# Patient Record
Sex: Female | Born: 1980 | Race: White | Hispanic: No | Marital: Single | Smoking: Never smoker
Health system: Southern US, Community
[De-identification: ages and names within clinical notes are randomized; demographics above are authoritative.]

## PROBLEM LIST (undated history)

## (undated) ENCOUNTER — Emergency Department (HOSPITAL_COMMUNITY): Admission: EM | Payer: 59 | Source: Home / Self Care

## (undated) DIAGNOSIS — N390 Urinary tract infection, site not specified: Secondary | ICD-10-CM

## (undated) DIAGNOSIS — F4310 Post-traumatic stress disorder, unspecified: Secondary | ICD-10-CM

## (undated) DIAGNOSIS — E559 Vitamin D deficiency, unspecified: Secondary | ICD-10-CM

## (undated) DIAGNOSIS — K589 Irritable bowel syndrome without diarrhea: Secondary | ICD-10-CM

## (undated) DIAGNOSIS — E119 Type 2 diabetes mellitus without complications: Secondary | ICD-10-CM

## (undated) DIAGNOSIS — G8929 Other chronic pain: Secondary | ICD-10-CM

## (undated) DIAGNOSIS — R0600 Dyspnea, unspecified: Secondary | ICD-10-CM

## (undated) DIAGNOSIS — K829 Disease of gallbladder, unspecified: Secondary | ICD-10-CM

## (undated) DIAGNOSIS — F329 Major depressive disorder, single episode, unspecified: Secondary | ICD-10-CM

## (undated) DIAGNOSIS — R600 Localized edema: Secondary | ICD-10-CM

## (undated) DIAGNOSIS — K859 Acute pancreatitis without necrosis or infection, unspecified: Secondary | ICD-10-CM

## (undated) DIAGNOSIS — N289 Disorder of kidney and ureter, unspecified: Secondary | ICD-10-CM

## (undated) DIAGNOSIS — N200 Calculus of kidney: Secondary | ICD-10-CM

## (undated) DIAGNOSIS — T7422XA Child sexual abuse, confirmed, initial encounter: Secondary | ICD-10-CM

## (undated) DIAGNOSIS — R12 Heartburn: Secondary | ICD-10-CM

## (undated) DIAGNOSIS — E282 Polycystic ovarian syndrome: Secondary | ICD-10-CM

## (undated) DIAGNOSIS — F419 Anxiety disorder, unspecified: Secondary | ICD-10-CM

## (undated) DIAGNOSIS — F319 Bipolar disorder, unspecified: Secondary | ICD-10-CM

## (undated) DIAGNOSIS — Z7289 Other problems related to lifestyle: Secondary | ICD-10-CM

## (undated) DIAGNOSIS — M539 Dorsopathy, unspecified: Secondary | ICD-10-CM

## (undated) DIAGNOSIS — M549 Dorsalgia, unspecified: Secondary | ICD-10-CM

## (undated) DIAGNOSIS — F32A Depression, unspecified: Secondary | ICD-10-CM

## (undated) DIAGNOSIS — G43909 Migraine, unspecified, not intractable, without status migrainosus: Secondary | ICD-10-CM

## (undated) DIAGNOSIS — K5900 Constipation, unspecified: Secondary | ICD-10-CM

## (undated) DIAGNOSIS — M2550 Pain in unspecified joint: Secondary | ICD-10-CM

## (undated) DIAGNOSIS — E669 Obesity, unspecified: Secondary | ICD-10-CM

## (undated) DIAGNOSIS — M797 Fibromyalgia: Secondary | ICD-10-CM

## (undated) DIAGNOSIS — T7840XA Allergy, unspecified, initial encounter: Secondary | ICD-10-CM

## (undated) HISTORY — DX: Irritable bowel syndrome, unspecified: K58.9

## (undated) HISTORY — DX: Disease of gallbladder, unspecified: K82.9

## (undated) HISTORY — DX: Other problems related to lifestyle: Z72.89

## (undated) HISTORY — DX: Major depressive disorder, single episode, unspecified: F32.9

## (undated) HISTORY — DX: Child sexual abuse, confirmed, initial encounter: T74.22XA

## (undated) HISTORY — PX: WISDOM TOOTH EXTRACTION: SHX21

## (undated) HISTORY — PX: OTHER SURGICAL HISTORY: SHX169

## (undated) HISTORY — PX: ANTERIOR FUSION CERVICAL SPINE: SUR626

## (undated) HISTORY — DX: Allergy, unspecified, initial encounter: T78.40XA

## (undated) HISTORY — DX: Fibromyalgia: M79.7

## (undated) HISTORY — DX: Vitamin D deficiency, unspecified: E55.9

## (undated) HISTORY — DX: Bipolar disorder, unspecified: F31.9

## (undated) HISTORY — DX: Pain in unspecified joint: M25.50

## (undated) HISTORY — PX: CHOLECYSTECTOMY: SHX55

## (undated) HISTORY — DX: Post-traumatic stress disorder, unspecified: F43.10

## (undated) HISTORY — DX: Depression, unspecified: F32.A

## (undated) HISTORY — DX: Localized edema: R60.0

## (undated) HISTORY — DX: Calculus of kidney: N20.0

## (undated) HISTORY — DX: Type 2 diabetes mellitus without complications: E11.9

## (undated) HISTORY — DX: Acute pancreatitis without necrosis or infection, unspecified: K85.90

## (undated) HISTORY — PX: LUMBAR MICRODISCECTOMY: SHX99

## (undated) HISTORY — PX: LAPAROSCOPIC SIGMOID COLECTOMY: SHX5928

## (undated) HISTORY — DX: Anxiety disorder, unspecified: F41.9

## (undated) HISTORY — DX: Constipation, unspecified: K59.00

## (undated) HISTORY — DX: Other chronic pain: G89.29

## (undated) HISTORY — DX: Dorsopathy, unspecified: M53.9

## (undated) HISTORY — DX: Heartburn: R12

## (undated) HISTORY — DX: Dyspnea, unspecified: R06.00

## (undated) HISTORY — DX: Migraine, unspecified, not intractable, without status migrainosus: G43.909

## (undated) HISTORY — DX: Dorsalgia, unspecified: M54.9

---
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
## 2008-05-28 ENCOUNTER — Other Ambulatory Visit: Admission: RE | Admit: 2008-05-28 | Discharge: 2008-05-28 | Payer: Self-pay | Admitting: Obstetrics and Gynecology

## 2010-04-12 HISTORY — PX: OTHER SURGICAL HISTORY: SHX169

## 2010-05-03 ENCOUNTER — Encounter: Payer: Self-pay | Admitting: Obstetrics and Gynecology

## 2010-08-05 ENCOUNTER — Emergency Department (HOSPITAL_COMMUNITY): Payer: Managed Care, Other (non HMO)

## 2010-08-05 ENCOUNTER — Inpatient Hospital Stay (HOSPITAL_COMMUNITY)
Admission: EM | Admit: 2010-08-05 | Discharge: 2010-08-12 | DRG: 392 | Disposition: A | Discharge status: Home / Self Care | Payer: Managed Care, Other (non HMO) | Attending: Surgery | Admitting: Surgery

## 2010-08-05 DIAGNOSIS — K5732 Diverticulitis of large intestine without perforation or abscess without bleeding: Principal | ICD-10-CM | POA: Diagnosis present

## 2010-08-05 DIAGNOSIS — R112 Nausea with vomiting, unspecified: Secondary | ICD-10-CM | POA: Diagnosis present

## 2010-08-05 DIAGNOSIS — E119 Type 2 diabetes mellitus without complications: Secondary | ICD-10-CM | POA: Diagnosis present

## 2010-08-05 DIAGNOSIS — K589 Irritable bowel syndrome without diarrhea: Secondary | ICD-10-CM | POA: Diagnosis present

## 2010-08-05 DIAGNOSIS — E282 Polycystic ovarian syndrome: Secondary | ICD-10-CM | POA: Diagnosis present

## 2010-08-05 LAB — DIFFERENTIAL
Basophils Absolute: 0 10*3/uL (ref 0.0–0.1)
Basophils Relative: 0 % (ref 0–1)
Eosinophils Absolute: 0.1 10*3/uL (ref 0.0–0.7)
Eosinophils Relative: 0 % (ref 0–5)
Lymphocytes Relative: 8 % — ABNORMAL LOW (ref 12–46)
Lymphs Abs: 1.4 10*3/uL (ref 0.7–4.0)
Monocytes Absolute: 0.9 10*3/uL (ref 0.1–1.0)
Monocytes Relative: 5 % (ref 3–12)
Neutro Abs: 14.4 10*3/uL — ABNORMAL HIGH (ref 1.7–7.7)
Neutrophils Relative %: 86 % — ABNORMAL HIGH (ref 43–77)

## 2010-08-05 LAB — COMPREHENSIVE METABOLIC PANEL
Alkaline Phosphatase: 48 U/L (ref 39–117)
BUN: 10 mg/dL (ref 6–23)
Calcium: 8.2 mg/dL — ABNORMAL LOW (ref 8.4–10.5)
GFR calc non Af Amer: >60 mL/min (ref >60)
Glucose, Bld: 124 mg/dL — ABNORMAL HIGH (ref 70–99)
Potassium: 3.6 mEq/L (ref 3.5–5.1)
Total Protein: 7 g/dL (ref 6.0–8.3)

## 2010-08-05 LAB — CBC
HCT: 37.9 % (ref 36.0–46.0)
Hemoglobin: 12.7 g/dL (ref 12.0–15.0)
MCH: 28.5 pg (ref 26.0–34.0)
MCHC: 33.5 g/dL (ref 30.0–36.0)
MCV: 85.2 fL (ref 78.0–100.0)
Platelets: 319 10*3/uL (ref 150–400)
RBC: 4.45 MIL/uL (ref 3.87–5.11)
RDW: 14 % (ref 11.5–15.5)
WBC: 16.8 10*3/uL — ABNORMAL HIGH (ref 4.0–10.5)

## 2010-08-05 LAB — LIPASE, BLOOD: Lipase: 24 U/L (ref 11–59)

## 2010-08-05 MED ORDER — IOHEXOL 300 MG/ML  SOLN
100.0000 mL | Freq: Once | INTRAMUSCULAR | Status: AC | PRN
  Administered 2010-08-05: 100 mL via INTRAVENOUS

## 2010-08-06 LAB — CBC
HCT: 35.7 % — ABNORMAL LOW (ref 36.0–46.0)
Hemoglobin: 11.9 g/dL — ABNORMAL LOW (ref 12.0–15.0)
MCH: 28.5 pg (ref 26.0–34.0)
MCV: 85.4 fL (ref 78.0–100.0)
RBC: 4.18 MIL/uL (ref 3.87–5.11)

## 2010-08-06 LAB — URINALYSIS, ROUTINE W REFLEX MICROSCOPIC
Bilirubin Urine: NEGATIVE
Nitrite: NEGATIVE
Protein, ur: NEGATIVE mg/dL
Specific Gravity, Urine: >1.046 — ABNORMAL HIGH (ref 1.005–1.030)
Urobilinogen, UA: 0.2 mg/dL (ref 0.0–1.0)

## 2010-08-06 LAB — URINE MICROSCOPIC-ADD ON

## 2010-08-06 LAB — POCT PREGNANCY, URINE: Preg Test, Ur: NEGATIVE

## 2010-08-06 LAB — GLUCOSE, CAPILLARY: Glucose-Capillary: 112 mg/dL — ABNORMAL HIGH (ref 70–99)

## 2010-08-06 LAB — HEMOGLOBIN A1C
Hgb A1c MFr Bld: 6.1 % — ABNORMAL HIGH (ref <5.7)
Mean Plasma Glucose: 128 mg/dL — ABNORMAL HIGH (ref <117)

## 2010-08-07 LAB — GLUCOSE, CAPILLARY
Glucose-Capillary: 95 mg/dL (ref 70–99)
Glucose-Capillary: 105 mg/dL — ABNORMAL HIGH (ref 70–99)
Glucose-Capillary: 95 mg/dL (ref 70–99)

## 2010-08-07 LAB — BASIC METABOLIC PANEL
CO2: 21 mEq/L (ref 19–32)
Calcium: 8.4 mg/dL (ref 8.4–10.5)
Chloride: 107 mEq/L (ref 96–112)
Creatinine, Ser: 0.69 mg/dL (ref 0.4–1.2)
Glucose, Bld: 80 mg/dL (ref 70–99)

## 2010-08-07 LAB — CBC
HCT: 35.9 % — ABNORMAL LOW (ref 36.0–46.0)
Hemoglobin: 11.7 g/dL — ABNORMAL LOW (ref 12.0–15.0)
MCH: 28.1 pg (ref 26.0–34.0)
MCHC: 32.6 g/dL (ref 30.0–36.0)
RBC: 4.17 MIL/uL (ref 3.87–5.11)

## 2010-08-08 LAB — GLUCOSE, CAPILLARY: Glucose-Capillary: 98 mg/dL (ref 70–99)

## 2010-08-08 LAB — CBC
Hemoglobin: 11.7 g/dL — ABNORMAL LOW (ref 12.0–15.0)
MCH: 27.7 pg (ref 26.0–34.0)
MCHC: 32.3 g/dL (ref 30.0–36.0)
Platelets: 292 10*3/uL (ref 150–400)
RBC: 4.22 MIL/uL (ref 3.87–5.11)

## 2010-08-09 LAB — GLUCOSE, CAPILLARY
Glucose-Capillary: 98 mg/dL (ref 70–99)
Glucose-Capillary: 96 mg/dL (ref 70–99)

## 2010-08-10 LAB — GLUCOSE, CAPILLARY
Glucose-Capillary: 96 mg/dL (ref 70–99)
Glucose-Capillary: 127 mg/dL — ABNORMAL HIGH (ref 70–99)

## 2010-08-10 LAB — CBC
HCT: 36.9 % (ref 36.0–46.0)
Hemoglobin: 12.3 g/dL (ref 12.0–15.0)
MCHC: 33.3 g/dL (ref 30.0–36.0)
MCV: 85 fL (ref 78.0–100.0)
WBC: 6.3 10*3/uL (ref 4.0–10.5)

## 2010-08-11 LAB — GLUCOSE, CAPILLARY
Glucose-Capillary: 106 mg/dL — ABNORMAL HIGH (ref 70–99)
Glucose-Capillary: 98 mg/dL (ref 70–99)

## 2010-08-12 LAB — GLUCOSE, CAPILLARY
Glucose-Capillary: 119 mg/dL — ABNORMAL HIGH (ref 70–99)
Glucose-Capillary: 109 mg/dL — ABNORMAL HIGH (ref 70–99)

## 2010-08-14 NOTE — Discharge Summary (Signed)
  NAME:  Kerri Ford, Kerri Ford             ACCOUNT NO.:  [id number]  MEDICAL RECORD NO.:  [id number]           PATIENT TYPE:  I  LOCATION:  5155                         FACILITY:  [facility]  PHYSICIAN:  Wilmon Arms. Corliss Skains, M.D. DATE OF BIRTH:  1980/11/24  DATE OF ADMISSION:  08/05/2010 DATE OF DISCHARGE:  08/12/2010                              DISCHARGE SUMMARY   HISTORY OF PRESENT ILLNESS:  Kerri Ford is a pleasant 30 year old female who has had prior episodes of diverticulitis, treated with oral antibiotics, however, has never been admitted.  Due to the intensity of her recurrent pain, she came the emergency department where CT scan showed continued evidence of diverticulitis and of questionable small contained perforation.  She had an elevated white count of greater than 16,000.  She does have a history of irritable bowel disease and polycystic ovarian syndrome, but this had no previous admissions.  After being evaluated by Dr. Bertram Savin, decision was made to admit the patient for diverticulitis.  SUMMARY OF HOSPITAL COURSE:  The patient was admitted on August 06, 2010, by Dr. Bertram Savin.  She was placed on IV Cipro and Flagyl and placed at bowel rest.  Spent several days like this before feeling significantly better symptomatically.  She was still having some mild abdominal discomfort even 48 hours after being admitted.  Finally, she started to subside.  She was started on clear liquid diet and gradually advanced from clear liquids to full liquids to regular diet.  She was consulted on by the nutritionist for teaching a low residue diet which she will need to maintain after discharge.  She was switched from IV to p.o. antibiotics consisting of Augmentin and Flagyl.  She had one additional episode of nausea and vomiting with her breakfast when morning that prompted an additional 24 hours of observation, but the patient did well those following 24 hours, and therefore is appropriate for  discharge as of today, Aug 12, 2010.  DISCHARGE DIAGNOSES: 1. Recurrent sigmoid diverticulitis. 2. History of irritable bowel syndrome. 3. Diabetes mellitus. 4. Polycystic ovarian syndrome. 5. Morbid obesity.  DISCHARGE MEDICATIONS:  The patient will resume her home medications including: 1. Loratadine 10 mg daily. 2. Metformin 500 mg twice daily. 3. Trazodone 50 mg daily. 4. Benadryl 25 mg q.6 h p.r.n. 5. Percocet 5/325 one-two tablets q.4 h p.r.n. 6. She is given prescriptions for Augmentin 875 mg 1 tablet twice     daily and Flagyl 500 mg 1 tablet 3 times daily. 7. She is also given prescription Diflucan 150 mg 1 tablet q. 4-5 days     p.r.n. yeast infection.  She is given instructions regarding activity and dietary restrictions and is asked to follow up in our office with either Dr. Donell Beers or Dr. Abbey Chatters in approximately 10-14 days.     Brayton El, PA-C   ______________________________ Wilmon Arms. Corliss Skains, M.D.    Corky Downs  D:  08/12/2010  T:  08/12/2010  Job:  [id number]  Electronically Signed by Brayton El  on 08/13/2010 09:17:03 AM Electronically Signed by Manus Rudd M.D. on 08/14/2010 02:15:05 PM

## 2010-09-17 ENCOUNTER — Encounter (HOSPITAL_COMMUNITY)
Admission: RE | Admit: 2010-09-17 | Discharge: 2010-09-17 | Disposition: A | Discharge status: Home / Self Care | Payer: Managed Care, Other (non HMO) | Source: Ambulatory Visit | Attending: General Surgery | Admitting: General Surgery

## 2010-09-17 LAB — URINALYSIS, ROUTINE W REFLEX MICROSCOPIC
Ketones, ur: NEGATIVE mg/dL
Nitrite: NEGATIVE
Protein, ur: NEGATIVE mg/dL
Urobilinogen, UA: 0.2 mg/dL (ref 0.0–1.0)

## 2010-09-17 LAB — CBC
MCV: 85.8 fL (ref 78.0–100.0)
Platelets: 330 10*3/uL (ref 150–400)
RDW: 14.1 % (ref 11.5–15.5)
WBC: 9.7 10*3/uL (ref 4.0–10.5)

## 2010-09-17 LAB — COMPREHENSIVE METABOLIC PANEL
Albumin: 3.9 g/dL (ref 3.5–5.2)
Alkaline Phosphatase: 59 U/L (ref 39–117)
BUN: 12 mg/dL (ref 6–23)
Chloride: 103 mEq/L (ref 96–112)
Potassium: 4.3 mEq/L (ref 3.5–5.1)
Total Bilirubin: 0.2 mg/dL — ABNORMAL LOW (ref 0.3–1.2)

## 2010-09-17 LAB — DIFFERENTIAL
Basophils Absolute: 0 10*3/uL (ref 0.0–0.1)
Basophils Relative: 0 % (ref 0–1)
Eosinophils Absolute: 0.2 10*3/uL (ref 0.0–0.7)
Eosinophils Relative: 2 % (ref 0–5)

## 2010-09-17 LAB — SURGICAL PCR SCREEN: Staphylococcus aureus: NEGATIVE

## 2010-09-17 LAB — HCG, SERUM, QUALITATIVE: Preg, Serum: NEGATIVE

## 2010-09-17 LAB — PROTIME-INR
INR: 0.97 (ref 0.00–1.49)
Prothrombin Time: 13.1 seconds (ref 11.6–15.2)

## 2010-09-17 LAB — TYPE AND SCREEN

## 2010-09-23 ENCOUNTER — Other Ambulatory Visit (INDEPENDENT_AMBULATORY_CARE_PROVIDER_SITE_OTHER): Payer: Self-pay | Admitting: General Surgery

## 2010-09-23 ENCOUNTER — Inpatient Hospital Stay (HOSPITAL_COMMUNITY)
Admission: RE | Admit: 2010-09-23 | Discharge: 2010-09-27 | DRG: 331 | Disposition: A | Discharge status: Home / Self Care | Payer: Managed Care, Other (non HMO) | Source: Ambulatory Visit | Attending: General Surgery | Admitting: General Surgery

## 2010-09-23 DIAGNOSIS — Z01818 Encounter for other preprocedural examination: Secondary | ICD-10-CM

## 2010-09-23 DIAGNOSIS — K5732 Diverticulitis of large intestine without perforation or abscess without bleeding: Principal | ICD-10-CM | POA: Diagnosis present

## 2010-09-23 DIAGNOSIS — Z01812 Encounter for preprocedural laboratory examination: Secondary | ICD-10-CM

## 2010-09-24 ENCOUNTER — Inpatient Hospital Stay (HOSPITAL_COMMUNITY): Payer: Managed Care, Other (non HMO)

## 2010-09-24 LAB — BASIC METABOLIC PANEL
CO2: 25 mEq/L (ref 19–32)
Chloride: 103 mEq/L (ref 96–112)
GFR calc Af Amer: >60 mL/min (ref >60)
Potassium: 4.2 mEq/L (ref 3.5–5.1)
Sodium: 136 mEq/L (ref 135–145)

## 2010-09-24 LAB — URINALYSIS, ROUTINE W REFLEX MICROSCOPIC
Nitrite: NEGATIVE
Protein, ur: 100 mg/dL — AB
Specific Gravity, Urine: 1.046 — ABNORMAL HIGH (ref 1.005–1.030)
Urobilinogen, UA: 0.2 mg/dL (ref 0.0–1.0)

## 2010-09-24 LAB — URINE MICROSCOPIC-ADD ON

## 2010-09-24 LAB — CBC
HCT: 35.2 % — ABNORMAL LOW (ref 36.0–46.0)
Hemoglobin: 11.8 g/dL — ABNORMAL LOW (ref 12.0–15.0)
MCH: 28.9 pg (ref 26.0–34.0)
MCHC: 33.5 g/dL (ref 30.0–36.0)

## 2010-09-24 LAB — PHOSPHORUS: Phosphorus: 3.1 mg/dL (ref 2.3–4.6)

## 2010-09-24 LAB — MAGNESIUM: Magnesium: 2.3 mg/dL (ref 1.5–2.5)

## 2010-09-25 LAB — BASIC METABOLIC PANEL
BUN: 9 mg/dL (ref 6–23)
CO2: 29 mEq/L (ref 19–32)
GFR calc non Af Amer: >60 mL/min (ref >60)
Glucose, Bld: 101 mg/dL — ABNORMAL HIGH (ref 70–99)
Potassium: 4.2 mEq/L (ref 3.5–5.1)
Sodium: 137 mEq/L (ref 135–145)

## 2010-09-25 LAB — URINE CULTURE
Colony Count: NO GROWTH
Culture  Setup Time: 201206141435
Culture: NO GROWTH

## 2010-09-25 LAB — CBC
HCT: 32.9 % — ABNORMAL LOW (ref 36.0–46.0)
Hemoglobin: 10.6 g/dL — ABNORMAL LOW (ref 12.0–15.0)
MCHC: 32.2 g/dL (ref 30.0–36.0)
RBC: 3.75 MIL/uL — ABNORMAL LOW (ref 3.87–5.11)

## 2010-09-25 LAB — PHOSPHORUS: Phosphorus: 2.6 mg/dL (ref 2.3–4.6)

## 2010-09-27 ENCOUNTER — Emergency Department (HOSPITAL_COMMUNITY): Payer: Managed Care, Other (non HMO)

## 2010-09-27 ENCOUNTER — Inpatient Hospital Stay (HOSPITAL_COMMUNITY)
Admission: EM | Admit: 2010-09-27 | Discharge: 2010-09-30 | DRG: 948 | Disposition: A | Discharge status: Home / Self Care | Payer: Managed Care, Other (non HMO) | Attending: Surgery | Admitting: Surgery

## 2010-09-27 DIAGNOSIS — R109 Unspecified abdominal pain: Secondary | ICD-10-CM | POA: Diagnosis present

## 2010-09-27 DIAGNOSIS — E282 Polycystic ovarian syndrome: Secondary | ICD-10-CM | POA: Diagnosis present

## 2010-09-27 DIAGNOSIS — F341 Dysthymic disorder: Secondary | ICD-10-CM | POA: Diagnosis present

## 2010-09-27 DIAGNOSIS — K589 Irritable bowel syndrome without diarrhea: Secondary | ICD-10-CM | POA: Diagnosis present

## 2010-09-27 DIAGNOSIS — E119 Type 2 diabetes mellitus without complications: Secondary | ICD-10-CM | POA: Diagnosis present

## 2010-09-27 DIAGNOSIS — Y836 Removal of other organ (partial) (total) as the cause of abnormal reaction of the patient, or of later complication, without mention of misadventure at the time of the procedure: Secondary | ICD-10-CM | POA: Diagnosis present

## 2010-09-27 DIAGNOSIS — G8918 Other acute postprocedural pain: Principal | ICD-10-CM | POA: Diagnosis present

## 2010-09-27 DIAGNOSIS — Z91040 Latex allergy status: Secondary | ICD-10-CM

## 2010-09-27 DIAGNOSIS — K5730 Diverticulosis of large intestine without perforation or abscess without bleeding: Secondary | ICD-10-CM | POA: Diagnosis present

## 2010-09-27 DIAGNOSIS — Y92009 Unspecified place in unspecified non-institutional (private) residence as the place of occurrence of the external cause: Secondary | ICD-10-CM

## 2010-09-28 LAB — POCT I-STAT, CHEM 8
Calcium, Ion: 1.17 mmol/L (ref 1.12–1.32)
Creatinine, Ser: 0.8 mg/dL (ref 0.50–1.10)
Glucose, Bld: 113 mg/dL — ABNORMAL HIGH (ref 70–99)
HCT: 38 % (ref 36.0–46.0)
Hemoglobin: 12.9 g/dL (ref 12.0–15.0)
Potassium: 4.4 mEq/L (ref 3.5–5.1)
TCO2: 28 mmol/L (ref 0–100)

## 2010-09-28 LAB — URINALYSIS, ROUTINE W REFLEX MICROSCOPIC
Glucose, UA: NEGATIVE mg/dL
Ketones, ur: NEGATIVE mg/dL
Leukocytes, UA: NEGATIVE
Protein, ur: NEGATIVE mg/dL
Urobilinogen, UA: 0.2 mg/dL (ref 0.0–1.0)

## 2010-09-28 LAB — CBC
HCT: 34.7 % — ABNORMAL LOW (ref 36.0–46.0)
MCHC: 34.3 g/dL (ref 30.0–36.0)
Platelets: 288 10*3/uL (ref 150–400)
RDW: 14 % (ref 11.5–15.5)
WBC: 11.5 10*3/uL — ABNORMAL HIGH (ref 4.0–10.5)

## 2010-09-28 LAB — GLUCOSE, CAPILLARY: Glucose-Capillary: 113 mg/dL — ABNORMAL HIGH (ref 70–99)

## 2010-09-28 LAB — DIFFERENTIAL
Basophils Absolute: 0 10*3/uL (ref 0.0–0.1)
Basophils Relative: 0 % (ref 0–1)
Eosinophils Absolute: 0.1 10*3/uL (ref 0.0–0.7)
Eosinophils Relative: 1 % (ref 0–5)
Lymphocytes Relative: 15 % (ref 12–46)
Monocytes Absolute: 0.6 10*3/uL (ref 0.1–1.0)

## 2010-09-28 NOTE — H&P (Signed)
  NAME:  Kerri Ford, NUSSBAUMER             ACCOUNT NO.:  [id number]  MEDICAL RECORD NO.:  [id number]           PATIENT TYPE:  LOCATION:                                 FACILITY:  PHYSICIAN:  Lennie Muckle, MD      DATE OF BIRTH:  08-18-1980  DATE OF ADMISSION:  08/05/2010 DATE OF DISCHARGE:                             HISTORY & PHYSICAL   DIAGNOSIS:  Diverticulitis.  The patient is a 30 year old female who apparently had onset of left lower abdominal pain on Wednesday morning.  The pain was located in the left lower quadrant.  The pain was severe.  She had associated nausea, but no emesis.  No diarrhea.  She had some chills.  She had had a previous history of diverticulitis, treated with oral antibiotics in the past.  Due to the intensity of pain, she came to the emergency department.  A CT scan was obtained, which revealed a diverticulitis, inflammatory changes, and small perforation was contained.  She did have an elevated white count of 16.8.  She states she has had five episodes within the past 5 years.  She does have irritable bowel syndrome, but has not had any previous admissions.  PAST MEDICAL HISTORY:  Morbid obesity, polycystic ovarian syndrome, questionable diabetes.  She states she is prediabetic.  She does have irritable bowel syndrome.  SURGICAL HISTORY:  Cholecystectomy, right ankle surgery.  MEDICATIONS:  Metformin.  ALLERGIES:  LATEX causes rash.  REVIEW OF SYSTEMS:  Negative.  PHYSICAL EXAMINATION:  GENERAL:  She is a morbidly obese female, lying in bed, in no acute distress. VITAL SIGNS:  Temperature is 99.1, pulse 99, blood pressure 139/82. HEENT:  Normocephalic.  Extraocular muscles are intact.  Pupils are equal and round.  Sclerae and conjunctivae are clear.  Nares are clear without drainage.  Oral mucosa is pink and moist. NECK:  No tenderness.  Normal range of motion. CHEST:  Clear to auscultation bilaterally.  Normal expansion  and excursion. CARDIOVASCULAR:  Regular rate and rhythm.  No murmurs, gallops, or rubs. Pulses are palpated in the upper extremities. ABDOMEN:  Obese.  Examination is severely limited due to body habitus, but she has some tenderness in the left lower quadrant.  No rebound tenderness.  No peritoneal signs. MUSCULOSKELETAL:  No pain with palpation of the joints.  Normal range of motion. NEUROLOGIC:  Grossly normal. PSYCHOLOGIC:  Normal.  ASSESSMENT AND PLAN:  Diverticulitis with perforation.  We will admit. IV antibiotics.  Continue serial examinations for change in examination. Currently, there is no acute need for surgical intervention.  I have discussed the possibility of surgery with __________ if examination does change.  She is somewhat limited due to her body habitus.  We will continue most of her home medications and place her on Cipro and Flagyl. Maintain n.p.o. status.     Lennie Muckle, MD     ALA/MEDQ  D:  08/06/2010  T:  08/06/2010  Job:  [id number]  cc:   Pearline Cables, MD  Electronically Signed by Bertram Savin MD on 09/28/2010 12:02:30 PM

## 2010-09-29 ENCOUNTER — Inpatient Hospital Stay (HOSPITAL_COMMUNITY): Payer: Managed Care, Other (non HMO)

## 2010-09-29 LAB — GLUCOSE, CAPILLARY
Glucose-Capillary: 84 mg/dL (ref 70–99)
Glucose-Capillary: 100 mg/dL — ABNORMAL HIGH (ref 70–99)
Glucose-Capillary: 105 mg/dL — ABNORMAL HIGH (ref 70–99)

## 2010-09-29 LAB — CBC
MCHC: 33.5 g/dL (ref 30.0–36.0)
RDW: 13.9 % (ref 11.5–15.5)
WBC: 12.5 10*3/uL — ABNORMAL HIGH (ref 4.0–10.5)

## 2010-09-29 MED ORDER — IOHEXOL 300 MG/ML  SOLN
80.0000 mL | Freq: Once | INTRAMUSCULAR | Status: AC | PRN
  Administered 2010-09-29: 80 mL via INTRAVENOUS

## 2010-09-30 LAB — GLUCOSE, CAPILLARY
Glucose-Capillary: 97 mg/dL (ref 70–99)
Glucose-Capillary: 108 mg/dL — ABNORMAL HIGH (ref 70–99)

## 2010-09-30 LAB — CBC
MCHC: 34.2 g/dL (ref 30.0–36.0)
RDW: 14 % (ref 11.5–15.5)

## 2010-10-07 ENCOUNTER — Telehealth (INDEPENDENT_AMBULATORY_CARE_PROVIDER_SITE_OTHER): Payer: Self-pay | Admitting: General Surgery

## 2010-10-07 NOTE — Telephone Encounter (Signed)
Pt called - stated has appt Friday / just concerned that staples might need come out sooner/ stated a friend (doctor ) look at and said was not infected . Pt has no fever . She had been using peroxide  So i instructed pt to discontinue peroxide and just keep clean and dry until Friday and will call if symptoms  Get worse/ [phone]--- 06/27/12e3h

## 2010-10-09 ENCOUNTER — Encounter (INDEPENDENT_AMBULATORY_CARE_PROVIDER_SITE_OTHER): Payer: Self-pay | Admitting: General Surgery

## 2010-10-09 ENCOUNTER — Ambulatory Visit (INDEPENDENT_AMBULATORY_CARE_PROVIDER_SITE_OTHER): Payer: Managed Care, Other (non HMO) | Admitting: General Surgery

## 2010-10-09 VITALS — HR 72 | Temp 98.0°F | Wt 259.6 lb

## 2010-10-09 DIAGNOSIS — K5732 Diverticulitis of large intestine without perforation or abscess without bleeding: Secondary | ICD-10-CM

## 2010-10-09 NOTE — Assessment & Plan Note (Signed)
Staples removed. Approved for driving. No lifting over 20 pounds for 6 weeks after surgery. Follow up PRN.

## 2010-10-09 NOTE — Progress Notes (Signed)
Subjective:     Patient ID: Kerri Ford, female   DOB: [birth year], 30 y.o.   MRN: [id number]    Pulse 72  Temp 98 F (36.7 C)  Wt 259 lb 9.6 oz (117.754 kg)  LMP 09/24/2010    HPI  The patient is doing very well after her laparoscopic sigmoid colectomy. She had to be admitted once while I was on vacation for ileus and constipation. She has continued to wean her narcotics and is only taking around 1-2 per day. She has not taken any today. She is walking multiple times per day. Her appetite is slowly improving. Her energy level continues to improve. She denies any fevers, chills, nausea, vomiting. She has had some irritation from the staple sites. Review of Systems     Objective:   Physical Exam In general she is oriented x3 in no acute distress Abdomen soft nontender nondistended. Staple sites are inflamed and the staples are removed and replaced with Steri-Strips.    Assessment:       Plan:

## 2010-10-11 NOTE — Op Note (Signed)
NAME:  Kerri Ford, Kerri Ford             ACCOUNT NO.:  [id number]  MEDICAL RECORD NO.:  [id number]  LOCATION:  5127                         FACILITY:  [facility]  PHYSICIAN:  Almond Lint, MD       DATE OF BIRTH:  [birth year]  DATE OF PROCEDURE:  09/23/2010 DATE OF DISCHARGE:                              OPERATIVE REPORT   PREOPERATIVE DIAGNOSIS:  Recurrent diverticulitis.  POSTOPERATIVE DIAGNOSIS:  Recurrent diverticulitis.  PROCEDURE:  Laparoscopic sigmoid colectomy with splenic flexure takedown.  SURGEON:  Almond Lint, MD  ASSISTANT:  Derrell Lolling.  ANESTHESIA:  General and local.  FINDINGS:  Sigmoid diverticulitis.  SPECIMENS:  Sigmoid to Pathology.  ESTIMATED BLOOD LOSS:  Minimal.  COMPLICATIONS:  None known.  ESTIMATED BLOOD LOSS:  100 mL.  COMPLICATIONS:  None known.  PROCEDURE IN DETAILS:  Kerri Ford was identified in the holding area and taken to the operating room where she was placed supine on the operating room table.  General anesthesia was induced.  She was placed into the low lithotomy position and a Foley catheter was placed.  Her abdomen and perineum were prepped and draped in sterile fashion.  Time- out was performed according to the surgical safety check list.  When all was correct we continued.  The infraumbilical skin was anesthetized with local anesthetic and a vertical incision was made with #11 blade.  The Kerri Ford was used to spread the subcutaneous tissues and the midline fascia was elevated with 2 Kocher clamps.  The fascia was incised in the midline with #11 blade and a 0 Vicryl pursestring suture was placed around the incision.  The Hasson trocar was advanced into the abdomen and pneumoperitoneum was achieved to a pressure of 15 mmHg.  The patient was placed in Trendelenburg position and rotated to the left.  Three 5-mm trocars were placed under direct visualization after administration of local, one in the left lower quadrant, one in the right lower  quadrant, and one in the suprapubic region.  The sigmoid was identified and the attachments to the abdominal wall were originally taken down with scissors.  There was chronic inflammation in this area. The white line of Toldt was taken down with the scissors as well.  Once we got closer down to the line of Toldt at the sigmoid, the Harmonic was used.  Blunt dissection was used as well.  The ureter was identified, visualized and held down.  The tissue above that was quite inflamed was taken out to the lateral abdominal wall.  The sigmoid inferiorly was quite mobile, so attention was directed superiorly.  The splenic flexure was taken down by going all the way up to the white line of Toldt then taking down the splenic colic ligament.  The omentum was taken off of the splenic flexure as well and the splenic flexure was reflected medially.  The posterior attachments were taken down with a combination of blunt dissection with LigaSure.  Once adequate length had been obtained to pull this down, the sigmoid was held with a locking grasper and a midline incision was made for an elective wound retractor.  This was around 10-12 cm in length.  The skin was incised with a #10 blade and  the cautery was used to divide the subcutaneous tissues.  Fascia was opened with the cautery and taken down to the length of skin incision. The peritoneum was opened to the full length of the incision as well. The sigmoid was pulled out into the incision and the appropriate sites for division were identified.  Proximal to the inflammation of the diverticuli, area was selected in the distal descending colon.  The mesentery was divided bluntly and then this was stapled with the GIA 75. A Tanja Port was used to hold the tinea epiploica so that the colon did not retract through the abdomen.  Distally, the proximal rectum was identified where the tinea had started to fan out and there were no additional diverticuli present.   This was divided as well with a GIA 75. The mesocolon was divided with the LigaSure.  The two ends of the colon were pulled together with stay sutures and the stapled off ends were opened.  A single layer interrupted 3-0 Vicryl anastomosis was created in an end-to-end fashion.  The rectum was insufflated with a spring clamp proximal to the anastomosis.  This was done under a layer of irrigation so that we could test for bubbles.  There was no evidence of leak present.  The air was allowed to evacuate from the rectum.  The abdomen was irrigated and gloves were changed of the surgeon's and assistant's.  Majority of the instruments were taken away.  A Seprafilm was placed into the abdomen and the On-Q pain pump catheters were placed in the preperitoneal space.  The peritoneum was then closed with a 0 running Vicryl.  The fascia was then closed with #1 loop PDS sutures. The skin was irrigated copiously and closed with staples.  Prior to complete closure of the midline incision, the Hasson site was closed and there was no evidence of fascial defect.  The abdomen was reinsufflated and there was no evidence of bleeding in any of the 4 quadrants.  There was no evidence of pulling blood.  There was no other gross pathology noted.  The pneumoperitoneum was allowed to evacuate and the remaining trocars were pulled under direct visualization without bleeding from the abdominal wall.  Remainder of the incisions were closed with staples and then cleaned, dried and dressed with gauze and Tegaderm.  The patient was awakened from anesthesia and taken to PACU in stable condition. Needle, sponge and instrument counts were correct.     Almond Lint, MD     FB/MEDQ  D:  09/23/2010  T:  09/24/2010  Job:  [id number]  Electronically Signed by Almond Lint MD on 10/11/2010 12:45:10 PM

## 2010-10-20 NOTE — H&P (Signed)
NAME:  Kerri Ford, Kerri Ford NO.:  [id number]  MEDICAL RECORD NO.:  [id number]  LOCATION:  [facility]                         FACILITY:  [facility]  PHYSICIAN:  Velora Heckler, MD      DATE OF BIRTH:  Feb 05, 1981  DATE OF ADMISSION:  09/27/2010 DATE OF DISCHARGE:                             HISTORY & PHYSICAL   REASON FOR EVALUATION:  Abdominal pain out of control.  BRIEF HISTORY:  The patient is a 29 year old female who underwent sigmoid colectomy September 23, 2010 by Dr. Almond Lint.  She was discharged home yesterday.  Since discharge, she reports the pain was severe.  She could not take it at home.  She felt dizzy when she got up to walk, felt anxious  and  was worried if she had ever done something to hurt her surgical site.  She came back to the ER and was treated with p.o. and IV Dilaudid and Ambien and she has had a good night, but continues to feel her pain is out of control.  She also noted pain while trying to void and have a bowel movement.  PAST MEDICAL HISTORY: 1. Recurrent diverticulitis. 2. Irritable bowel syndrome. 3. Adult-onset diabetes mellitus, non-insulin dependent. 4. History of polycystic ovarian syndrome. 5. Morbid obesity.  PAST SURGICAL HISTORY: 1. Cholecystectomy in 2005. 2. History of a right ankle surgery.  FAMILY HISTORY:  Noncontributory.  SOCIAL HISTORY:  Tobacco none.  Alcohol rare.  Drugs none.  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  REVIEW OF SYSTEMS:  Ten-point review was negative except for some anxiety and depression, dizziness, and presyncope when she was up walking.  CURRENT MEDICATIONS:  Her discharge instruction sheet lists: 1. Percocet 1-2 q.4 h. P.r.n. 2. Vitamin B complex with C 1 daily. 3. Tylenol 500 mg 1-2 q.4 p.r.n. 4. Trazodone 50 mg at bedtime. 5. Potassium citrate 1 daily. 6. Metformin 500 mg b.i.d. 7. Magnesium oxide 1 p.o. daily. 8. Benadryl 25 mg 2 tablets q.6 p.r.n. 9. Azo Yeast OTC 1-3  tablets daily as needed.  ALLERGIES: 1. LATEX causes redness, itching and a cough. 2. ARTIFICIAL SWEETENERS "tears up her stomach."  PHYSICAL EXAMINATION:  GENERAL:  This is an obese white female, in no acute distress. VITAL SIGNS:  Temperature on admission was 99.4, heart rate 109, blood pressure 132/82, respiratory rate is 20, sats of 99% on room air. HEENT:  Normocephalic.  Ears, nose, throat and mouth within normal limits. NECK:  Trachea is in the midline.  Thyroid is nonpalpable. CHEST:  Clear to auscultation and percussion.  Nontender. CARDIAC:  Normal S1 and S2.  No murmur or rub. ABDOMEN:  Bowel sounds are somewhat hypoactive.  She is not distended. She is slightly tender.  Incision staples are okay with some slight erythema but nothing abnormal. GU/RECTAL:  Deferred. LYMPHADENOPATHY:  None palpated. MUSCULOSKELETAL:  Normal. SKIN:  Normal. NEUROLOGIC:  Cranial nerves are intact.  No focal deficits. PSYCH:  Normal.  She is slightly anxious.  LABORATORY DATA:  White count is 11.5, hemoglobin is 11.9, hematocrit is 34.7, platelets are 288,000.  Sodium is 137, potassium is 4.4, chloride is 99, BUN is 8, creatinine is 0.8, glucose  is 113.  IMPRESSION: 1. Status post sigmoid colectomy September 23, 2010.  Discharged yesterday,     September 27, 2010 with poor pain control, now readmitted. 2. Adult-onset diabetes mellitus. 3. Anxiety/depression. 4. Morbid obesity.  Height 63 inches, weight 266 pounds.  PLAN:  We will admit her, work on her pain control, and mobilize. Further treatment as indicated.     Eber Hong, P.A.   ______________________________ Velora Heckler, MD    WDJ/MEDQ  D:  09/28/2010  T:  09/28/2010  Job:  [id number]  cc:   Georgian Co, PA  Electronically Signed by Sherrie George P.A. on 10/05/2010 04:54:09 PM Electronically Signed by Darnell Level MD on 10/20/2010 10:57:57 AM

## 2010-12-03 NOTE — Discharge Summary (Signed)
  NAME:  Kerri Ford, Kerri Ford             ACCOUNT NO.:  [id number]  MEDICAL RECORD NO.:  [id number]  LOCATION:                                 FACILITY:  PHYSICIAN:  Almond Lint, MD       DATE OF BIRTH:  1980-04-26  DATE OF ADMISSION:  09/23/2010 DATE OF DISCHARGE:  09/23/2010                              DISCHARGE SUMMARY   PRINCIPAL DIAGNOSIS:  Recurrent diverticulitis.  SECONDARY DIAGNOSES:  Polycystic ovarian syndrome, obesity.  PRINCIPAL LABS:  Discharge hematocrit 39, blood sugars l30.  DISCHARGE MEDICATIONS: 1. Metformin 500 mg twice a day. 2. Potassium citrate 1 daily. 3. Magnesium oxide 1 p.o. daily. 4. Vitamin B complex with vitamin C once a day. 5. Trazodone 50 mg at bedtime. 6. Benadryl as needed. 7. Azo Yeast as needed. 8. Percocet 5/325, 1-2 tabs p.o. q.4 h. as needed for pain.  HOSPITAL COURSE:  [name] is admitted to the hospital following her laparoscopic sigmoid colectomy.  She did a good job ambulating.  She was on PCA and had On-Q for pain control.  Her Foley catheter was removed.  She was able to void spontaneously.  She had good bowel function and was able to tolerate a regular diet.  She was placed on Percocet and her pain was tolerable.  She was discharged to home in improved condition.  She is instructed not to do any heavy lifting for 6 weeks.  She can shower but not bathe.  Regular diet.  Follow up with me in 2-3 weeks.     Almond Lint, MD     FB/MEDQ  D:  11/26/2010  T:  11/26/2010  Job:  [id number]  Electronically Signed by Almond Lint MD on 12/03/2010 02:37:32 PM

## 2011-06-02 ENCOUNTER — Other Ambulatory Visit: Payer: Self-pay | Admitting: Physician Assistant

## 2011-06-02 MED ORDER — TRAZODONE HCL 50 MG PO TABS: ORAL_TABLET | ORAL | Status: DC

## 2011-06-02 MED ORDER — BUPROPION HCL ER (SR) 150 MG PO TB12: ORAL_TABLET | ORAL | Status: DC

## 2011-06-02 MED ORDER — METFORMIN HCL 500 MG PO TABS: 500.0000 mg | ORAL_TABLET | Freq: Two times a day (BID) | ORAL | Status: DC

## 2011-08-10 ENCOUNTER — Ambulatory Visit: Payer: Self-pay | Admitting: Physician Assistant

## 2011-08-10 VITALS — BP 112/75 | HR 103 | Temp 98.2°F | Resp 18 | Ht 64.0 in | Wt 280.0 lb

## 2011-08-10 DIAGNOSIS — R110 Nausea: Secondary | ICD-10-CM

## 2011-08-10 DIAGNOSIS — E282 Polycystic ovarian syndrome: Secondary | ICD-10-CM

## 2011-08-10 DIAGNOSIS — R112 Nausea with vomiting, unspecified: Secondary | ICD-10-CM

## 2011-08-10 DIAGNOSIS — F419 Anxiety disorder, unspecified: Secondary | ICD-10-CM

## 2011-08-10 DIAGNOSIS — R197 Diarrhea, unspecified: Secondary | ICD-10-CM

## 2011-08-10 MED ORDER — BUPROPION HCL ER (SR) 150 MG PO TB12: ORAL_TABLET | ORAL | Status: DC

## 2011-08-10 MED ORDER — CLONAZEPAM 0.5 MG PO TABS: ORAL_TABLET | ORAL | Status: DC

## 2011-08-11 DIAGNOSIS — E282 Polycystic ovarian syndrome: Secondary | ICD-10-CM | POA: Insufficient documentation

## 2011-08-11 NOTE — Progress Notes (Signed)
  Subjective:    Patient ID: Kerri Ford, female    DOB: October 06, 1980, 31 y.o.   MRN: [id number]  HPI 31 yr old CF presents with N/V/D all day today.  No f/c.  Abs sore from heaving. No dysuria or frequency.  Also updates me on other things in her life.  She has continued counseling since seeing me last summer and is having major breakthroughs.  She has great support system inplace through her church.  She has had a few panic attacks related to some painful memories resurfacing and would like to have something on hand if she experiences these.   She admits to poor diet and plans to get back on track with that  She denies SI/HI.    She is working for  A financial group entering data.  Doesn't have health insurance currently.  Review of Systems  All other systems reviewed and are negative.       Objective:   Physical Exam  Nursing note and vitals reviewed. Constitutional: She appears well-developed and well-nourished.       Obese, lying on table, appears ill, but not toxic.  HENT:  Head: Normocephalic and atraumatic.  Mouth/Throat: Oropharynx is clear and moist. No oropharyngeal exudate.  Neck: Normal range of motion. Neck supple.  Cardiovascular: Normal rate, regular rhythm and normal heart sounds.   Pulmonary/Chest: Effort normal and breath sounds normal.  Abdominal: Soft. Bowel sounds are normal. She exhibits no distension and no mass. There is no tenderness. There is no rebound and no guarding.  Skin: Skin is warm and dry.  Psychiatric: She has a normal mood and affect.   Gave 2 p.o. Zofran 4mg  at beginning of visit       Assessment & Plan:  Kerri Ford has resolved upon end of visit, but I did give her #6 phenergan from our stock bottle to have on hand 1 tab every 6 hrs as needed for nausea.  She can try immodium if needed. Discussed hydration at length. Rest X24 hrs. Anxiety/panic attacks-Increase wellbutrin to 1 1/2 tabs qd and clonazepam was given to have  on hand.

## 2011-08-13 ENCOUNTER — Telehealth: Payer: Self-pay

## 2011-08-13 NOTE — Telephone Encounter (Signed)
PATIENT STATES SHE WAS SEEN TUES. BY ANGELA MCCLUNG FOR A STOMACH VIRUS. TODAY HER STOMACH IS STILL GASSY AND SHE HAS THIS TERRIBLE DIARRHEA. SHE ALSO DOES NOT FEEL LIKE EATING ANYTHING. WHAT SHOULD SHE DO NEXT? BEST PHONE [phone] (CELL)    PHARMACY CHOICE IS [facility] ([address])       [facility]

## 2011-08-15 NOTE — Telephone Encounter (Signed)
Spoke to patient. She is improving. I did give her the recommendations from [facility].

## 2011-08-15 NOTE — Telephone Encounter (Signed)
It's a good sign that the nausea has resolved, typically the diarrhea is the last to resolve.  Try OTC simethicone (the product in Mylanta [employer]) to reduce the gas and discomfort from that.  As long as she stays well hydrated, she doesn't need to eat, but her stools won't normalize until she can resume a more normal (for her) diet. It is ok to take a dose of Immodium if she's having frequent diarrhea, such that she's having trouble making it to the bathroom.

## 2011-12-29 ENCOUNTER — Ambulatory Visit (INDEPENDENT_AMBULATORY_CARE_PROVIDER_SITE_OTHER): Payer: 59 | Admitting: Physician Assistant

## 2011-12-29 ENCOUNTER — Encounter: Payer: Self-pay | Admitting: Physician Assistant

## 2011-12-29 VITALS — BP 128/78 | HR 111 | Temp 98.7°F | Resp 16 | Ht 64.0 in | Wt 284.0 lb

## 2011-12-29 DIAGNOSIS — E282 Polycystic ovarian syndrome: Secondary | ICD-10-CM

## 2011-12-29 DIAGNOSIS — F419 Anxiety disorder, unspecified: Secondary | ICD-10-CM

## 2011-12-29 DIAGNOSIS — L293 Anogenital pruritus, unspecified: Secondary | ICD-10-CM

## 2011-12-29 DIAGNOSIS — Z01419 Encounter for gynecological examination (general) (routine) without abnormal findings: Secondary | ICD-10-CM

## 2011-12-29 DIAGNOSIS — Z0000 Encounter for general adult medical examination without abnormal findings: Secondary | ICD-10-CM

## 2011-12-29 DIAGNOSIS — F411 Generalized anxiety disorder: Secondary | ICD-10-CM

## 2011-12-29 DIAGNOSIS — E669 Obesity, unspecified: Secondary | ICD-10-CM

## 2011-12-29 DIAGNOSIS — N898 Other specified noninflammatory disorders of vagina: Secondary | ICD-10-CM

## 2011-12-29 LAB — LIPID PANEL
Cholesterol: 183 mg/dL (ref 0–200)
HDL: 42 mg/dL (ref >39)
LDL Cholesterol: 104 mg/dL — ABNORMAL HIGH (ref 0–99)
Triglycerides: 187 mg/dL — ABNORMAL HIGH (ref <150)

## 2011-12-29 LAB — POCT URINALYSIS DIPSTICK
Blood, UA: NEGATIVE
Nitrite, UA: NEGATIVE
Protein, UA: NEGATIVE
Spec Grav, UA: >=1.03
Urobilinogen, UA: 0.2
pH, UA: 5

## 2011-12-29 LAB — COMPREHENSIVE METABOLIC PANEL
AST: 20 U/L (ref 0–37)
BUN: 10 mg/dL (ref 6–23)
Calcium: 9.4 mg/dL (ref 8.4–10.5)
Chloride: 105 mEq/L (ref 96–112)
Creat: 0.68 mg/dL (ref 0.50–1.10)
Glucose, Bld: 96 mg/dL (ref 70–99)

## 2011-12-29 LAB — POCT WET PREP WITH KOH
KOH Prep POC: NEGATIVE
RBC Wet Prep HPF POC: NEGATIVE
WBC Wet Prep HPF POC: NEGATIVE
Yeast Wet Prep HPF POC: NEGATIVE

## 2011-12-29 LAB — CBC WITH DIFFERENTIAL/PLATELET
Basophils Absolute: 0 10*3/uL (ref 0.0–0.1)
Eosinophils Relative: 3 % (ref 0–5)
HCT: 42 % (ref 36.0–46.0)
Lymphocytes Relative: 22 % (ref 12–46)
MCHC: 34.3 g/dL (ref 30.0–36.0)
MCV: 85.2 fL (ref 78.0–100.0)
Monocytes Absolute: 0.4 10*3/uL (ref 0.1–1.0)
Monocytes Relative: 5 % (ref 3–12)
RDW: 13.7 % (ref 11.5–15.5)

## 2011-12-29 MED ORDER — TRAZODONE HCL 50 MG PO TABS: ORAL_TABLET | ORAL | Status: DC

## 2011-12-29 MED ORDER — CLONAZEPAM 0.5 MG PO TABS: ORAL_TABLET | ORAL | Status: DC

## 2011-12-29 MED ORDER — BUPROPION HCL ER (SR) 150 MG PO TB12: ORAL_TABLET | ORAL | Status: DC

## 2011-12-29 MED ORDER — METFORMIN HCL 500 MG PO TABS: 500.0000 mg | ORAL_TABLET | Freq: Two times a day (BID) | ORAL | Status: DC

## 2011-12-29 MED ORDER — KETOCONAZOLE 2 % EX CREA: TOPICAL_CREAM | Freq: Every day | CUTANEOUS | Status: DC

## 2011-12-30 ENCOUNTER — Encounter: Payer: Self-pay | Admitting: Physician Assistant

## 2011-12-30 LAB — PAP IG W/ RFLX HPV ASCU

## 2011-12-30 LAB — TSH: TSH: 2.142 u[IU]/mL (ref 0.350–4.500)

## 2011-12-30 NOTE — Progress Notes (Signed)
  Subjective:    Patient ID: Kerri Ford, female    DOB: 07/04/1980, 31 y.o.   MRN: [id number]  HPI 31 yr old CF presents for CPE and f/up on meds. She is currently taking 1 wellbutrin daily.  She has taken <12 clonazepam since I last saw her(april, 2013). She is doing well overall.  Her counselor had a baby and wont be going back into practice for now so she has a new counselor    Review of Systems  All other systems reviewed and are negative.       Objective:   Physical Exam  Nursing note and vitals reviewed. Constitutional: She is oriented to person, place, and time. She appears well-developed and well-nourished.       obese  HENT:  Head: Normocephalic and atraumatic.  Left Ear: External ear normal.  Mouth/Throat: Oropharynx is clear and moist. No oropharyngeal exudate.  Eyes: Conjunctivae normal and EOM are normal. Pupils are equal, round, and reactive to light.  Neck: Normal range of motion. Neck supple. No thyromegaly present.  Cardiovascular: Normal rate, regular rhythm and intact distal pulses.  Exam reveals no gallop and no friction rub.   No murmur heard. Pulmonary/Chest: Effort normal and breath sounds normal.  Abdominal: Soft. Bowel sounds are normal.       Exam limited by obesity  Genitourinary: Vagina normal and uterus normal. No vaginal discharge found.       Only able to visualized superior portion of the cervix.  Blind swab into os. Bimanual exam unremarkable.  Musculoskeletal: Normal range of motion.  Lymphadenopathy:    She has no cervical adenopathy.  Neurological: She is alert and oriented to person, place, and time.  Skin: Skin is warm and dry.  Psychiatric: She has a normal mood and affect. Her behavior is normal. Thought content normal.       Assessment & Plan:  CPE and gyn exam PCOS-checking labs, and needs to work on weight loss, exercise, decrease carbs. Anxiety-she can have prescriptions of clonazepam #30 every 3 months. Continue current  meds.

## 2011-12-31 DIAGNOSIS — F418 Other specified anxiety disorders: Secondary | ICD-10-CM | POA: Insufficient documentation

## 2012-03-09 ENCOUNTER — Ambulatory Visit (INDEPENDENT_AMBULATORY_CARE_PROVIDER_SITE_OTHER): Payer: 59 | Admitting: Physician Assistant

## 2012-03-09 VITALS — BP 116/76 | HR 100 | Temp 98.2°F | Resp 18 | Ht 64.0 in | Wt 279.4 lb

## 2012-03-09 DIAGNOSIS — J329 Chronic sinusitis, unspecified: Secondary | ICD-10-CM

## 2012-03-09 DIAGNOSIS — E8881 Metabolic syndrome: Secondary | ICD-10-CM

## 2012-03-09 DIAGNOSIS — R52 Pain, unspecified: Secondary | ICD-10-CM

## 2012-03-09 LAB — POCT CBC
Hemoglobin: 14 g/dL (ref 12.2–16.2)
MCH, POC: 28.7 pg (ref 27–31.2)
MID (cbc): 0.5 (ref 0–0.9)
MPV: 8.7 fL (ref 0–99.8)
POC Granulocyte: 5.5 (ref 2–6.9)
POC MID %: 5.5 %M (ref 0–12)
Platelet Count, POC: 353 10*3/uL (ref 142–424)
RBC: 4.88 M/uL (ref 4.04–5.48)

## 2012-03-09 MED ORDER — FLUTICASONE PROPIONATE 50 MCG/ACT NA SUSP: 2.0000 | Freq: Every day | NASAL | Status: DC

## 2012-03-09 MED ORDER — AMOXICILLIN 500 MG PO CAPS: ORAL_CAPSULE | ORAL | Status: DC

## 2012-03-09 NOTE — Progress Notes (Signed)
  Subjective:    Patient ID: Kerri Ford, female    DOB: August 02, 1980, 31 y.o.   MRN: [id number]  HPI Kerri Ford presents today not feeling well for about 5 days. She has sinus congestion and swelling, ear congestion, achey all over.  No fever. No cough.   She's having a lot of feelings come up with it being the holidays.  She is coping well and continuing with her newer counselor and has decided she thinks her new counselor is going to work out!!   Not doing well with the amount of sugar she has been eating this week.  She has read the schwarzbein principle.    Review of Systems  All other systems reviewed and are negative.       Objective:   Physical Exam  Nursing note and vitals reviewed. Constitutional: She is oriented to person, place, and time. She appears well-developed and well-nourished.  HENT:  Head: Normocephalic and atraumatic.  Right Ear: External ear normal.  Left Ear: External ear normal.  Mouth/Throat: Oropharynx is clear and moist. No oropharyngeal exudate.       Nose with turbinate swelling and yellowish rhinorrhea.  Neck: Normal range of motion. Neck supple. No thyromegaly present.  Cardiovascular: Normal rate, regular rhythm and normal heart sounds.   Pulmonary/Chest: Effort normal and breath sounds normal.       Recheck pulse ox 99%  Lymphadenopathy:    She has no cervical adenopathy.  Neurological: She is alert and oriented to person, place, and time.  Skin: Skin is warm and dry.  Psychiatric: She has a normal mood and affect. Her behavior is normal. Judgment and thought content normal.     Results for orders placed in visit on 03/09/12  POCT CBC      Component Value Range   WBC 8.2  4.6 - 10.2 K/uL   Lymph, poc 2.2  0.6 - 3.4   POC LYMPH PERCENT 27.3  10 - 50 %L   MID (cbc) 0.5  0 - 0.9   POC MID % 5.5  0 - 12 %M   POC Granulocyte 5.5  2 - 6.9   Granulocyte percent 67.2  37 - 80 %G   RBC 4.88  4.04 - 5.48 M/uL   Hemoglobin 14.0  12.2 - 16.2 g/dL   HCT, POC 30.8  65.7 - 47.9 %   MCV 91.1  80 - 97 fL   MCH, POC 28.7  27 - 31.2 pg   MCHC 31.5 (*) 31.8 - 35.4 g/dL   RDW, POC 84.6     Platelet Count, POC 353  142 - 424 K/uL   MPV 8.7  0 - 99.8 fL        Assessment & Plan:  Sinusitis-see Rxs.  Add mucinex D. Fluids, rest. Spent >45 mins face to face counseling. Insulin resistance.  I printed out her labs and we reviewed the importance of healthy diet and exercise.

## 2012-03-29 ENCOUNTER — Ambulatory Visit (INDEPENDENT_AMBULATORY_CARE_PROVIDER_SITE_OTHER): Payer: 59 | Admitting: Physician Assistant

## 2012-03-29 ENCOUNTER — Encounter: Payer: Self-pay | Admitting: Physician Assistant

## 2012-03-29 VITALS — BP 120/84 | HR 80 | Temp 99.2°F | Resp 16 | Ht 64.5 in | Wt 277.6 lb

## 2012-03-29 DIAGNOSIS — F329 Major depressive disorder, single episode, unspecified: Secondary | ICD-10-CM

## 2012-03-29 DIAGNOSIS — E8881 Metabolic syndrome: Secondary | ICD-10-CM

## 2012-04-01 NOTE — Progress Notes (Signed)
  Subjective:    Patient ID: Kerri Ford, female    DOB: May 23, 1980, 31 y.o.   MRN: [id number]  HPI 31 yr old CF presents for a check up for insulin resistance.  She is finally compliant with her  Metformin bid.  She has gone to an OA meeting this past weekend.  She doesn't feel like she has certain "trigger" foods, but she does admit to "medicating with food."  She is having a difficult time with it being the holidays.  She is still actively seeing her counselor. She denies SI/HI.  Review of Systems  All other systems reviewed and are negative.       Objective:   Physical Exam  Nursing note and vitals reviewed. Constitutional: She is oriented to person, place, and time. She appears well-developed and well-nourished.  HENT:  Head: Normocephalic and atraumatic.  Neck: Normal range of motion.  Cardiovascular: Normal rate, regular rhythm and normal heart sounds.   Pulmonary/Chest: Effort normal and breath sounds normal.  Neurological: She is alert and oriented to person, place, and time.  Skin: Skin is warm and dry.  Psychiatric: She has a normal mood and affect. Her behavior is normal.      Assessment & Plan:  Insulin resistance-I see no need to perform labs today bc they they will be "normal" due to her being on medication. I think it will be more beneficial to do labs after she has made some lifestyle changes and has some weight loss.   Anxiety/depression/food addiction/obesity-She will continue doing OA at least 1X per week for now.  She will continue counseling.  She is to write 5 affirmations daily, research benefits of lemon water and work rigorously on dietary changes and exercise. I spent 50 mins face to face with patient.

## 2012-05-03 ENCOUNTER — Telehealth: Payer: Self-pay | Admitting: *Deleted

## 2012-05-03 ENCOUNTER — Other Ambulatory Visit: Payer: Self-pay | Admitting: *Deleted

## 2012-05-03 MED ORDER — BUPROPION HCL ER (SR) 150 MG PO TB12: ORAL_TABLET | ORAL | Status: DC

## 2012-05-03 MED ORDER — METFORMIN HCL 500 MG PO TABS: 500.0000 mg | ORAL_TABLET | Freq: Two times a day (BID) | ORAL | Status: DC

## 2012-05-03 NOTE — Telephone Encounter (Signed)
[facility]

## 2012-05-03 NOTE — Telephone Encounter (Signed)
[facility] a refill on bupropion sr 150mg  90 day supply

## 2012-05-20 ENCOUNTER — Ambulatory Visit (INDEPENDENT_AMBULATORY_CARE_PROVIDER_SITE_OTHER): Payer: 59 | Admitting: Family Medicine

## 2012-05-20 VITALS — BP 135/81 | HR 94 | Temp 98.0°F | Resp 18 | Ht 64.25 in | Wt 273.2 lb

## 2012-05-20 DIAGNOSIS — E282 Polycystic ovarian syndrome: Secondary | ICD-10-CM

## 2012-05-20 DIAGNOSIS — J329 Chronic sinusitis, unspecified: Secondary | ICD-10-CM

## 2012-05-20 DIAGNOSIS — Z79899 Other long term (current) drug therapy: Secondary | ICD-10-CM

## 2012-05-20 LAB — POCT GLYCOSYLATED HEMOGLOBIN (HGB A1C): Hemoglobin A1C: 5.7

## 2012-05-20 MED ORDER — AMOXICILLIN 500 MG PO CAPS: 1000.0000 mg | ORAL_CAPSULE | Freq: Three times a day (TID) | ORAL | Status: DC

## 2012-05-20 MED ORDER — FLUCONAZOLE 150 MG PO TABS: 150.0000 mg | ORAL_TABLET | Freq: Once | ORAL | Status: DC

## 2012-05-20 NOTE — Progress Notes (Signed)
Subjective:    Patient ID: Kerri Ford, female    DOB: 1980/10/17, 32 y.o.   MRN: [id number]   Chief Complaint  Patient presents with  . Sore Throat  . Chills  . Ear Fullness    HPI Has not been able to get rid of sinus infection in past 3 mos. Pharyngitis with crying hard, better w/ hot drinks, lots of blood in nasal congestion.  Did not take the amox she was rx'ed before for sinusitis as she was hoping to avoid the antiiotic due to severe yeast infections that always accompany abx use.  Had to miss work this past wk for 2d  No longer using flonase as she always gets a headache 30 minutes after using it. Doesn't like nasal sprays - bothers her when things go up her nose  Has had 5 rounds of diverticulitis (and hospitalized prev after divertic rupture) and has had 2 rounds of stomach virus - diarrhea after severe lower abdominal pain for 24 hrs several wks ago, then again on 2days prev w/ epigastric pain and ended up with emesis and diarrhea and resolving but still not feeling great and bowels irreg.   Having a lot of mood swings - depression worse.  She has started with overeaters' anonymous and is working closely with her sponsor but it is very difficult - she is being told things she already knows but it makes her really angry when people point out her flaws and the changes she has to make. However, she does feel like she is doing less emotional eating or eating less huge amounts of junk when upset.  Past Medical History  Diagnosis Date  . Anxiety   . Depression   . Allergy    Current Outpatient Prescriptions on File Prior to Visit  Medication Sig Dispense Refill  . Brewers Yeast (YEAST EXTRACT PO) Take by mouth.      Marland Kitchen buPROPion (WELLBUTRIN SR) 150 MG 12 hr tablet 2 daily  180 tablet  1  . cetirizine (ZYRTEC) 10 MG tablet Take 10 mg by mouth daily.      . clonazePAM (KLONOPIN) 0.5 MG tablet 1/2-1 tablet twice daily prn anxiety  30 tablet  0  . diphenhydrAMINE (BENADRYL) 25 MG  tablet Take 25 mg by mouth every 6 (six) hours as needed.      Marland Kitchen ketoconazole (NIZORAL) 2 % cream Apply topically daily.  15 g  0  . metFORMIN (GLUCOPHAGE) 500 MG tablet Take 1 tablet (500 mg total) by mouth 2 (two) times daily with a meal.  180 tablet  0  . Multiple Vitamins-Minerals (WOMENS MULTI VITAMIN & MINERAL PO) Take by mouth.      . Pyridoxine HCl (VITAMIN B-6) 250 MG tablet Take 250 mg by mouth daily.      . traZODone (DESYREL) 50 MG tablet 1 to 3 tabs hs prn sleep  90 tablet  1   No current facility-administered medications on file prior to visit.   Allergies  Allergen Reactions  . Latex Itching and Cough     Review of Systems  Constitutional: Positive for chills and fatigue. Negative for fever, diaphoresis, activity change, appetite change and unexpected weight change.  HENT: Positive for ear pain, nosebleeds, congestion, sore throat, rhinorrhea, sneezing, postnasal drip and sinus pressure. Negative for trouble swallowing, neck pain, neck stiffness, voice change and ear discharge.   Eyes: Negative for discharge and itching.  Respiratory: Positive for cough. Negative for shortness of breath.   Cardiovascular: Negative for chest  pain.  Gastrointestinal: Positive for nausea, vomiting, abdominal pain and diarrhea. Negative for abdominal distention.  Skin: Negative for rash.  Neurological: Positive for headaches. Negative for dizziness and syncope.  Hematological: Positive for adenopathy.  Psychiatric/Behavioral: Positive for behavioral problems, sleep disturbance and dysphoric mood. Negative for hallucinations and confusion.      BP 135/81  Pulse 94  Temp(Src) 98 F (36.7 C) (Oral)  Resp 18  Ht 5' 4.25" (1.632 m)  Wt 273 lb 3.2 oz (123.923 kg)  BMI 46.53 kg/m2  SpO2 97% Objective:   Physical Exam  Constitutional: She is oriented to person, place, and time. She appears well-developed and well-nourished. She appears ill. No distress.  HENT:  Head: Normocephalic and  atraumatic.  Right Ear: External ear and ear canal normal. Tympanic membrane is retracted. A middle ear effusion is present.  Left Ear: External ear and ear canal normal. Tympanic membrane is retracted. A middle ear effusion is present.  Nose: Mucosal edema and rhinorrhea present. Right sinus exhibits maxillary sinus tenderness. Left sinus exhibits maxillary sinus tenderness.  Mouth/Throat: Uvula is midline and mucous membranes are normal. Posterior oropharyngeal erythema present. No oropharyngeal exudate, posterior oropharyngeal edema or tonsillar abscesses.  Eyes: Conjunctivae are normal. Right eye exhibits no discharge. Left eye exhibits no discharge. No scleral icterus.  Neck: Normal range of motion. Neck supple.  Cardiovascular: Normal rate, regular rhythm, normal heart sounds and intact distal pulses.   Pulmonary/Chest: Effort normal and breath sounds normal.  Lymphadenopathy:       Head (right side): Submandibular adenopathy present. No preauricular and no posterior auricular adenopathy present.       Head (left side): Submandibular adenopathy present. No preauricular and no posterior auricular adenopathy present.    She has no cervical adenopathy.       Right: No supraclavicular adenopathy present.       Left: No supraclavicular adenopathy present.  Neurological: She is alert and oriented to person, place, and time.  Skin: Skin is warm and dry. She is not diaphoretic. No erythema.  Psychiatric: She has a normal mood and affect. Her behavior is normal.       Results for orders placed in visit on 05/20/12  POCT GLYCOSYLATED HEMOGLOBIN (HGB A1C)      Result Value Range   Hemoglobin A1C 5.7      Assessment & Plan:   1. PCOS (polycystic ovarian syndrome)    2. Morbid obesity with BMI of 45.0-49.9, adult  POCT glycosylated hemoglobin (Hb A1C)    Pt was delighted to hear that her hgba1c was NORMAL as developing DM is one of her worst fears - marks true failure for her. I checked it to  see if there was any room to increase her metformin dose or even consider byetta/victoza to aide w/ weightloss if pt had a pre-DM but she does not.  Pt is going to continue in [facility]vereat[employer]'s [employer] for now but may need to consider surgical intervention in the future as long as she was involved in therapy. Currently she is very proud of herself for her current weightloss over the past 2 mos and plans to continue. POCT glycosylated hemoglobin (Hb A1C)  3. Encounter for long-term (current) use of other medications    4. Sinusitis - Cont steam, humidifier, unfortunately, she can't tolerate nasal sprays or netti pot/sinus rinse. Try to stay away from sudafed type products due to BP. amoxicillin (AMOXIL) 500 MG capsule   amoxicillin (AMOXIL) 500 MG capsule   fluconazole (DIFLUCAN) 150  MG tablet   DISCONTINUED: amoxicillin (AMOXIL) 500 MG capsule   DISCONTINUED: fluconazole (DIFLUCAN) 150 MG tablet  Pt is going to f/u w/ her PCP Marylene Land in another month and hopes to have lost additional weight by then which she fully expects to be able to do - esp with adding in some exercise. Planning to do additional labs at that appt.  Always gets severe vag candidiasis w/ abx so ok to rx fluconazole. Meds ordered this encounter  Medications  . amoxicillin (AMOXIL) 500 MG capsule    Sig: Take 2 capsules (1,000 mg total) by mouth 3 (three) times daily.    Dispense:  60 capsule    Refill:  0    Order Specific Question:      Answer:    . fluconazole (DIFLUCAN) 150 MG tablet    Sig: Take 1 tablet (150 mg total) by mouth once. Repeat every 3d while on antibiotic needed    Dispense:  4 tablet    Refill:  0

## 2012-06-02 ENCOUNTER — Ambulatory Visit (INDEPENDENT_AMBULATORY_CARE_PROVIDER_SITE_OTHER): Payer: 59 | Admitting: Physician Assistant

## 2012-06-02 VITALS — BP 116/84 | HR 91 | Temp 98.2°F | Resp 18 | Ht 64.0 in | Wt 274.2 lb

## 2012-06-02 DIAGNOSIS — B356 Tinea cruris: Secondary | ICD-10-CM

## 2012-06-02 DIAGNOSIS — J019 Acute sinusitis, unspecified: Secondary | ICD-10-CM

## 2012-06-02 DIAGNOSIS — F411 Generalized anxiety disorder: Secondary | ICD-10-CM

## 2012-06-02 MED ORDER — MOMETASONE FUROATE 50 MCG/ACT NA SUSP: 2.0000 | Freq: Every day | NASAL | Status: DC

## 2012-06-02 MED ORDER — KETOCONAZOLE 2 % EX CREA: TOPICAL_CREAM | Freq: Every day | CUTANEOUS | Status: DC

## 2012-06-02 NOTE — Progress Notes (Signed)
  Subjective:    Patient ID: Kerri Ford, female    DOB: [birth year], 32 y.o.   MRN: [id number]  HPI See last note.  Patient was here 2 weeks ago and treated for sinusitis.  She continues to have some sinus drainage and congestion with occasional cough.  She sleeps with a heater blowing on her at night.  She isn't using a nasal spray right now. She says that flonase gives her a headache. She uses a netti pot when she remembers.  We had initiated treatment of tinea cruris when I saw her in December, but she didn't use it for 6 weeks as directed. So the area on her inner thigh and labia majora.  She is continuing to go to OA and she has lost 20 pounds since she started going. She now has an OA sponsor and she is working the steps.    Review of Systems  All other systems reviewed and are negative.       Objective:   Physical Exam  Nursing note and vitals reviewed. Constitutional: She is oriented to person, place, and time. She appears well-developed and well-nourished.  HENT:  Head: Normocephalic and atraumatic.  Right Ear: External ear normal.  Left Ear: External ear normal.  B TM full, no infection. Turbinates pale and boggy.  Neck: Normal range of motion. Neck supple.  Cardiovascular: Normal rate, regular rhythm and normal heart sounds.   Pulmonary/Chest: Effort normal and breath sounds normal.  Neurological: She is alert and oriented to person, place, and time.  Skin: Skin is warm and dry.  Psychiatric: She has a normal mood and affect. Her behavior is normal.       Assessment & Plan:  Sinus inflammation-I do not think she has any infection at this point but rather her sinuses are inflamed from the heater blowing on her at night and seasonal changes.  Increase water intake. Cold air humidifier at night would be helpful.  Try nasonex.  Tinea cruris-nizoral Continue OA.  See me next month as planned.

## 2012-06-08 ENCOUNTER — Ambulatory Visit (INDEPENDENT_AMBULATORY_CARE_PROVIDER_SITE_OTHER): Payer: 59 | Admitting: Family Medicine

## 2012-06-08 VITALS — BP 128/83 | HR 91 | Temp 99.0°F | Resp 20 | Ht 64.0 in | Wt 275.0 lb

## 2012-06-08 DIAGNOSIS — R05 Cough: Secondary | ICD-10-CM

## 2012-06-08 DIAGNOSIS — R6883 Chills (without fever): Secondary | ICD-10-CM

## 2012-06-08 DIAGNOSIS — J069 Acute upper respiratory infection, unspecified: Secondary | ICD-10-CM

## 2012-06-08 LAB — POCT CBC
HCT, POC: 42.2 % (ref 37.7–47.9)
Hemoglobin: 13.1 g/dL (ref 12.2–16.2)
Lymph, poc: 2.4 (ref 0.6–3.4)
MCH, POC: 28.1 pg (ref 27–31.2)
MCHC: 31 g/dL — AB (ref 31.8–35.4)
MPV: 9 fL (ref 0–99.8)
POC MID %: 4.6 %M (ref 0–12)
RBC: 4.66 M/uL (ref 4.04–5.48)
WBC: 10.4 10*3/uL — AB (ref 4.6–10.2)

## 2012-06-08 LAB — POCT INFLUENZA A/B: Influenza A, POC: NEGATIVE

## 2012-06-08 LAB — GLUCOSE, POCT (MANUAL RESULT ENTRY): POC Glucose: 94 mg/dl (ref 70–99)

## 2012-06-08 MED ORDER — HYDROCOD POLST-CHLORPHEN POLST 10-8 MG/5ML PO LQCR: 5.0000 mL | Freq: Two times a day (BID) | ORAL | Status: DC | PRN

## 2012-06-08 NOTE — Progress Notes (Signed)
[address]   [place], [place]  [address]   [phone]  Subjective:    Patient ID: Kerri Ford, female    DOB: 1980-12-12, 32 y.o.   MRN: [id number]  HPI This 32 y.o. female presents for evaluation of flu like symptoms.  Onset today.  +chills/sweats.  +feverish.  +HA intermittent.  +diarrhea.  No ear pain; no sore throat.  +rhinorrhea; +nasal congestion.  +coughing.  +sneezing.  No SOB.  +diarrhea x 2 today.  S/p ten day regimen of Amoxicillin.  +body aches new today.  Took Nyquil last night; Sudafed 120mg  earlier today.  Works as Automotive engineer entry.  No tobacco.  No asthma.   No flu shot this season.   Review of Systems  Constitutional: Positive for fever, chills and fatigue. Negative for diaphoresis.  HENT: Positive for congestion, rhinorrhea, sneezing, postnasal drip and sinus pressure. Negative for ear pain, sore throat, mouth sores, trouble swallowing and voice change.   Respiratory: Positive for cough. Negative for shortness of breath, wheezing and stridor.   Gastrointestinal: Positive for diarrhea. Negative for nausea, vomiting and abdominal pain.  Skin: Negative for rash.  Neurological: Positive for headaches. Negative for dizziness and light-headedness.        Past Medical History  Diagnosis Date  . Anxiety   . Depression   . Allergy     Past Surgical History  Procedure Laterality Date  . Cholecystectomy    . Sigmoid colectomy  2012  . Dislocated ankle      Prior to Admission medications   Medication Sig Start Date End Date Taking? Authorizing Provider  buPROPion [facility] SR) 150 MG 12 hr tablet 2 daily 05/03/12  Yes Eleanore E Egan, PA-C  cetirizine (ZYRTEC) 10 MG tablet Take 10 mg by mouth daily.   Yes Historical Provider, MD  clonazePAM (KLONOPIN) 0.5 MG tablet 1/2-1 tablet twice daily prn anxiety 12/29/11  Yes Anders Simmonds, PA-C  diphenhydrAMINE (BENADRYL) 25 MG tablet Take 25 mg by mouth every 6 (six) hours as needed.   Yes Historical Provider, MD    ketoconazole (NIZORAL) 2 % cream Apply topically daily. 06/02/12  Yes Anders Simmonds, PA-C  metFORMIN (GLUCOPHAGE) 500 MG tablet Take 1 tablet (500 mg total) by mouth 2 (two) times daily with a meal. 05/03/12  Yes Eleanore Delia Chimes, PA-C  Multiple Vitamins-Minerals (WOMENS MULTI VITAMIN & MINERAL PO) Take by mouth.   Yes Historical Provider, MD  pseudoephedrine (SUDAFED) 120 MG 12 hr tablet Take 120 mg by mouth every 12 (twelve) hours.   Yes Historical Provider, MD  Pyridoxine HCl (VITAMIN B-6) 250 MG tablet Take 250 mg by mouth daily.   Yes Historical Provider, MD  traZODone (DESYREL) 50 MG tablet 1 to 3 tabs hs prn sleep 12/29/11  Yes Marzella Schlein McClung, PA-C  mometasone (NASONEX) 50 MCG/ACT nasal spray Place 2 sprays into the nose daily. 06/02/12   Anders Simmonds, PA-C    Allergies  Allergen Reactions  . Fluticasone Propionate     Headache with nasal spray  . Latex Itching and Cough    History   Social History  . Marital Status: Single    Spouse Name: N/A    Number of Children: N/A  . Years of Education: N/A   Occupational History  . Not on file.   Social History Main Topics  . Smoking status: Never Smoker   . Smokeless tobacco: Not on file  . Alcohol Use: Yes     Comment: rare  .  Drug Use: No  . Sexually Active: No   Other Topics Concern  . Not on file   Social History Narrative  . No narrative on file    Family History  Problem Relation Age of Onset  . Diabetes Father     Objective:   Physical Exam  Nursing note and vitals reviewed. Constitutional: She is oriented to person, place, and time. She appears well-developed and well-nourished. No distress.  HENT:  Head: Normocephalic and atraumatic.  Right Ear: External ear normal.  Left Ear: External ear normal.  Nose: Mucosal edema and rhinorrhea present. Right sinus exhibits maxillary sinus tenderness. Right sinus exhibits no frontal sinus tenderness. Left sinus exhibits maxillary sinus tenderness. Left sinus  exhibits no frontal sinus tenderness.  Mouth/Throat: Oropharynx is clear and moist.  Eyes: Conjunctivae are normal. Pupils are equal, round, and reactive to light.  Neck: Normal range of motion. Neck supple.  Cardiovascular: Normal rate, regular rhythm and normal heart sounds.   No murmur heard. Pulmonary/Chest: Effort normal and breath sounds normal. She has no wheezes. She has no rales.  Lymphadenopathy:    She has cervical adenopathy.  Neurological: She is alert and oriented to person, place, and time. No cranial nerve deficit.  Skin: Skin is warm and dry. No rash noted. She is not diaphoretic.  Psychiatric: She has a normal mood and affect. Her behavior is normal.   Results for orders placed in visit on 06/08/12  POCT CBC      Result Value Range   WBC 10.4 (*) 4.6 - 10.2 K/uL   Lymph, poc 2.4  0.6 - 3.4   POC LYMPH PERCENT 23.1  10 - 50 %L   MID (cbc) 0.5  0 - 0.9   POC MID % 4.6  0 - 12 %M   POC Granulocyte 7.5 (*) 2 - 6.9   Granulocyte percent 72.3  37 - 80 %G   RBC 4.66  4.04 - 5.48 M/uL   Hemoglobin 13.1  12.2 - 16.2 g/dL   HCT, POC 62.1  30.8 - 47.9 %   MCV 90.6  80 - 97 fL   MCH, POC 28.1  27 - 31.2 pg   MCHC 31.0 (*) 31.8 - 35.4 g/dL   RDW, POC 65.7     Platelet Count, POC 371  142 - 424 K/uL   MPV 9.0  0 - 99.8 fL  POCT INFLUENZA A/B      Result Value Range   Influenza A, POC Negative     Influenza B, POC Negative    GLUCOSE, POCT (MANUAL RESULT ENTRY)      Result Value Range   POC Glucose 94  70 - 99 mg/dl       Assessment & Plan:  Chills - Plan: POCT CBC, POCT Influenza A/B, POCT glucose (manual entry)  Acute upper respiratory infections of unspecified site  Cough - Plan: POCT CBC, POCT Influenza A/B, POCT glucose (manual entry)   1. URI: New.  Versus influenza infection.  Recommend supportive care with rest, fluids, Tylenol or Motrin. Continue Pseudoephedrine, Nyquil.  Rx for Tussionex provided for cough.  Advised no work for next 72 hours.  RTC for  acute worsening or persistent symptoms.    Meds ordered this encounter  Medications  . chlorpheniramine-HYDROcodone (TUSSIONEX PENNKINETIC ER) 10-8 MG/5ML LQCR    Sig: Take 5 mLs by mouth every 12 (twelve) hours as needed.    Dispense:  240 mL    Refill:  0

## 2012-06-08 NOTE — Patient Instructions (Addendum)
Chills - Plan: POCT CBC, POCT Influenza A/B, POCT glucose (manual entry)  Acute upper respiratory infections of unspecified site  Cough - Plan: POCT CBC, POCT Influenza A/B, POCT glucose (manual entry)

## 2012-06-17 ENCOUNTER — Telehealth: Payer: Self-pay

## 2012-06-17 NOTE — Telephone Encounter (Signed)
spoke to patient advised this is not normal she has nasal drainage. Cough better when using cough meds. She is very congested. Feels like she may be getting sinus infection. Please advise. Does she need to return to clnic? Or can we try an antibiotic for this?

## 2012-06-17 NOTE — Telephone Encounter (Signed)
PT WAS DIAGNOSED WITH THE FLU ALMOST 10 DAYS AGO.   SAYS THE COUGH/DRAINAGE STILL CONTINUES AND SHE REALLY IS NOT FEELING BETTER.  WANTS TO KNOW IF THIS IS NORMAL.  SAYS SHE HAS CAME IN SEVERAL TIMES IN THE LAST FEW MONTHS.  [phone]

## 2012-06-17 NOTE — Telephone Encounter (Signed)
I see that she was here for 3 visits in February.  However, Dr. Michaelle Copas instructions were to return for acute worsening or persistent symptoms.

## 2012-06-19 NOTE — Telephone Encounter (Signed)
Called her to advise. She will come in today.  

## 2012-06-28 ENCOUNTER — Ambulatory Visit (INDEPENDENT_AMBULATORY_CARE_PROVIDER_SITE_OTHER): Payer: 59 | Admitting: Physician Assistant

## 2012-06-28 ENCOUNTER — Encounter: Payer: Self-pay | Admitting: Physician Assistant

## 2012-06-28 VITALS — BP 110/72 | HR 78 | Temp 98.3°F | Resp 16 | Ht 64.0 in | Wt 274.6 lb

## 2012-06-28 DIAGNOSIS — F411 Generalized anxiety disorder: Secondary | ICD-10-CM

## 2012-06-28 DIAGNOSIS — L680 Hirsutism: Secondary | ICD-10-CM

## 2012-06-28 DIAGNOSIS — E8881 Metabolic syndrome: Secondary | ICD-10-CM

## 2012-06-28 LAB — CBC WITH DIFFERENTIAL/PLATELET
Basophils Absolute: 0 10*3/uL (ref 0.0–0.1)
HCT: 39.9 % (ref 36.0–46.0)
Lymphocytes Relative: 29 % (ref 12–46)
Lymphs Abs: 2 10*3/uL (ref 0.7–4.0)
Neutro Abs: 4.3 10*3/uL (ref 1.7–7.7)
Platelets: 325 10*3/uL (ref 150–400)
RBC: 4.9 MIL/uL (ref 3.87–5.11)
RDW: 14.1 % (ref 11.5–15.5)
WBC: 6.9 10*3/uL (ref 4.0–10.5)

## 2012-06-28 MED ORDER — METFORMIN HCL 500 MG PO TABS: 500.0000 mg | ORAL_TABLET | Freq: Two times a day (BID) | ORAL | Status: DC

## 2012-06-28 MED ORDER — BUPROPION HCL ER (SR) 150 MG PO TB12: ORAL_TABLET | ORAL | Status: DC

## 2012-06-28 NOTE — Progress Notes (Signed)
  Subjective:    Patient ID: Kerri Ford, female    DOB: [birth year], 32 y.o.   MRN: [id number]  HPI Paraskevi is here today for f/up on PCOS, insulin resistance, and high testosterone.  She stopped taking her metformin 2 weeks ago so we could do fasting labs today.  She has been feeling very depressed.  She recently had the flu and with bad weather hasn't been out much.  She missed her Wellbutrin a few days.  Her OA sponsor fired her, but she has another OA sponsor now that she thinks is better suited for her.  Her new counselor is pregnant and will be sending her to someone else. This has brought up a lot of abandonment issues for Delcia.  She is going to continue counseling, and she is going on a women's retreat with her church next week that she is getting excited about.  She hasn't been exercising and her diet has been hit or miss, but she is more aware of food binges and continue to try and do better. She denies HI/SI.  Review of Systems  All other systems reviewed and are negative.       Objective:   Physical Exam  Nursing note and vitals reviewed. Constitutional: She is oriented to person, place, and time. She appears well-developed and well-nourished.  HENT:  Head: Normocephalic and atraumatic.  Cardiovascular: Normal rate.   Pulmonary/Chest: Effort normal.  Neurological: She is alert and oriented to person, place, and time.  Skin: Skin is warm and dry.  Psychiatric: She has a normal mood and affect. Her behavior is normal.  Tearful at times.       Assessment & Plan:  PCOS/obesity/insulin resistance Resume metformin Continue counseling Continue OA Start exercise Spent 50 mins face to face with patient, encouraging and counseling her on continued lifestyle changes.  Continue wellbutrin

## 2012-06-29 LAB — TESTOSTERONE, FREE, TOTAL, SHBG: Testosterone, Free: 3.9 pg/mL (ref 0.6–6.8)

## 2012-07-02 ENCOUNTER — Ambulatory Visit (INDEPENDENT_AMBULATORY_CARE_PROVIDER_SITE_OTHER): Payer: 59 | Admitting: Family Medicine

## 2012-07-02 VITALS — BP 121/74 | HR 111 | Temp 99.9°F | Resp 18 | Ht 64.0 in | Wt 269.0 lb

## 2012-07-02 DIAGNOSIS — R197 Diarrhea, unspecified: Secondary | ICD-10-CM

## 2012-07-02 MED ORDER — METRONIDAZOLE 250 MG PO TABS: 250.0000 mg | ORAL_TABLET | Freq: Three times a day (TID) | ORAL | Status: DC

## 2012-07-02 NOTE — Progress Notes (Signed)
32 yo woman with 2 days of nausea, vomiting and diarrhea.  Taking clear liquids which just run through her.  Associated myalgias. Some incontinence  She has had one problem after another for 5 months:  Sinus, diarrhea, flu, another sinus infection  She was just seen by [facility] who found her PCOS and has shown some improvement in testosterone.  H/o diverticulitis and sigmoid colectomy  Work:  Data entry for [employer]  Objective:  Alert Skin:  Clear, warm and dry Chest:  Clear Heart: reg, no murmur, pulse 90 Abdomen:  nontender  Assessment:  IBS, acute diarrhea, and chronic gi sx since amoxicillin  Plan:  Flagyl 250 mg tid x 7 days Imodium 2 mg tid prn probiotics

## 2012-07-02 NOTE — Patient Instructions (Signed)
immodium 2 mg every 8 hours as needed Probiotics daily for a week   Diarrhea Infections caused by germs (bacterial) or a virus commonly cause diarrhea. Your caregiver has determined that with time, rest and fluids, the diarrhea should improve. In general, eat normally while drinking more water than usual. Although water may prevent dehydration, it does not contain salt and minerals (electrolytes). Broths, weak tea without caffeine and oral rehydration solutions (ORS) replace fluids and electrolytes. Small amounts of fluids should be taken frequently. Large amounts at one time may not be tolerated. Plain water may be harmful in infants and the elderly. Oral rehydrating solutions (ORS) are available at pharmacies and grocery stores. ORS replace water and important electrolytes in proper proportions. Sports drinks are not as effective as ORS and may be harmful due to sugars worsening diarrhea.  ORS is especially recommended for use in children with diarrhea. As a general guideline for children, replace any new fluid losses from diarrhea and/or vomiting with ORS as follows:  If your child weighs 22 pounds or under (10 kg or less), give 60-120 mL ( -  cup or 2 - 4 ounces) of ORS for each episode of diarrheal stool or vomiting episode.  If your child weighs more than 22 pounds (more than 10 kgs), give 120-240 mL ( - 1 cup or 4 - 8 ounces) of ORS for each diarrheal stool or episode of vomiting.  While correcting for dehydration, children should eat normally. However, foods high in sugar should be avoided because this may worsen diarrhea. Large amounts of carbonated soft drinks, juice, gelatin desserts and other highly sugared drinks should be avoided.  After correction of dehydration, other liquids that are appealing to the child may be added. Children should drink small amounts of fluids frequently and fluids should be increased as tolerated. Children should drink enough fluids to keep urine clear or  pale yellow.  Adults should eat normally while drinking more fluids than usual. Drink small amounts of fluids frequently and increase as tolerated. Drink enough fluids to keep urine clear or pale yellow. Broths, weak decaffeinated tea, lemon lime soft drinks (allowed to go flat) and ORS replace fluids and electrolytes.  Avoid:  Carbonated drinks.  Juice.  Extremely hot or cold fluids.  Caffeine drinks.  Fatty, greasy foods.  Alcohol.  Tobacco.  Too much intake of anything at one time.  Gelatin desserts.  Probiotics are active cultures of beneficial bacteria. They may lessen the amount and number of diarrheal stools in adults. Probiotics can be found in yogurt with active cultures and in supplements.  Wash hands well to avoid spreading bacteria and virus.  Anti-diarrheal medications are not recommended for infants and children.  Only take over-the-counter or prescription medicines for pain, discomfort or fever as directed by your caregiver. Do not give aspirin to children because it may cause Reye's Syndrome.  For adults, ask your caregiver if you should continue all prescribed and over-the-counter medicines.  If your caregiver has given you a follow-up appointment, it is very important to keep that appointment. Not keeping the appointment could result in a chronic or permanent injury, and disability. If there is any problem keeping the appointment, you must call back to this facility for assistance. SEEK IMMEDIATE MEDICAL CARE IF:   You or your child is unable to keep fluids down or other symptoms or problems become worse in spite of treatment.  Vomiting or diarrhea develops and becomes persistent.  There is vomiting of blood or bile (  green material).  There is blood in the stool or the stools are black and tarry.  There is no urine output in 6-8 hours or there is only a small amount of very dark urine.  Abdominal pain develops, increases or localizes.  You have a  fever.  Your baby is older than 3 months with a rectal temperature of 102 F (38.9 C) or higher.  Your baby is 66 months old or younger with a rectal temperature of 100.4 F (38 C) or higher.  You or your child develops excessive weakness, dizziness, fainting or extreme thirst.  You or your child develops a rash, stiff neck, severe headache or become irritable or sleepy and difficult to awaken. MAKE SURE YOU:   Understand these instructions.  Will watch your condition.  Will get help right away if you are not doing well or get worse. Document Released: 03/19/2002 Document Revised: 06/21/2011 Document Reviewed: 02/03/2009 [facility] Patient Information 2013 [place], [place].

## 2012-09-12 ENCOUNTER — Ambulatory Visit: Payer: 59

## 2012-09-12 ENCOUNTER — Ambulatory Visit (INDEPENDENT_AMBULATORY_CARE_PROVIDER_SITE_OTHER): Payer: 59 | Admitting: Family Medicine

## 2012-09-12 VITALS — BP 138/88 | HR 90 | Temp 98.2°F | Resp 20 | Ht 64.0 in | Wt 281.2 lb

## 2012-09-12 DIAGNOSIS — R109 Unspecified abdominal pain: Secondary | ICD-10-CM

## 2012-09-12 DIAGNOSIS — K589 Irritable bowel syndrome without diarrhea: Secondary | ICD-10-CM

## 2012-09-12 DIAGNOSIS — G43909 Migraine, unspecified, not intractable, without status migrainosus: Secondary | ICD-10-CM

## 2012-09-12 LAB — POCT CBC
HCT, POC: 46 % (ref 37.7–47.9)
Lymph, poc: 1.9 (ref 0.6–3.4)
MCH, POC: 29.1 pg (ref 27–31.2)
MCHC: 31.1 g/dL — AB (ref 31.8–35.4)
POC Granulocyte: 6.6 (ref 2–6.9)
POC LYMPH PERCENT: 21.3 %L (ref 10–50)
POC MID %: 4.6 %M (ref 0–12)
RDW, POC: 14.3 %
WBC: 8.9 10*3/uL (ref 4.6–10.2)

## 2012-09-12 LAB — POCT URINALYSIS DIPSTICK
Bilirubin, UA: NEGATIVE
Blood, UA: NEGATIVE
Leukocytes, UA: NEGATIVE
Nitrite, UA: NEGATIVE
Protein, UA: NEGATIVE
Urobilinogen, UA: 0.2
pH, UA: 5.5

## 2012-09-12 LAB — POCT UA - MICROSCOPIC ONLY
Casts, Ur, LPF, POC: NEGATIVE
Mucus, UA: NEGATIVE
Yeast, UA: NEGATIVE

## 2012-09-12 MED ORDER — DICYCLOMINE HCL 20 MG PO TABS: 20.0000 mg | ORAL_TABLET | Freq: Four times a day (QID) | ORAL | Status: DC

## 2012-09-12 MED ORDER — KETOPROFEN 50 MG PO CAPS: 50.0000 mg | ORAL_CAPSULE | Freq: Three times a day (TID) | ORAL | Status: DC | PRN

## 2012-09-12 NOTE — Progress Notes (Signed)
[facility] [address], [place] [address] [phone]  Date:  09/12/2012   Name:  Kerri Ford   DOB:  June 24, 1980   MRN:  [id number]  PCP:  Abbe Amsterdam, MD    Chief Complaint: Constipation and Abdominal Pain   History of Present Illness:  Kerri Ford is a 32 y.o. very pleasant female patient who presents with the following:  Here today with a few cpmcerms.  She has noted increased problems with her IBS as of late.  She has noted bloating and constipation- this has been worse for 3 or 4 weeks.  She will alternate between constipation and diarrhea.  She had a sigmoid colectomy almost 2 years ago due to diverticulitis.   She has noted some abdominal pains which may last an hour or so- usually gets better with a bath or passing gas.  She has noted this since the surgery, it occurs on occasion.  She noted it again last night.  Last night she had about an hour of more severe pain- it is much better now although not completely resolved. This seems different that her diverticulitis pain- she describes it as more crampy, and thinks it might be due to gas.  She has been able to eat normally   She has tried flexeril for a muscle spasm in her back.  She has tried to apply a muscle rub but cannot really reach.    She is trying to exercise more, and is reading her bible. 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    She has been checking her breasts and has noted a sore spot on her left lateral breat/ chest wallfor the last 2 weeks.  She does not have menses due to having an IUD.     She is taking a probiotic for her IBS.  She has used bentyl in the past which has been helpful for her.  She had seen Dr. Loreta Ave in the past but would prefer to establish with someone else.    No urinary symptoms.   She is not SA- not ever.  No chance of pregnancy.   She would like a refill of her ketoprofen  rx that she uses for occasional migraine HA.   She uses metformin for PCOS.   Patient Active Problem List   Diagnosis Date Noted  . Morbid obesity with BMI of 45.0-49.9, adult 05/20/2012  . Anxiety state, unspecified 12/31/2011  . PCOS (polycystic ovarian syndrome) 08/11/2011  . Diverticulitis of sigmoid colon 10/09/2010    Past Medical History  Diagnosis Date  . Anxiety   . Depression   . Allergy     Past Surgical History  Procedure Laterality Date  . Cholecystectomy    . Sigmoid colectomy  2012  . Dislocated ankle      History  Substance Use Topics  . Smoking status: Never Smoker   . Smokeless tobacco: Not on file  . Alcohol Use: Yes     Comment: rare    Family History  Problem Relation Age of Onset  . Diabetes Father     Allergies  Allergen Reactions  . Fluticasone Propionate     Headache with nasal spray  . Latex Itching and Cough    Medication list has been reviewed and updated.  Current Outpatient Prescriptions on File Prior to Visit  Medication Sig Dispense Refill  . buPROPion (WELLBUTRIN SR) 150 MG 12  hr tablet 2 daily  180 tablet  3  . cetirizine (ZYRTEC) 10 MG tablet Take 10 mg by mouth daily.      . clonazePAM (KLONOPIN) 0.5 MG tablet 1/2-1 tablet twice daily prn anxiety  30 tablet  0  . diphenhydrAMINE (BENADRYL) 25 MG tablet Take 25 mg by mouth every 6 (six) hours as needed.      . metFORMIN (GLUCOPHAGE) 500 MG tablet Take 1 tablet (500 mg total) by mouth 2 (two) times daily with a meal.  180 tablet  3  . metroNIDAZOLE (FLAGYL) 250 MG tablet Take 1 tablet (250 mg total) by mouth 3 (three) times daily.  21 tablet  0  . Multiple Vitamins-Minerals (WOMENS MULTI VITAMIN & MINERAL PO) Take by mouth.      . Pyridoxine HCl (VITAMIN B-6) 250 MG tablet Take 250 mg by mouth daily.      . traZODone (DESYREL) 50 MG tablet 1 to 3 tabs hs prn sleep  90 tablet  1  . chlorpheniramine-HYDROcodone (TUSSIONEX PENNKINETIC ER) 10-8 MG/5ML LQCR Take 5 mLs by mouth  every 12 (twelve) hours as needed.  240 mL  0  . fluticasone (FLONASE) 50 MCG/ACT nasal spray Place 2 sprays into the nose daily.      Marland Kitchen ketoconazole (NIZORAL) 2 % cream Apply topically daily.  60 g  1  . mometasone (NASONEX) 50 MCG/ACT nasal spray Place 2 sprays into the nose daily.  17 g  12  . pseudoephedrine (SUDAFED) 120 MG 12 hr tablet Take 120 mg by mouth every 12 (twelve) hours.       No current facility-administered medications on file prior to visit.    Review of Systems:  As per HPI- otherwise negative.   Physical Examination: Filed Vitals:   09/12/12 1235  BP: 138/88  Pulse: 98  Temp: 98.2 F (36.8 C)  Resp: 20   Filed Vitals:   09/12/12 1235  Height: 5\' 4"  (1.626 m)  Weight: 281 lb 3.2 oz (127.551 kg)   Body mass index is 48.24 kg/(m^2). Ideal Body Weight: Weight in (lb) to have BMI = 25: 145.3  GEN: WDWN, NAD, Non-toxic, A & O x 3, obese HEENT: Atraumatic, Normocephalic. Neck supple. No masses, No LAD.  Bilateral TM wnl, oropharynx normal.  PEERL,EOMI.   Ears and Nose: No external deformity. CV: RRR, No M/G/R. No JVD. No thrill. No extra heart sounds. PULM: CTA B, no wheezes, crackles, rhonchi. No retractions. No resp. distress. No accessory muscle use. ABD: S, ND, +BS. No rebound. No HSM.  She has mild tenderness over her abdomen, more so in the epigastric and RLQ areas.   Left breast is normal, no masses or dimpling noted.  The area of discomfort actually is over her left chest wall- I am able to reproduce her pain by pressing on this area.  No redness, lesion, or heat, no abscess.  EXTR: No c/c/e NEURO Normal gait.  PSYCH: Normally interactive. Conversant. Not depressed or anxious appearing.  Calm demeanor.   [facility] reading (PRIMARY) by  Dr. Patsy Lager. Abdominal series:increased stool burden right colon, no air fluid levels.  IUD in place ACUTE ABDOMEN SERIES (ABDOMEN 2 VIEW & CHEST 1 VIEW)  Comparison: 09/29/2010 CT.  Findings: No active cardiopulmonary  disease. No airspace disease or effusion. No free air underneath the hemidiaphragms. Cholecystectomy clips are present in the right upper quadrant. The bowel gas pattern is within normal limits. Bilateral SI joint degenerative disease. IUD present within the uterus. No air-fluid levels.  Moderate right-sided stool burden.  IMPRESSION: Normal bowel gas pattern. No acute abnormality. Cholecystectomy.  Clinically significant discrepancy from primary report, if provided: None  Results for orders placed in visit on 09/12/12  POCT CBC      Result Value Range   WBC 8.9  4.6 - 10.2 K/uL   Lymph, poc 1.9  0.6 - 3.4   POC LYMPH PERCENT 21.3  10 - 50 %L   MID (cbc) 0.4  0 - 0.9   POC MID % 4.6  0 - 12 %M   POC Granulocyte 6.6  2 - 6.9   Granulocyte percent 74.1  37 - 80 %G   RBC 4.92  4.04 - 5.48 M/uL   Hemoglobin 14.3  12.2 - 16.2 g/dL   HCT, POC 16.1  09.6 - 47.9 %   MCV 93.5  80 - 97 fL   MCH, POC 29.1  27 - 31.2 pg   MCHC 31.1 (*) 31.8 - 35.4 g/dL   RDW, POC 04.5     Platelet Count, POC 370  142 - 424 K/uL   MPV 9.1  0 - 99.8 fL  POCT UA - MICROSCOPIC ONLY      Result Value Range   WBC, Ur, HPF, POC 0-3     RBC, urine, microscopic 0-1     Bacteria, U Microscopic trace     Mucus, UA neg     Epithelial cells, urine per micros 3-8     Crystals, Ur, HPF, POC neg     Casts, Ur, LPF, POC neg     Yeast, UA neg    POCT URINALYSIS DIPSTICK      Result Value Range   Color, UA yellow     Clarity, UA clear     Glucose, UA neg     Bilirubin, UA neg     Ketones, UA neg     Spec Grav, UA 1.020     Blood, UA neg     pH, UA 5.5     Protein, UA neg     Urobilinogen, UA 0.2     Nitrite, UA neg     Leukocytes, UA Negative       Assessment and Plan: Abdominal pain - Plan: DG Abd Acute W/Chest, POCT CBC, POCT UA - Microscopic Only, POCT urinalysis dipstick, Comprehensive metabolic panel  IBS (irritable bowel syndrome) - Plan: DG Abd Acute W/Chest, POCT CBC, POCT UA - Microscopic Only,  POCT urinalysis dipstick, Comprehensive metabolic panel, dicyclomine (BENTYL) 20 MG tablet  Migraine - Plan: ketoprofen (ORUDIS) 50 MG capsule  Rupa has a history of diverticulitis s/p partial colectomy, and has also noted intermittent pains/ diarrhea/ constipation and bloating which she has attributed to IBS.  I agree that her symptoms seem consistent with IBS.  We will try bentyl QID.  Suspect her RLQ discomfort is due to constipation and stool visualized in right colon.  However, counseled her that appendicitis/ recurrent diverticulitis is also possible and that a CT would be necessary to find out for sure.  At this time she prefers to defer a CT scan, as she is feeling better and has a normal WBC count and vitals.  She is concerned about cost and radiation with CT.  She agrees to seek care again if not better in the next 24 hours- Sooner if worse.   Refilled her ketoprofen to use as needed for occasional migraine HA.   Signed Abbe Amsterdam, MD

## 2012-09-12 NOTE — Patient Instructions (Addendum)
Let's try bentyl for IBS symptoms.  Take it 4x a day.  I would also try taking a stool softener such as colace to keep your bowels coming regularly.  If your pain is not better we need to do a CT scan- if you get worse please go to the ER or call us right away.

## 2012-09-13 LAB — COMPREHENSIVE METABOLIC PANEL
ALT: 26 U/L (ref 0–35)
AST: 19 U/L (ref 0–37)
Alkaline Phosphatase: 52 U/L (ref 39–117)
BUN: 11 mg/dL (ref 6–23)
Chloride: 103 mEq/L (ref 96–112)
Creat: 0.79 mg/dL (ref 0.50–1.10)
Total Bilirubin: 0.4 mg/dL (ref 0.3–1.2)

## 2012-09-14 ENCOUNTER — Encounter: Payer: Self-pay | Admitting: Family Medicine

## 2012-09-14 LAB — URINE CULTURE: Colony Count: NO GROWTH

## 2012-09-18 ENCOUNTER — Ambulatory Visit (INDEPENDENT_AMBULATORY_CARE_PROVIDER_SITE_OTHER): Payer: 59 | Admitting: Physician Assistant

## 2012-09-18 ENCOUNTER — Encounter (HOSPITAL_COMMUNITY): Payer: Self-pay

## 2012-09-18 ENCOUNTER — Ambulatory Visit (HOSPITAL_COMMUNITY)
Admission: RE | Admit: 2012-09-18 | Discharge: 2012-09-18 | Disposition: A | Discharge status: Home / Self Care | Payer: 59 | Source: Ambulatory Visit | Attending: Physician Assistant | Admitting: Physician Assistant

## 2012-09-18 VITALS — BP 116/80 | HR 72 | Temp 99.4°F | Resp 16 | Ht 64.0 in | Wt 283.2 lb

## 2012-09-18 DIAGNOSIS — Z975 Presence of (intrauterine) contraceptive device: Secondary | ICD-10-CM | POA: Insufficient documentation

## 2012-09-18 DIAGNOSIS — R1032 Left lower quadrant pain: Secondary | ICD-10-CM | POA: Insufficient documentation

## 2012-09-18 DIAGNOSIS — R935 Abnormal findings on diagnostic imaging of other abdominal regions, including retroperitoneum: Secondary | ICD-10-CM | POA: Insufficient documentation

## 2012-09-18 DIAGNOSIS — R109 Unspecified abdominal pain: Secondary | ICD-10-CM

## 2012-09-18 LAB — POCT UA - MICROSCOPIC ONLY
Mucus, UA: NEGATIVE
RBC, urine, microscopic: NEGATIVE
Yeast, UA: NEGATIVE

## 2012-09-18 LAB — POCT URINALYSIS DIPSTICK
Bilirubin, UA: NEGATIVE
Blood, UA: NEGATIVE
Ketones, UA: NEGATIVE
Spec Grav, UA: 1.01
pH, UA: 5.5

## 2012-09-18 LAB — POCT CBC
HCT, POC: 45.3 % (ref 37.7–47.9)
Hemoglobin: 13.9 g/dL (ref 12.2–16.2)
Lymph, poc: 2.3 (ref 0.6–3.4)
MCHC: 30.7 g/dL — AB (ref 31.8–35.4)
POC Granulocyte: 8 — AB (ref 2–6.9)
WBC: 10.8 10*3/uL — AB (ref 4.6–10.2)

## 2012-09-18 NOTE — Progress Notes (Signed)
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 Proceed to [facility] for CT scan now. If the CT scan is positive for diverticulitis, will treat. If negative, will refer her to [facility] for further evaluation and treatment. She is encouraged to treat the constipation with a "clean out" dose of Miralax (10 doses in 64 ounces of Sports Drink), which she has done before, and has at home.

## 2012-09-18 NOTE — Progress Notes (Signed)
Subjective:    Patient ID: Kerri Ford, female    DOB: Sep 25, 1980, 32 y.o.   MRN: [id number]  HPI  Pt with history of diverticulitis presents with abdominal pain, bloating, and constipation x 1 week. She was seen last Tuesday by Dr. Dallas Schimke at [facility]. States she has not improved and is seeking a second opinion about whether or not an ED visit is necessary. Pt has been taking stool softners (docusate) and bentyl without adequate relief. Stool is now soft and loose to non-formed, with at least 1 BM per day. Pt complains of bloating and abdominal pressure which is partially relieved by laying down and temporarily relieved with BM, passing gas, or burping. Abdominal pain is cramping in nature, "moves around", but is mostly in LLQ. Pt has many life stressors at the moment and a history of stress and anxiety, however, she feels her physical concerns are being dismissed as stress with little regard for her history of diverticulitis. She is particularly "fearful" of going to the ED alone. Denies: nausea/vomiting, fever/chills.   Review of Systems Denies: vision changes, dizziness, SOB, chest pain, urinary symptoms, peripheral edema.    Objective:   Physical Exam  General: obese female, mildly tearful, in no acute distress but appears uncomfortable  Resp: clear to auscultation anterior & posterior fields bilaterally, no rales/rhonchi/wheezes Cardiac: RRR, no murmurs/rubs/gallops Abdomen: soft, diffusely tender to palpation especially lower quadrants, normal bowel sounds, no CVA tenderness  Extremities: moves all limbs spontaneously Neuro: alert & oriented, cranial nerves II-XII grossly intact Skin: no rashes, lesions, erythema  Results for orders placed in visit on 09/18/12  POCT CBC      Result Value Range   WBC 10.8 (*) 4.6 - 10.2 K/uL   Lymph, poc 2.3  0.6 - 3.4   POC LYMPH PERCENT 21.3  10 - 50 %L   MID (cbc) 0.5  0 - 0.9   POC MID % 4.2  0 - 12 %M   POC Granulocyte 8.0 (*) 2 - 6.9   Granulocyte percent 74.5  37 - 80 %G   RBC 4.86  4.04 - 5.48 M/uL   Hemoglobin 13.9  12.2 - 16.2 g/dL   HCT, POC 65.7  84.6 - 47.9 %   MCV 93.2  80 - 97 fL   MCH, POC 28.6  27 - 31.2 pg   MCHC 30.7 (*) 31.8 - 35.4 g/dL   RDW, POC 96.2     Platelet Count, POC 380  142 - 424 K/uL   MPV 9.0  0 - 99.8 fL  POCT UA - MICROSCOPIC ONLY      Result Value Range   WBC, Ur, HPF, POC 0-1     RBC, urine, microscopic neg     Bacteria, U Microscopic neg     Mucus, UA neg     Epithelial cells, urine per micros 0-1     Crystals, Ur, HPF, POC neg     Casts, Ur, LPF, POC neg     Yeast, UA neg    POCT URINALYSIS DIPSTICK      Result Value Range   Color, UA yellow     Clarity, UA clear     Glucose, UA neg     Bilirubin, UA neg     Ketones, UA neg     Spec Grav, UA 1.010     Blood, UA neg     pH, UA 5.5     Protein, UA neg     Urobilinogen, UA 0.2  Nitrite, UA neg     Leukocytes, UA Negative         Assessment & Plan:   Abdominal pain - Plan: POCT CBC, POCT UA - Microscopic Only, POCT urinalysis dipstick, CT Abdomen Pelvis Wo Contrast  Suspect abdominal cramping and bloating is a result of severe constipation. However, will order abdominal CT due to patient's history of diverticulitis. Miralax cleanse to assist with relief from constipation. Further plans pending CT results.

## 2012-09-18 NOTE — Patient Instructions (Signed)
Go now to [facility], to the CT department.  They are expecting you. I will contact you when I receive the report.

## 2012-09-19 ENCOUNTER — Ambulatory Visit
Admission: RE | Admit: 2012-09-19 | Discharge: 2012-09-19 | Disposition: A | Discharge status: Home / Self Care | Payer: 59 | Source: Ambulatory Visit | Attending: Physician Assistant | Admitting: Physician Assistant

## 2012-09-19 ENCOUNTER — Other Ambulatory Visit: Payer: Self-pay | Admitting: Radiology

## 2012-09-19 DIAGNOSIS — R102 Pelvic and perineal pain: Secondary | ICD-10-CM

## 2012-09-20 ENCOUNTER — Telehealth: Payer: Self-pay

## 2012-09-20 DIAGNOSIS — N83209 Unspecified ovarian cyst, unspecified side: Secondary | ICD-10-CM

## 2012-09-20 NOTE — Telephone Encounter (Signed)
Pt see Dr Mitzi Hansen at [facility]. Pt would like a referral as soon as possible because she is in pain. I will get referral started.

## 2012-09-20 NOTE — Telephone Encounter (Signed)
Pt states that the Ct scan that she recently had revealed that she has an ovarian cyst, pt would like to know if she should go into work today due to the pain her lower abdomin . Best#[phone]

## 2012-09-20 NOTE — Telephone Encounter (Signed)
If she is still having pain, we should try to get her in to GYN. See result note from [facility]

## 2012-09-20 NOTE — Telephone Encounter (Signed)
Please advise 

## 2012-10-05 ENCOUNTER — Telehealth: Payer: Self-pay

## 2012-10-05 ENCOUNTER — Ambulatory Visit (INDEPENDENT_AMBULATORY_CARE_PROVIDER_SITE_OTHER): Payer: 59 | Admitting: Physician Assistant

## 2012-10-05 VITALS — BP 119/78 | HR 80 | Temp 98.0°F | Resp 16 | Ht 64.0 in | Wt 286.0 lb

## 2012-10-05 DIAGNOSIS — N83209 Unspecified ovarian cyst, unspecified side: Secondary | ICD-10-CM

## 2012-10-05 DIAGNOSIS — E669 Obesity, unspecified: Secondary | ICD-10-CM

## 2012-10-05 DIAGNOSIS — K589 Irritable bowel syndrome without diarrhea: Secondary | ICD-10-CM

## 2012-10-05 DIAGNOSIS — R109 Unspecified abdominal pain: Secondary | ICD-10-CM

## 2012-10-05 DIAGNOSIS — E282 Polycystic ovarian syndrome: Secondary | ICD-10-CM

## 2012-10-05 NOTE — Progress Notes (Signed)
  Subjective:    Patient ID: Kerri Ford, female    DOB: [birth year], 32 y.o.   MRN: [id number]  HPI  Presents for follow up of her chronic abdominal bloating and cramping pain which radiates to left shoulder. Denies nausea/vomiting. States she believed she may be constipated and over the past weekend drank large amounts of prune juice until she had multiple days of diarrhea. She reports feeling very well on Tuesday. Wednesday her abdominal cramping and bloating started again. BMs since have been less painful than previously. Taking bentyl as needed does not appear to help. Describes a time when changing her chair/posture helped with her abdominal cramping. Experienced a warm sensation in her stomach which alarmed her after taking Ketoprofen. States her IBS in the past was diarrhea based and feels different than her current symptoms.   Patient states she knows she is overweight and that some of her health problems may be alleviated with weight loss. She has begun walking for 15 min x 3 each week, switched some coffee for water, and buying healthier groceries.  Will repeat pelvic US on July 28 for f/u of ovarian cyst.    Review of Systems Denies: vision changes, SOB, chest pain, abdominal pain, urinary symptoms     Objective:   Physical Exam  BP 119/78  Pulse 80  Temp(Src) 98 F (36.7 C)  Resp 16  Ht 5\' 4"  (1.626 m)  Wt 286 lb (129.729 kg)  BMI 49.07 kg/m2  General: obese female, appears stated age, NAD Resp: clear to auscultation anterior & posterior fields bilaterally, good air entry, no rales rhonchi wheezes Cardiac: RRR, no murmurs rubs gallops  Abdomen: normal bowel sound, soft, non distended, non tender to palpation, no rebound, no guarding  Neuro: A&O x 3, CN II-XII grossly intact  Skin: no rashes lesions      Assessment & Plan:  Abdominal pain - Plan: Ambulatory referral to Gastroenterology  IBS (irritable bowel syndrome) - Plan: Ambulatory referral to  Gastroenterology  Polycystic ovarian syndrome  Other and unspecified ovarian cyst  Obesity  Suspect IBS.  Plan to follow up for weight management in 1 month.  Refer to GI.

## 2012-10-05 NOTE — Progress Notes (Signed)
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  Counseled regarding triggers/exacerbaters of reflux and IBS.  Stress is her obvious trigger, and work is stressful. She agrees to GI evaluation, though has not had a good experience previously with specialists.   We agree that short-term follow-up will help her stay motivated with healthy lifestyle changes.

## 2012-10-05 NOTE — Patient Instructions (Addendum)
We have referred to you GI. If you do not hear from [name] in 1 week, please call the office.  Continue to make good progress with diet changes and exercise!

## 2012-10-06 ENCOUNTER — Encounter: Payer: Self-pay | Admitting: Gastroenterology

## 2012-10-18 ENCOUNTER — Ambulatory Visit: Payer: 59 | Admitting: Gastroenterology

## 2012-11-01 ENCOUNTER — Ambulatory Visit: Payer: 59 | Admitting: Gastroenterology

## 2012-11-15 ENCOUNTER — Encounter: Payer: Self-pay | Admitting: Family Medicine

## 2012-11-15 ENCOUNTER — Ambulatory Visit (INDEPENDENT_AMBULATORY_CARE_PROVIDER_SITE_OTHER): Payer: 59 | Admitting: Family Medicine

## 2012-11-15 VITALS — BP 124/84 | HR 82 | Temp 98.0°F | Resp 16 | Ht 63.5 in | Wt 281.8 lb

## 2012-11-15 DIAGNOSIS — R197 Diarrhea, unspecified: Secondary | ICD-10-CM

## 2012-11-15 DIAGNOSIS — R5381 Other malaise: Secondary | ICD-10-CM

## 2012-11-15 DIAGNOSIS — R51 Headache: Secondary | ICD-10-CM

## 2012-11-15 DIAGNOSIS — R5383 Other fatigue: Secondary | ICD-10-CM

## 2012-11-15 LAB — POCT RAPID STREP A (OFFICE): Rapid Strep A Screen: NEGATIVE

## 2012-11-15 LAB — POCT CBC
Granulocyte percent: 71.8 %G (ref 37–80)
HCT, POC: 37.2 % — AB (ref 37.7–47.9)
MCHC: 31.2 g/dL — AB (ref 31.8–35.4)
MID (cbc): 0.4 (ref 0–0.9)
MPV: 9 fL (ref 0–99.8)
POC Granulocyte: 6.2 (ref 2–6.9)
POC LYMPH PERCENT: 24 %L (ref 10–50)
POC MID %: 4.2 %M (ref 0–12)
Platelet Count, POC: 295 10*3/uL (ref 142–424)
RDW, POC: 13.7 %

## 2012-11-15 NOTE — Progress Notes (Signed)
[address]   [place], [place]  [address]   [phone]  Subjective:    Patient ID: Kerri Ford, female    DOB: 04-10-1981, 32 y.o.   MRN: [id number]  HPI This 32 y.o. female presents for evaluation of the following:  1. Sister just tested positive for strep today; hung out with sister all weekend.  +HA severe; +nausea; onset one week ago.  Diarrhea yesterday.  Incontinence fecal in past day.  Also itching randomly hands, arms, feet.  No fever but +chills.  No ST; +scratchy throat.  +cough due to scratchy throat.  Bloody nose.  No rhinorrhea; +nasal congestion in morning which is chronic.  +abdominal pain which is chronic issue for patient; history of ovarian cyst which got checked out on Monday by gyn.  Has suffered with five rounds of diverticulitis and surgery; evaluated in 09/2012 by [facility]; sent for CT scan to rule out diverticulitis; CT negative for diverticulitis but + for ovarian cyst.  No bloody stools or melanotic stools.  This episode does not feel like diverticulitis.  Trying Nuvaring.   Onset ten days ago; no improvement so wanted to rule out infectious process.  History of IBS diarrhea more than constipation.   Review of Systems  Constitutional: Positive for chills and fatigue. Negative for fever and diaphoresis.  HENT: Negative for ear pain, congestion, sore throat, rhinorrhea, trouble swallowing, voice change and postnasal drip.   Respiratory: Positive for cough. Negative for shortness of breath, wheezing and stridor.   Gastrointestinal: Positive for nausea, abdominal pain and diarrhea. Negative for vomiting, blood in stool, abdominal distention and anal bleeding.  Skin: Negative for rash.  Neurological: Positive for headaches.    Past Medical History  Diagnosis Date  . Anxiety   . Allergy   . Depression     multiple psych admissions    Past Surgical History  Procedure Laterality Date  . Cholecystectomy    . Sigmoid colectomy  2012  . Dislocated ankle       Prior to Admission medications   Medication Sig Start Date End Date Taking? Authorizing Provider  buPROPion [facility] SR) 150 MG 12 hr tablet 2 daily 06/28/12  Yes Marzella Schlein McClung, PA-C  cetirizine (ZYRTEC) 10 MG tablet Take 10 mg by mouth daily.   Yes Historical Provider, MD  clonazePAM (KLONOPIN) 0.5 MG tablet 1/2-1 tablet twice daily prn anxiety 12/29/11  Yes Marzella Schlein McClung, PA-C  dicyclomine (BENTYL) 20 MG tablet Take 1 tablet (20 mg total) by mouth every 6 (six) hours. 09/12/12  Yes Gwenlyn Found Copland, MD  diphenhydrAMINE (BENADRYL) 25 MG tablet Take 25 mg by mouth every 6 (six) hours as needed.   Yes Historical Provider, MD  ketoprofen (ORUDIS) 50 MG capsule Take 1 capsule (50 mg total) by mouth 3 (three) times daily as needed for pain. As needed for migraine headache. 09/12/12  Yes Gwenlyn Found Copland, MD  metFORMIN (GLUCOPHAGE) 500 MG tablet Take 1 tablet (500 mg total) by mouth 2 (two) times daily with a meal. 06/28/12  Yes Anders Simmonds, PA-C  Multiple Vitamins-Minerals (WOMENS MULTI VITAMIN & MINERAL PO) Take by mouth.   Yes Historical Provider, MD  pseudoephedrine (SUDAFED) 120 MG 12 hr tablet Take 120 mg by mouth 2 (two) times daily as needed.    Yes Historical Provider, MD  Pyridoxine HCl (VITAMIN B-6) 250 MG tablet Take 250 mg by mouth daily.   Yes Historical Provider, MD  traZODone (DESYREL) 50 MG tablet 1 to 3 tabs  hs prn sleep 12/29/11  Yes Anders Simmonds, PA-C    Allergies  Allergen Reactions  . Fluticasone Propionate     Headache with nasal spray  . Latex Itching and Cough    History   Social History  . Marital Status: Single    Spouse Name: N/A    Number of Children: N/A  . Years of Education: N/A   Occupational History  . Not on file.   Social History Main Topics  . Smoking status: Never Smoker   . Smokeless tobacco: Not on file  . Alcohol Use: Yes     Comment: rare  . Drug Use: No  . Sexually Active: No   Other Topics Concern  . Not on file    Social History Narrative  . No narrative on file    Family History  Problem Relation Age of Onset  . Diabetes Father        Objective:   Physical Exam  Nursing note and vitals reviewed. Constitutional: She is oriented to person, place, and time. She appears well-developed and well-nourished. No distress.  HENT:  Head: Normocephalic and atraumatic.  Right Ear: External ear normal.  Left Ear: External ear normal.  Nose: Nose normal.  Mouth/Throat: Mucous membranes are normal. Posterior oropharyngeal erythema present. No oropharyngeal exudate, posterior oropharyngeal edema or tonsillar abscesses.  Eyes: Conjunctivae and EOM are normal. Pupils are equal, round, and reactive to light.  Neck: Normal range of motion. Neck supple. No thyromegaly present.  Cardiovascular: Normal rate, regular rhythm and normal heart sounds.   Pulmonary/Chest: Effort normal and breath sounds normal. She has no wheezes. She has no rales.  Abdominal: Soft. Bowel sounds are normal. She exhibits no distension. There is no tenderness. There is no rebound and no guarding.  Lymphadenopathy:    She has no cervical adenopathy.  Neurological: She is alert and oriented to person, place, and time.  Skin: Skin is warm and dry. No rash noted. She is not diaphoretic.  Psychiatric: She has a normal mood and affect. Her behavior is normal.   Results for orders placed in visit on 11/15/12  POCT CBC      Result Value Range   WBC 8.7  4.6 - 10.2 K/uL   Lymph, poc 2.1  0.6 - 3.4   POC LYMPH PERCENT 24.0  10 - 50 %L   MID (cbc) 0.4  0 - 0.9   POC MID % 4.2  0 - 12 %M   POC Granulocyte 6.2  2 - 6.9   Granulocyte percent 71.8  37 - 80 %G   RBC 4.00 (*) 4.04 - 5.48 M/uL   Hemoglobin 11.6 (*) 12.2 - 16.2 g/dL   HCT, POC 16.1 (*) 09.6 - 47.9 %   MCV 92.9  80 - 97 fL   MCH, POC 29.0  27 - 31.2 pg   MCHC 31.2 (*) 31.8 - 35.4 g/dL   RDW, POC 04.5     Platelet Count, POC 295  142 - 424 K/uL   MPV 9.0  0 - 99.8 fL  POCT  RAPID STREP A (OFFICE)      Result Value Range   Rapid Strep A Screen Negative  Negative       Assessment & Plan:  Headache(784.0) - Plan: POCT rapid strep A  Diarrhea - Plan: POCT CBC, Comprehensive metabolic panel  Other malaise and fatigue - Plan: POCT rapid strep A  1. Headache:  New.  Associated with fatigue/malaise, diarrhea.  Consistent with  viral syndrome.  Strep throat exposure; rapid strep negative.  Supportive care with rest, fluids, Ibuprofen or Tylenol. RTC for acute worsening. 2.  Diarrhea:  New.  Associated with headache, malaise.  BRAT diet, hydrate. RTC for acute worsening or persistent symptoms. Benign abdominal exam. 3.  Malaise/ Fatigue:  New. Associated with above symptoms; consistent with viral syndrome.  Supportive care with rest, fluids.  No orders of the defined types were placed in this encounter.

## 2012-11-15 NOTE — Patient Instructions (Addendum)
1.  Very bland diet for the next week.   2.  Return for worsening symptoms.  Viral Syndrome You or your child has Viral Syndrome. It is the most common infection causing "colds" and infections in the nose, throat, sinuses, and breathing tubes. Sometimes the infection causes nausea, vomiting, or diarrhea. The germ that causes the infection is a virus. No antibiotic or other medicine will kill it. There are medicines that you can take to make you or your child more comfortable.  HOME CARE INSTRUCTIONS   Rest in bed until you start to feel better.  If you have diarrhea or vomiting, eat small amounts of crackers and toast. Soup is helpful.  Do not give aspirin or medicine that contains aspirin to children.  Only take over-the-counter or prescription medicines for pain, discomfort, or fever as directed by your caregiver. SEEK IMMEDIATE MEDICAL CARE IF:   You or your child has not improved within one week.  You or your child has pain that is not at least partially relieved by over-the-counter medicine.  Thick, colored mucus or blood is coughed up.  Discharge from the nose becomes thick yellow or green.  Diarrhea or vomiting gets worse.  There is any major change in your or your child's condition.  You or your child develops a skin rash, stiff neck, severe headache, or are unable to hold down food or fluid.  You or your child has an oral temperature above 102 F (38.9 C), not controlled by medicine.  Your baby is older than 3 months with a rectal temperature of 102 F (38.9 C) or higher.  Your baby is 37 months old or younger with a rectal temperature of 100.4 F (38 C) or higher. Document Released: 03/14/2006 Document Revised: 06/21/2011 Document Reviewed: 03/15/2007 [facility] Patient Information 2014 [place], [place].

## 2012-11-16 LAB — COMPREHENSIVE METABOLIC PANEL
ALT: 22 U/L (ref 0–35)
AST: 17 U/L (ref 0–37)
Alkaline Phosphatase: 42 U/L (ref 39–117)
Creat: 0.91 mg/dL (ref 0.50–1.10)
Sodium: 138 mEq/L (ref 135–145)
Total Bilirubin: 0.2 mg/dL — ABNORMAL LOW (ref 0.3–1.2)
Total Protein: 7.2 g/dL (ref 6.0–8.3)

## 2012-11-18 LAB — CULTURE, GROUP A STREP

## 2013-01-24 ENCOUNTER — Ambulatory Visit (INDEPENDENT_AMBULATORY_CARE_PROVIDER_SITE_OTHER): Payer: 59 | Admitting: Physician Assistant

## 2013-01-24 VITALS — BP 115/74 | HR 93 | Temp 98.7°F | Resp 18 | Ht 64.5 in | Wt 279.6 lb

## 2013-01-24 DIAGNOSIS — M67919 Unspecified disorder of synovium and tendon, unspecified shoulder: Secondary | ICD-10-CM

## 2013-01-24 DIAGNOSIS — J309 Allergic rhinitis, unspecified: Secondary | ICD-10-CM

## 2013-01-24 DIAGNOSIS — M25521 Pain in right elbow: Secondary | ICD-10-CM

## 2013-01-24 DIAGNOSIS — M7581 Other shoulder lesions, right shoulder: Secondary | ICD-10-CM

## 2013-01-24 DIAGNOSIS — M25529 Pain in unspecified elbow: Secondary | ICD-10-CM

## 2013-01-24 NOTE — Patient Instructions (Signed)
Rotator Cuff Tendonitis  The rotator cuff is the collection of all the muscles and tendons (the supraspinatus, infraspinatus, subscapularis, and teres minor muscles and their tendons) that help your shoulder stay in place. This unit holds the head of the upper arm bone (humerus) in the cup (fossa) of the shoulder blade (scapula). Basically, it connects the arm to the shoulder. Tendinitis is a swelling and irritation of the tissue, called cord like structures (tendons) that connect muscle to bone. It usually is caused by overusing the joint involved. When the tissue surrounding a tendon (the synovium) becomes inflamed, it is called tenosynovitis. This also is often the result of overuse in people whose jobs require repetitive (over and over again) types of motion. HOME CARE INSTRUCTIONS   Use a sling or splint for as long as directed by your caregiver until the pain decreases.  Apply ice to the injury for 15-20 minutes, 3-4 times per day. Put the ice in a plastic bag and place a towel between the bag of ice and your skin.  Try to avoid use other than gentle range of motion while your shoulder is painful. Use and exercise only as directed by your caregiver. Stop exercises or range of motion if pain or discomfort increases, unless directed otherwise by your caregiver.  Only take over-the-counter or prescription medicines for pain, discomfort, or fever as directed by your caregiver.  If you were give a shoulder sling and straps (immobilizer), do not remove it except as directed, or until you see a caregiver for a follow-up examination. If you need to remove it, move your arm as little as possible or as directed.  You may want to sleep on several pillows at night to lessen swelling and pain. SEEK IMMEDIATE MEDICAL CARE IF:   Pain in your shoulder increases or new pain develops in your arm, hand, or fingers and is not relieved with medications.  You develop new, unexplained symptoms, especially  increased numbness in the hands or loss of strength, or you develop any worsening of the problems which brought you in for care.  Your arm, hand, or fingers are numb or tingling.  Your arm, hand, or fingers are swollen, painful, or turn white or blue. Document Released: 06/19/2003 Document Revised: 06/21/2011 Document Reviewed: 01/25/2008 [employer] Patient Information 2014 [employer].  

## 2013-01-24 NOTE — Progress Notes (Signed)
  Subjective:    Patient ID: Kerri Ford, female    DOB: 12-Oct-1980, 32 y.o.   MRN: [id number]  HPI 32 year old female presents for evaluation of right shoulder pain.  States symptoms started on 10/13 after having done a lot of lifting over the weekend. Admits this did seem to improve the next day but she then got a flu shot in her right arm.  Reports that night her arm was fine but the next morning she had a lot of pain with ROM of her shoulder and arm.  Does not seem to be particularly tender to touch but hurts quite a bit to move it around.  No hx of injury or trauma. 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 She has taken Aleve which did seem to help her pain some.  Patient is otherwise doing well with no other concerns today.     Review of Systems  Musculoskeletal: Positive for arthralgias (right shoulder). Negative for joint swelling, neck pain and neck stiffness.  Skin: Negative for wound.  Neurological: Negative for dizziness and headaches.       Objective:   Physical Exam  Constitutional: She is oriented to person, place, and time. She appears well-developed and well-nourished.  HENT:  Head: Normocephalic and atraumatic.  Right Ear: External ear normal.  Left Ear: External ear normal.  Eyes: Conjunctivae are normal.  Neck: Normal range of motion.  Cardiovascular: Normal rate.   Pulmonary/Chest: Effort normal.  Musculoskeletal:       Right shoulder: She exhibits pain (with flexion/extension, internal external rotation). She exhibits normal range of motion (has pain with abduction), no bony tenderness, no swelling, no deformity and normal strength.  Neurological: She is alert and oriented to person, place, and time.  Psychiatric: She has a normal mood and affect. Her behavior is normal. Judgment and thought content normal.          Assessment & Plan:  Rotator cuff tendinitis, right  Pain in joint, upper arm,  right  Allergic rhinitis  Patient has rx at home for Flexeril 10 mg that she will take at bedtime Recommend Aleve as directed to help with pain.  Also has leftover Vicodin that she will use only as needed for breakthrough pain If no improvement in 1-2 weeks consider MRI vs Ortho eval

## 2013-02-05 ENCOUNTER — Telehealth: Payer: Self-pay

## 2013-02-05 NOTE — Telephone Encounter (Signed)
Patient calling with an update on her shoulder injury.   She was doing fine until yesterday when she caught a child coming down the slide.   [phone]

## 2013-02-05 NOTE — Telephone Encounter (Signed)
Called her to advise. She indicates it has improved 95% re- irritated it this weekend. She will call back if she needs anything.

## 2013-02-05 NOTE — Telephone Encounter (Signed)
Left message for her to call me back. 

## 2013-02-05 NOTE — Telephone Encounter (Signed)
Patient returned call for Amy.

## 2013-02-05 NOTE — Telephone Encounter (Signed)
If no improvement in 1-2 weeks consider MRI vs Ortho eval Please advise, your note indicates above

## 2013-02-05 NOTE — Telephone Encounter (Signed)
I think that if her symptoms had improved but have flared due to acute injury, she can continue conservative treatment as we discussed. If she would like an ortho evaluation that is ok too.

## 2013-04-24 ENCOUNTER — Telehealth: Payer: Self-pay

## 2013-04-24 ENCOUNTER — Ambulatory Visit (INDEPENDENT_AMBULATORY_CARE_PROVIDER_SITE_OTHER): Payer: 59 | Admitting: Physician Assistant

## 2013-04-24 ENCOUNTER — Encounter: Payer: Self-pay | Admitting: Physician Assistant

## 2013-04-24 VITALS — BP 132/80 | HR 98 | Temp 98.2°F | Resp 18 | Ht 64.0 in | Wt 280.0 lb

## 2013-04-24 DIAGNOSIS — F419 Anxiety disorder, unspecified: Secondary | ICD-10-CM

## 2013-04-24 DIAGNOSIS — R350 Frequency of micturition: Secondary | ICD-10-CM

## 2013-04-24 DIAGNOSIS — IMO0001 Reserved for inherently not codable concepts without codable children: Secondary | ICD-10-CM

## 2013-04-24 DIAGNOSIS — J3489 Other specified disorders of nose and nasal sinuses: Secondary | ICD-10-CM

## 2013-04-24 DIAGNOSIS — Z309 Encounter for contraceptive management, unspecified: Secondary | ICD-10-CM

## 2013-04-24 DIAGNOSIS — F411 Generalized anxiety disorder: Secondary | ICD-10-CM

## 2013-04-24 DIAGNOSIS — R0981 Nasal congestion: Secondary | ICD-10-CM

## 2013-04-24 DIAGNOSIS — E282 Polycystic ovarian syndrome: Secondary | ICD-10-CM

## 2013-04-24 LAB — TSH: TSH: 2.722 u[IU]/mL (ref 0.350–4.500)

## 2013-04-24 LAB — COMPREHENSIVE METABOLIC PANEL
ALBUMIN: 4.2 g/dL (ref 3.5–5.2)
ALT: 29 U/L (ref 0–35)
AST: 19 U/L (ref 0–37)
Alkaline Phosphatase: 36 U/L — ABNORMAL LOW (ref 39–117)
BUN: 11 mg/dL (ref 6–23)
CALCIUM: 9.3 mg/dL (ref 8.4–10.5)
CHLORIDE: 106 meq/L (ref 96–112)
CO2: 26 meq/L (ref 19–32)
Creat: 0.77 mg/dL (ref 0.50–1.10)
GLUCOSE: 92 mg/dL (ref 70–99)
Potassium: 4.4 mEq/L (ref 3.5–5.3)
SODIUM: 140 meq/L (ref 135–145)
TOTAL PROTEIN: 6.9 g/dL (ref 6.0–8.3)
Total Bilirubin: 0.3 mg/dL (ref 0.3–1.2)

## 2013-04-24 LAB — POCT URINALYSIS DIPSTICK
BILIRUBIN UA: NEGATIVE
Blood, UA: NEGATIVE
Glucose, UA: NEGATIVE
KETONES UA: NEGATIVE
Nitrite, UA: NEGATIVE
PROTEIN UA: NEGATIVE
Urobilinogen, UA: 0.2
pH, UA: 5.5

## 2013-04-24 LAB — POCT UA - MICROSCOPIC ONLY
CRYSTALS, UR, HPF, POC: NEGATIVE
Casts, Ur, LPF, POC: NEGATIVE
Mucus, UA: NEGATIVE
Yeast, UA: NEGATIVE

## 2013-04-24 LAB — LIPID PANEL
CHOLESTEROL: 168 mg/dL (ref 0–200)
HDL: 52 mg/dL (ref >39)
LDL Cholesterol: 86 mg/dL (ref 0–99)
TRIGLYCERIDES: 152 mg/dL — AB (ref <150)
Total CHOL/HDL Ratio: 3.2 Ratio
VLDL: 30 mg/dL (ref 0–40)

## 2013-04-24 LAB — POCT GLYCOSYLATED HEMOGLOBIN (HGB A1C): Hemoglobin A1C: 6

## 2013-04-24 LAB — GLUCOSE, POCT (MANUAL RESULT ENTRY): POC Glucose: 110 mg/dl — AB (ref 70–99)

## 2013-04-24 MED ORDER — BUPROPION HCL ER (SR) 150 MG PO TB12: ORAL_TABLET | ORAL | Status: DC

## 2013-04-24 MED ORDER — METFORMIN HCL 500 MG PO TABS: 500.0000 mg | ORAL_TABLET | Freq: Two times a day (BID) | ORAL | Status: DC

## 2013-04-24 MED ORDER — ETONOGESTREL-ETHINYL ESTRADIOL 0.12-0.015 MG/24HR VA RING: VAGINAL_RING | VAGINAL | Status: DC

## 2013-04-24 NOTE — Telephone Encounter (Signed)
Patient is having issues with her rx's [facility] [phone]

## 2013-04-24 NOTE — Progress Notes (Signed)
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
## 2013-04-24 NOTE — Patient Instructions (Signed)
Walk 150 minutes/week. You will receive a call from the Nutritionist to schedule an appointment. Use the Flonase EVERY DAY.

## 2013-04-25 LAB — URINE CULTURE
Colony Count: NO GROWTH
Organism ID, Bacteria: NO GROWTH

## 2013-04-26 ENCOUNTER — Encounter: Payer: Self-pay | Admitting: *Deleted

## 2013-04-26 ENCOUNTER — Encounter: Payer: 59 | Attending: Physician Assistant | Admitting: *Deleted

## 2013-04-26 VITALS — Ht 64.0 in | Wt 279.2 lb

## 2013-04-26 DIAGNOSIS — Z6841 Body Mass Index (BMI) 40.0 and over, adult: Secondary | ICD-10-CM | POA: Insufficient documentation

## 2013-04-26 DIAGNOSIS — E282 Polycystic ovarian syndrome: Secondary | ICD-10-CM | POA: Insufficient documentation

## 2013-04-26 DIAGNOSIS — Z713 Dietary counseling and surveillance: Secondary | ICD-10-CM | POA: Insufficient documentation

## 2013-04-26 DIAGNOSIS — IMO0002 Reserved for concepts with insufficient information to code with codable children: Secondary | ICD-10-CM | POA: Insufficient documentation

## 2013-04-26 DIAGNOSIS — E669 Obesity, unspecified: Secondary | ICD-10-CM | POA: Insufficient documentation

## 2013-04-26 MED ORDER — ETONOGESTREL-ETHINYL ESTRADIOL 0.12-0.015 MG/24HR VA RING: VAGINAL_RING | VAGINAL | Status: DC

## 2013-04-26 MED ORDER — BUPROPION HCL ER (SR) 150 MG PO TB12: ORAL_TABLET | ORAL | Status: DC

## 2013-04-26 MED ORDER — METFORMIN HCL 500 MG PO TABS: 500.0000 mg | ORAL_TABLET | Freq: Two times a day (BID) | ORAL | Status: DC
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
## 2013-04-26 NOTE — Telephone Encounter (Signed)
Pt cancelled her prescriptions with [facility] due to a mix up with how her Nuva Ring was ordered. I have resent the medications as written at her last OV.

## 2013-04-27 ENCOUNTER — Encounter: Payer: Self-pay | Admitting: Physician Assistant

## 2013-06-28 ENCOUNTER — Ambulatory Visit (INDEPENDENT_AMBULATORY_CARE_PROVIDER_SITE_OTHER): Payer: 59 | Admitting: Family Medicine

## 2013-06-28 VITALS — BP 128/85 | HR 94 | Temp 98.5°F | Resp 18 | Wt 271.0 lb

## 2013-06-28 DIAGNOSIS — L290 Pruritus ani: Secondary | ICD-10-CM

## 2013-06-28 DIAGNOSIS — K6289 Other specified diseases of anus and rectum: Secondary | ICD-10-CM

## 2013-06-28 DIAGNOSIS — R0981 Nasal congestion: Secondary | ICD-10-CM

## 2013-06-28 DIAGNOSIS — J3489 Other specified disorders of nose and nasal sinuses: Secondary | ICD-10-CM

## 2013-06-28 MED ORDER — HYDROCORTISONE 2.5 % RE CREA: 1.0000 "application " | TOPICAL_CREAM | Freq: Three times a day (TID) | RECTAL | Status: DC | PRN

## 2013-06-28 MED ORDER — DILTIAZEM GEL 2 %: 1.0000 "application " | Freq: Two times a day (BID) | CUTANEOUS | Status: DC

## 2013-06-28 MED ORDER — LIDOCAINE 5 % EX OINT: 1.0000 "application " | TOPICAL_OINTMENT | CUTANEOUS | Status: DC | PRN

## 2013-06-28 MED ORDER — FLUTICASONE PROPIONATE 50 MCG/ACT NA SUSP: 2.0000 | Freq: Every day | NASAL | Status: DC

## 2013-06-28 NOTE — Patient Instructions (Signed)
Hemorrhoids Hemorrhoids are swollen veins around the rectum or anus. There are two types of hemorrhoids:   Internal hemorrhoids. These occur in the veins just inside the rectum. They may poke through to the outside and become irritated and painful.  External hemorrhoids. These occur in the veins outside the anus and can be felt as a painful swelling or hard lump near the anus. CAUSES  Pregnancy.   Obesity.   Constipation or diarrhea.   Straining to have a bowel movement.   Sitting for long periods on the toilet.  Heavy lifting or other activity that caused you to strain.  Anal intercourse. SYMPTOMS   Pain.   Anal itching or irritation.   Rectal bleeding.   Fecal leakage.   Anal swelling.   One or more lumps around the anus.  DIAGNOSIS  Your caregiver may be able to diagnose hemorrhoids by visual examination. Other examinations or tests that may be performed include:   Examination of the rectal area with a gloved hand (digital rectal exam).   Examination of anal canal using a small tube (scope).   A blood test if you have lost a significant amount of blood.  A test to look inside the colon (sigmoidoscopy or colonoscopy). TREATMENT Most hemorrhoids can be treated at home. However, if symptoms do not seem to be getting better or if you have a lot of rectal bleeding, your caregiver may perform a procedure to help make the hemorrhoids get smaller or remove them completely. Possible treatments include:   Placing a rubber band at the base of the hemorrhoid to cut off the circulation (rubber band ligation).   Injecting a chemical to shrink the hemorrhoid (sclerotherapy).   Using a tool to burn the hemorrhoid (infrared light therapy).   Surgically removing the hemorrhoid (hemorrhoidectomy).   Stapling the hemorrhoid to block blood flow to the tissue (hemorrhoid stapling).  HOME CARE INSTRUCTIONS   Eat foods with fiber, such as whole grains, beans,  nuts, fruits, and vegetables. Ask your doctor about taking products with added fiber in them (fibersupplements).  Increase fluid intake. Drink enough water and fluids to keep your urine clear or pale yellow.   Exercise regularly.   Go to the bathroom when you have the urge to have a bowel movement. Do not wait.   Avoid straining to have bowel movements.   Keep the anal area dry and clean. Use wet toilet paper or moist towelettes after a bowel movement.   Medicated creams and suppositories may be used or applied as directed.   Only take over-the-counter or prescription medicines as directed by your caregiver.   Take warm sitz baths for 15 20 minutes, 3 4 times a day to ease pain and discomfort.   Place ice packs on the hemorrhoids if they are tender and swollen. Using ice packs between sitz baths may be helpful.   Put ice in a plastic bag.   Place a towel between your skin and the bag.   Leave the ice on for 15 20 minutes, 3 4 times a day.   Do not use a donut-shaped pillow or sit on the toilet for long periods. This increases blood pooling and pain.  SEEK MEDICAL CARE IF:  You have increasing pain and swelling that is not controlled by treatment or medicine.  You have uncontrolled bleeding.  You have difficulty or you are unable to have a bowel movement.  You have pain or inflammation outside the area of the hemorrhoids. MAKE SURE YOU:    Understand these instructions.  Will watch your condition.  Will get help right away if you are not doing well or get worse. Document Released: 03/26/2000 Document Revised: 03/15/2012 Document Reviewed: 02/01/2012 [employer] Patient Information 2014 [employer].  
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
## 2013-07-03 ENCOUNTER — Ambulatory Visit (INDEPENDENT_AMBULATORY_CARE_PROVIDER_SITE_OTHER): Payer: 59 | Admitting: Physician Assistant

## 2013-07-03 ENCOUNTER — Encounter: Payer: Self-pay | Admitting: Physician Assistant

## 2013-07-03 VITALS — BP 116/90 | HR 101 | Temp 98.9°F | Resp 16 | Ht 64.0 in | Wt 267.6 lb

## 2013-07-03 DIAGNOSIS — R0981 Nasal congestion: Secondary | ICD-10-CM

## 2013-07-03 DIAGNOSIS — E8881 Metabolic syndrome: Secondary | ICD-10-CM

## 2013-07-03 DIAGNOSIS — E282 Polycystic ovarian syndrome: Secondary | ICD-10-CM

## 2013-07-03 DIAGNOSIS — E669 Obesity, unspecified: Secondary | ICD-10-CM

## 2013-07-03 DIAGNOSIS — F419 Anxiety disorder, unspecified: Secondary | ICD-10-CM

## 2013-07-03 NOTE — Progress Notes (Signed)
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  BP 116/90  Pulse 101  Temp(Src) 98.9 F (37.2 C) (Oral)  Resp 16  Ht 5\' 4"  (1.626 m)  Wt 267 lb 9.6 oz (121.383 kg)  BMI 45.91 kg/m2  SpO2 96%  LMP 06/26/2013  She notes that the spotting on Nuvaring is not so bothersome that she wants to stop it, and is advised that it is likely to diminish with time.  However, she may also want to allow a 3-day ring-free period at some point if she finds the spotting is getting more bothersome.  At her follow-up visit next month, we plan to repeat glucose, A1C and lipids.

## 2013-07-03 NOTE — Patient Instructions (Signed)
Keep up the fantastic work! 

## 2013-07-03 NOTE — Progress Notes (Signed)
Subjective:    Patient ID: Kerri Ford, female    DOB: 03/10/1981, 33 y.o.   MRN: [id number]  HPI Reviewed patients allergies, medication, past medical history, social history and family history  Abdominal pain remains resolved post IUD removal 01/01/14.  The Nuvaring is causing light spotting throughout her cycle but she has no real period and it is changed monthly on the 1st.  She reports a weight loss of about 13# since January 2015.  She reports exercising regularly and watching her diet.  She is walking about 150 minutes per week.  She is also seeing a RD.  She has been snacking on veggies at work and [employer] smarter food choices.  She was seen 06/28/13 by Dr. Conley RollsLe for sinus congestion and drainage.  She was told to use the flonase daily.  She has picked up the prescription but has not started using it yet.  She reports this problem is chronic and she will start the flonase.  She reports having a rough weekend because of lack of sleep, physically not feeling good, and emotionally.  She is in counseling at [facility].  She is also talking with her pastor's wife for support.  She feels she is doing OK right now.  She denies SI/HI.  She reports she is using the medication for anal itching and pain prescribed by Dr. Conley RollsLe on 06/28/13 and it is improving.   Review of Systems As above.     Objective:   Physical Exam  Constitutional: She is oriented to person, place, and time. She appears well-developed and well-nourished. No distress.  HENT:  Head: Normocephalic and atraumatic.  Right Ear: Tympanic membrane and ear canal normal.  Left Ear: Tympanic membrane and ear canal normal.  Nose: Mucosal edema and rhinorrhea present.  Mouth/Throat: Oropharynx is clear and moist.  Eyes: Conjunctivae and EOM are normal. Pupils are equal, round, and reactive to light. No scleral icterus.  Neck: Normal range of motion. Neck supple. No thyromegaly present.  Cardiovascular: Normal rate, regular  rhythm, normal heart sounds and intact distal pulses.  Exam reveals no gallop and no friction rub.   No murmur heard. Pulses:      Radial pulses are 2+ on the right side, and 2+ on the left side.  Pulmonary/Chest: Effort normal and breath sounds normal.  Lymphadenopathy:       Head (right side): No submental, no submandibular, no tonsillar, no preauricular, no posterior auricular and no occipital adenopathy present.       Head (left side): No submental, no submandibular, no tonsillar, no preauricular, no posterior auricular and no occipital adenopathy present.    She has no cervical adenopathy.       Right: No supraclavicular adenopathy present.       Left: No supraclavicular adenopathy present.  Neurological: She is alert and oriented to person, place, and time.  Skin: Skin is warm, dry and intact.  Psychiatric: She has a normal mood and affect. Her behavior is normal. Judgment and thought content normal.      Assessment & Plan:   1.  Sinus congestion Most likely due to allergies.  Advised to use Flonase daily.  2. PCOS (polycystic ovarian syndrome) Continue on current medications.  3.  Morbid obesity Encouraged to continue to walk 150 min/week, see the RD as scheduled, and work on diet.  4.  Anxiety Well controlled with current medications.  5.  Insulin resistance Continue with diet and exercise changes.  Recheck in one month.

## 2013-07-07 ENCOUNTER — Ambulatory Visit (INDEPENDENT_AMBULATORY_CARE_PROVIDER_SITE_OTHER): Payer: 59 | Admitting: Family Medicine

## 2013-07-07 VITALS — BP 138/92 | HR 100 | Temp 97.7°F | Resp 18 | Wt 271.6 lb

## 2013-07-07 DIAGNOSIS — M79662 Pain in left lower leg: Secondary | ICD-10-CM

## 2013-07-07 DIAGNOSIS — M79609 Pain in unspecified limb: Secondary | ICD-10-CM
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
## 2013-07-07 NOTE — Patient Instructions (Addendum)
I think you have pulled or strained a muscle in your calf.    Treatment is as you described:  Rest, ice, and some sort of compression.  Take your pain relievers to help.  This should be better in the next week or so.  It may ache for a little longer than that.  If you're still having trouble, come back and see us.

## 2013-07-09 ENCOUNTER — Ambulatory Visit: Payer: 59 | Admitting: *Deleted

## 2013-07-31 ENCOUNTER — Encounter: Payer: Self-pay | Admitting: Physician Assistant

## 2013-07-31 ENCOUNTER — Ambulatory Visit (INDEPENDENT_AMBULATORY_CARE_PROVIDER_SITE_OTHER): Payer: 59 | Admitting: Physician Assistant

## 2013-07-31 VITALS — BP 118/64 | HR 80 | Temp 98.0°F | Resp 16 | Ht 64.0 in | Wt 273.6 lb

## 2013-07-31 DIAGNOSIS — E8881 Metabolic syndrome: Secondary | ICD-10-CM

## 2013-07-31 DIAGNOSIS — F341 Dysthymic disorder: Secondary | ICD-10-CM

## 2013-07-31 DIAGNOSIS — Z6841 Body Mass Index (BMI) 40.0 and over, adult: Secondary | ICD-10-CM

## 2013-07-31 DIAGNOSIS — F418 Other specified anxiety disorders: Secondary | ICD-10-CM

## 2013-07-31 LAB — COMPREHENSIVE METABOLIC PANEL
ALBUMIN: 4 g/dL (ref 3.5–5.2)
ALK PHOS: 45 U/L (ref 39–117)
ALT: 34 U/L (ref 0–35)
AST: 22 U/L (ref 0–37)
BUN: 12 mg/dL (ref 6–23)
CO2: 23 mEq/L (ref 19–32)
Calcium: 8.9 mg/dL (ref 8.4–10.5)
Chloride: 102 mEq/L (ref 96–112)
Creat: 0.79 mg/dL (ref 0.50–1.10)
Glucose, Bld: 115 mg/dL — ABNORMAL HIGH (ref 70–99)
POTASSIUM: 4.1 meq/L (ref 3.5–5.3)
SODIUM: 136 meq/L (ref 135–145)
Total Bilirubin: 0.5 mg/dL (ref 0.2–1.2)
Total Protein: 6.5 g/dL (ref 6.0–8.3)

## 2013-07-31 LAB — LIPID PANEL
Cholesterol: 184 mg/dL (ref 0–200)
HDL: 52 mg/dL (ref >39)
LDL CALC: 93 mg/dL (ref 0–99)
Total CHOL/HDL Ratio: 3.5 Ratio
Triglycerides: 195 mg/dL — ABNORMAL HIGH (ref <150)
VLDL: 39 mg/dL (ref 0–40)

## 2013-07-31 LAB — POCT GLYCOSYLATED HEMOGLOBIN (HGB A1C): Hemoglobin A1C: 6.1

## 2013-07-31 LAB — GLUCOSE, POCT (MANUAL RESULT ENTRY): POC Glucose: 118 mg/dl — AB (ref 70–99)

## 2013-07-31 NOTE — Progress Notes (Signed)
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  The patient walks in the deck for exercise, and the woman jumped "outside my window."  She had a very heavy period after stopping the NuvaRing, but otherwise is very happy off it.  She reports that even her thoughts seem clearer.  She's still having trouble sleeping, even with trazodone, but notes that she just recently started taking it regularly. Now that the cat is not waking her up (the cat was in heat, causing the increased nighttime wakening), she hopes she'll improve.  Of note, she was taking the Wellbutrin SR once daily, rather than as divided doses BID.  We discussed taking the doses about 8 hours apart, even 6, to minimize the potential sleep disruption.  Return in about 1 month (around 08/30/2013).

## 2013-07-31 NOTE — Patient Instructions (Addendum)
Keep up the good work. Practice wisdom (the knowing and the doing).  It's hard, and sometimes you'll find yourself off the path.   That doesn't mean you've failed, you just need to correct the course. Try taking the Wellbutrin twice each day, when you get up and then about 8 hours later.  If that doesn't help (or makes your sleep worse), we'll switch you to the XL product which you will take only 1 times each day. Try taking the trazodone each day before bed.

## 2013-07-31 NOTE — Progress Notes (Signed)
   Subjective:    Patient ID: Kerri Ford, female    DOB: 09/24/1980, 33 y.o.   MRN: [id number]  HPI  33y.o female with hx of depression and anxiety presents to clinic for 1 month follow up for insulin resistance.  Pt states "nothing major" has changed between 3/24 office visit.  She strained her calf muscle after dancing one night and diagnosed with a minor R calf strain by Dr. Gwendolyn GrantWalden, which has since resolved.  Pt gained 2 lbs since last office visit, however she has been reaching her goal of 150mins walking per week.  She has been unable to adhere to the nutritionist's 3 meals and 3 snacks per day guidelines due to her busy work schedule.  Pt describes a stressful month with lack of sleep and drop in work production leading to a couple meetings with her boss.  Pt denies increased thirst, increased frequency of urination, abd pain.  Pt also states that lower abd pain has continued to improve since d/c Nuvaring.     Review of Systems  Constitutional: Negative for fever, chills and diaphoresis.  HENT: Negative.   Eyes: Negative.   Respiratory: Negative.   Cardiovascular: Negative.   Gastrointestinal: Negative.   Endocrine: Negative.   Genitourinary: Negative.   Musculoskeletal: Negative.   Skin: Negative for color change and rash.  Allergic/Immunologic: Positive for environmental allergies.  Neurological: Negative.         Objective:   Physical Exam  Constitutional: She is oriented to person, place, and time. She appears well-developed and well-nourished. No distress.  BP 118/64  Pulse 80  Temp(Src) 98 F (36.7 C) (Oral)  Resp 16  Ht 5\' 4"  (1.626 m)  Wt 273 lb 9.6 oz (124.104 kg)  BMI 46.94 kg/m2  SpO2 96%  LMP 07/12/2013   HENT:  Head: Normocephalic.  Eyes: Conjunctivae are normal. Pupils are equal, round, and reactive to light.  Cardiovascular: Normal rate, regular rhythm and normal heart sounds.  Exam reveals no gallop and no friction rub.   No murmur  heard. Pulmonary/Chest: Effort normal and breath sounds normal. No respiratory distress. She has no wheezes.  Neurological: She is alert and oriented to person, place, and time.  Skin: Skin is warm and dry. No rash noted.  Psychiatric: She has a normal mood and affect. Her behavior is normal.    Results for orders placed in visit on 07/31/13  GLUCOSE, POCT (MANUAL RESULT ENTRY)      Result Value Ref Range   POC Glucose 118 (*) 70 - 99 mg/dl  POCT GLYCOSYLATED HEMOGLOBIN (HGB A1C)      Result Value Ref Range   Hemoglobin A1C 6.1          Assessment & Plan:   1. Insulin resistance 2. Morbid obesity with BMI of 45.0-49.9, adult Discussed importance of lifestyle modification.  Pt meeting with Nutritionist again in early May.  Will return for follow up in 1 month. - POCT glucose (manual entry) - POCT glycosylated hemoglobin (Hb A1C) - Comprehensive metabolic panel - Lipid panel   3. Depression with anxiety Will continue to monitor.

## 2013-08-02 ENCOUNTER — Encounter: Payer: Self-pay | Admitting: Physician Assistant

## 2013-08-05 ENCOUNTER — Ambulatory Visit (INDEPENDENT_AMBULATORY_CARE_PROVIDER_SITE_OTHER): Payer: 59 | Admitting: Physician Assistant

## 2013-08-05 VITALS — BP 110/68 | HR 97 | Temp 98.5°F | Resp 20 | Ht 63.0 in | Wt 273.6 lb

## 2013-08-05 DIAGNOSIS — J309 Allergic rhinitis, unspecified: Secondary | ICD-10-CM

## 2013-08-05 MED ORDER — GUAIFENESIN ER 1200 MG PO TB12: 1.0000 | ORAL_TABLET | Freq: Two times a day (BID) | ORAL | Status: DC | PRN

## 2013-08-05 MED ORDER — IPRATROPIUM BROMIDE 0.03 % NA SOLN: 2.0000 | Freq: Two times a day (BID) | NASAL | Status: DC

## 2013-08-05 NOTE — Patient Instructions (Addendum)
Use either the Flonase or Nasonex, but not both, EVERY SINGLE DAY. Use the Atrovent (ipratropium) nasal spray BEFORE the Flonase/Nasonex, and then stop it when the symptoms are controlled. You may then restart it if needed. Use the Mucinex to thin the mucous, and make sure you're drinking at least 64 ounces of water daily.

## 2013-08-05 NOTE — Progress Notes (Signed)
   Subjective:    Patient ID: Woodroe ModeKaren Tomlin, female    DOB: 06/17/1980, 33 y.o.   MRN: [id number]   PCP: Abbe AmsterdamOPLAND,JESSICA, MD  Chief Complaint  Patient presents with  . Sore Throat    glands swollen x  4 days  . Cough    some yellow-greenish sputum    Medications, allergies, past medical history, surgical history, family history, social history and problem list reviewed and updated.  Sore Throat  Associated symptoms include coughing.  Cough    Patient presents with 4 days of nasal congestion, post nasal drainage and cough.  No fever, chills, GI or GU symptoms. Lots of head and facial pressure.  Unable to breathe through her nose, so she's mouth breathing at night and has developed a sore throat.  She spoke with the Nurse-On-Call program offered by her health insurance, and was advised she seek evaluation for possible strep throat.  She has Flonase and Nasonex at home, but doesn't use either regularly. Takes Allegra most days.  Review of Systems  Respiratory: Positive for cough.    As above.    Objective:   Physical Exam  Vitals reviewed. Constitutional: She is oriented to person, place, and time. Vital signs are normal. She appears well-developed and well-nourished. No distress.  HENT:  Head: Normocephalic and atraumatic.  Right Ear: Hearing, tympanic membrane, external ear and ear canal normal.  Left Ear: Hearing, tympanic membrane, external ear and ear canal normal.  Nose: Mucosal edema and rhinorrhea present.  No foreign bodies. Right sinus exhibits maxillary sinus tenderness (mild) and frontal sinus tenderness (mild). Left sinus exhibits maxillary sinus tenderness (mild) and frontal sinus tenderness (mild).  Mouth/Throat: Uvula is midline, oropharynx is clear and moist and mucous membranes are normal. No uvula swelling. No oropharyngeal exudate.  Eyes: Conjunctivae and EOM are normal. Pupils are equal, round, and reactive to light. Right eye exhibits no discharge. Left  eye exhibits no discharge. No scleral icterus.  Neck: Trachea normal, normal range of motion and full passive range of motion without pain. Neck supple. No mass and no thyromegaly present.  Cardiovascular: Normal rate, regular rhythm and normal heart sounds.   Pulmonary/Chest: Effort normal and breath sounds normal.  Lymphadenopathy:       Head (right side): No submandibular, no tonsillar, no preauricular, no posterior auricular and no occipital adenopathy present.       Head (left side): No submandibular, no tonsillar, no preauricular and no occipital adenopathy present.    She has no cervical adenopathy.       Right: No supraclavicular adenopathy present.       Left: No supraclavicular adenopathy present.  Neurological: She is alert and oriented to person, place, and time. She has normal strength. No cranial nerve deficit or sensory deficit.  Skin: Skin is warm, dry and intact. No rash noted.  Psychiatric: She has a normal mood and affect. Her speech is normal and behavior is normal.          Assessment & Plan:  1. Allergic rhinitis Rest, fluids.  Daily steroid nasal spray. Continue daily antihistamine - ipratropium (ATROVENT) 0.03 % nasal spray; Place 2 sprays into both nostrils 2 (two) times daily.  Dispense: 30 mL; Refill: 0 - Guaifenesin (MUCINEX MAXIMUM STRENGTH) 1200 MG TB12; Take 1 tablet (1,200 mg total) by mouth every 12 (twelve) hours as needed.  Dispense: 14 tablet; Refill: 1   Fernande Brashelle S. Lila Lufkin, PA-C Physician Assistant-Certified [facility]

## 2013-08-08 ENCOUNTER — Telehealth: Payer: Self-pay

## 2013-08-08 NOTE — Telephone Encounter (Signed)
PT WOULD LIKE TO SPEAK WITH SOMEONE REGARDING HER DIAGNOSIS. WAS TOLD SHE WOULD FEEL MUCH BETTER BY TODAY BUT SHE DOESN'T  IS HAVING NOSEBLEED ALSO PLEASE CALL [phone]

## 2013-08-08 NOTE — Telephone Encounter (Signed)
Spoke with pt. Not feeling better at all. Has a blister on her throat and is blowing blood out of her nose. She is still very congested and coughing, worse at night. Please advise.

## 2013-08-09 MED ORDER — CEFDINIR 300 MG PO CAPS: 600.0000 mg | ORAL_CAPSULE | Freq: Every day | ORAL | Status: DC

## 2013-08-09 NOTE — Telephone Encounter (Signed)
Please let the patient know that I have sent in an antibiotic.  Meds ordered this encounter  Medications  . cefdinir (OMNICEF) 300 MG capsule    Sig: Take 2 capsules (600 mg total) by mouth daily.    Dispense:  20 capsule    Refill:  0    Order Specific Question:  Supervising Provider    Answer:  DOOLITTLE, ROBERT P [[id number]]

## 2013-08-09 NOTE — Telephone Encounter (Signed)
Advised patient that antibiotic was called into her pharmacy.  She said she will get it tomorrow.

## 2013-08-11 ENCOUNTER — Telehealth: Payer: Self-pay | Admitting: Physician Assistant

## 2013-08-11 NOTE — Telephone Encounter (Signed)
The patient states that the antibiotic is causing a very painful and itchy yeast infection.  The patient is requesting medication to treat yeast infection.  The patient uses [facility] on [address], and may be reached at [phone] with any questions.

## 2013-08-12 MED ORDER — FLUCONAZOLE 150 MG PO TABS: 150.0000 mg | ORAL_TABLET | Freq: Once | ORAL | Status: DC

## 2013-08-12 NOTE — Telephone Encounter (Signed)
Sent in Diflucan 150 #2 R0  Pt advised.

## 2013-08-12 NOTE — Telephone Encounter (Signed)
Patient called again to ask if we can prescribe her something for a yeast infection. She states that besides the yeast infection, she is feeling better. Please return call and advise. Thank you.

## 2013-08-15 ENCOUNTER — Telehealth: Payer: Self-pay

## 2013-08-15 NOTE — Telephone Encounter (Signed)
Pain has subsided. She will RTC tomorrow.

## 2013-08-15 NOTE — Telephone Encounter (Signed)
PATIENT STATES SHE WAS IN THE OFFICE TO SEE CHELLE ON Sunday. SHE PRESCRIBED HER CEFDINIR 300 MG. YESTERDAY SHE STARTED TO NOTICE SHE WAS HAVING SOME (L) SIDE PAIN. IT WAS HARD FOR HER TO SLEEP LAST NIGHT. COULD THIS BE FROM THE MEDICATION? IT HURTS ESPECIALLY WHEN SHE COUGHS. BEST PHONE [phone] (CELL)   PHARMACY CHOICE IS [facility] ON [address].   MBC

## 2013-08-15 NOTE — Telephone Encounter (Signed)
I saw the patient a week ago, and sent the cefdinir in over the weekend.  The medication should not cause insomnia or side pain.  Please advise this patient to return for re-evaluation.

## 2013-08-18 ENCOUNTER — Ambulatory Visit (INDEPENDENT_AMBULATORY_CARE_PROVIDER_SITE_OTHER): Payer: 59 | Admitting: Family Medicine

## 2013-08-18 VITALS — BP 106/76 | HR 87 | Temp 98.7°F | Resp 16 | Ht 63.0 in | Wt 274.4 lb

## 2013-08-18 DIAGNOSIS — R300 Dysuria: Secondary | ICD-10-CM

## 2013-08-18 DIAGNOSIS — N949 Unspecified condition associated with female genital organs and menstrual cycle: Secondary | ICD-10-CM

## 2013-08-18 DIAGNOSIS — R102 Pelvic and perineal pain: Secondary | ICD-10-CM

## 2013-08-18 LAB — POCT CBC
Granulocyte percent: 67 %G (ref 37–80)
HCT, POC: 39.8 % (ref 37.7–47.9)
HEMOGLOBIN: 12.7 g/dL (ref 12.2–16.2)
Lymph, poc: 2.3 (ref 0.6–3.4)
MCH, POC: 28 pg (ref 27–31.2)
MCHC: 31.9 g/dL (ref 31.8–35.4)
MCV: 87.7 fL (ref 80–97)
MID (cbc): 0.5 (ref 0–0.9)
MPV: 9.2 fL (ref 0–99.8)
POC GRANULOCYTE: 5.8 (ref 2–6.9)
POC LYMPH %: 27 % (ref 10–50)
POC MID %: 6 %M (ref 0–12)
Platelet Count, POC: 345 10*3/uL (ref 142–424)
RBC: 4.54 M/uL (ref 4.04–5.48)
RDW, POC: 14.6 %
WBC: 8.6 10*3/uL (ref 4.6–10.2)

## 2013-08-18 LAB — POCT WET PREP WITH KOH
Clue Cells Wet Prep HPF POC: NEGATIVE
KOH PREP POC: NEGATIVE
TRICHOMONAS UA: NEGATIVE
Yeast Wet Prep HPF POC: NEGATIVE

## 2013-08-18 LAB — POCT URINALYSIS DIPSTICK
Bilirubin, UA: NEGATIVE
Blood, UA: NEGATIVE
Glucose, UA: NEGATIVE
Ketones, UA: NEGATIVE
Leukocytes, UA: NEGATIVE
Nitrite, UA: NEGATIVE
Protein, UA: NEGATIVE
Spec Grav, UA: 1.02
Urobilinogen, UA: 0.2
pH, UA: 5.5

## 2013-08-18 LAB — POCT UA - MICROSCOPIC ONLY
Casts, Ur, LPF, POC: NEGATIVE
Crystals, Ur, HPF, POC: NEGATIVE
Mucus, UA: NEGATIVE
Yeast, UA: NEGATIVE
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
## 2013-08-20 ENCOUNTER — Encounter: Payer: Self-pay | Admitting: *Deleted

## 2013-08-20 ENCOUNTER — Encounter: Payer: 59 | Attending: Family Medicine | Admitting: *Deleted

## 2013-08-20 VITALS — Ht 63.0 in | Wt 274.8 lb

## 2013-08-20 DIAGNOSIS — Z6841 Body Mass Index (BMI) 40.0 and over, adult: Secondary | ICD-10-CM | POA: Insufficient documentation

## 2013-08-20 DIAGNOSIS — Z713 Dietary counseling and surveillance: Secondary | ICD-10-CM | POA: Insufficient documentation

## 2013-08-20 DIAGNOSIS — E669 Obesity, unspecified: Secondary | ICD-10-CM | POA: Insufficient documentation

## 2013-08-20 LAB — GC/CHLAMYDIA PROBE AMP
CT PROBE, AMP APTIMA: NEGATIVE
GC Probe RNA: NEGATIVE

## 2013-08-20 LAB — URINE CULTURE
COLONY COUNT: NO GROWTH
Organism ID, Bacteria: NO GROWTH

## 2013-08-20 NOTE — Progress Notes (Signed)
Medical Nutrition Therapy:  Appt start time: 0915 end time:  0945.  Assessment:  Patient here today for a follow up for weight management. Patient reports that she has made many healthy changes. She has dramatically increased consumption of vegetables. She is also preparing healthy meals ahead of time for the week. She has also been able to control binge eating of sweets when feeling depressed. She reports trying to change her mind-set about this, allowing herself to eat sweets and celebrating her self-control. She has reduced intake of soda and cheese. She is also aiming for about 150 minutes of exercise weekly and is averaging 150-200 minutes per week. She does report that she got sick over the last 2-3 weeks and has been unable to exercise or eat as healthy. She has been getting fast food/pizza more often. She would like options for healthy eating if this occurs again.   Weight is down about 4 pounds since January.  MEDICATIONS: See list   DIETARY INTAKE:   Usual eating pattern includes 3 meals and 2-3 snacks per day.  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   Nutritional Diagnosis:  Regan-3.3 Overweight/obesity As related to excessive energy intake and physical inactivity. As evidenced by BMI >40.    Intervention:  Nutrition counseling. Reinforced strategies for weight loss. Patient praised on her efforts to this point. We discussed having a positive mentality with  regards to healthy behaviors (celebrating successes, not feeling guilty about eating less healthy foods). We also discussed quick meal ideas for when patient is sick and unable to prepare healthy meals.   Goals:  1. 1 pound weight loss per week.  2. Continue to eat 3 regular meals with up to 3 healthy snacks.  3. Continue to monitor portion size and increase vegetable intake.  4. Continue exercising at least 150 minutes weekly.  5. Choose healthy frozen dinners, freeze pre-prepped meals, or cook meat/vegetables in toaster oven on tin foil when sick/unable to prepare full meals.  Handouts given during visit include:  Weight loss tips  Meal plan card  Monitoring/Evaluation:  Dietary intake, exercise, and body weight in 3 month(s).

## 2013-09-04 ENCOUNTER — Ambulatory Visit (INDEPENDENT_AMBULATORY_CARE_PROVIDER_SITE_OTHER): Payer: 59 | Admitting: Physician Assistant

## 2013-09-04 ENCOUNTER — Encounter: Payer: Self-pay | Admitting: Physician Assistant

## 2013-09-04 VITALS — BP 110/70 | HR 84 | Temp 98.2°F | Resp 16 | Ht 64.5 in | Wt 273.8 lb

## 2013-09-04 DIAGNOSIS — F411 Generalized anxiety disorder: Secondary | ICD-10-CM

## 2013-09-04 DIAGNOSIS — M542 Cervicalgia: Secondary | ICD-10-CM

## 2013-09-04 DIAGNOSIS — F419 Anxiety disorder, unspecified: Secondary | ICD-10-CM

## 2013-09-04 DIAGNOSIS — Z6841 Body Mass Index (BMI) 40.0 and over, adult: Principal | ICD-10-CM

## 2013-09-04 MED ORDER — TRAZODONE HCL 50 MG PO TABS: ORAL_TABLET | ORAL | Status: DC

## 2013-09-04 NOTE — Progress Notes (Signed)
Subjective:    Patient ID: Kerri Ford, female    DOB: 25-Feb-1981, 33 y.o.   MRN: [id number]   PCP: Abbe Amsterdam, MD  Chief Complaint  Patient presents with  . Follow-up    weight loss  . pressure in neck, hurts to move and twist x 2 days    Medications, allergies, past medical history, surgical history, family history, social history and problem list reviewed and updated.  Patient Active Problem List   Diagnosis Date Noted  . Morbid obesity with BMI of 45.0-49.9, adult 05/20/2012  . Depression with anxiety 12/31/2011  . PCOS (polycystic ovarian syndrome) 08/11/2011  . Diverticulitis of sigmoid colon 10/09/2010   Prior to Admission medications   Medication Sig Start Date End Date Taking? Authorizing Provider  buPROPion [facility] SR) 150 MG 12 hr tablet 2 daily 04/26/13  Yes Ithzel Fedorchak S Neils Siracusa, PA-C  clonazePAM (KLONOPIN) 0.5 MG tablet 1/2-1 tablet twice daily prn anxiety 12/29/11  Yes Marzella Schlein McClung, PA-C  dicyclomine (BENTYL) 20 MG tablet Take 1 tablet (20 mg total) by mouth every 6 (six) hours. 09/12/12  Yes Jessica C Copland, MD  diltiazem 2 % GEL Apply 1 application topically 2 (two) times daily. 06/28/13  Yes Thao P Le, DO  fluticasone (FLONASE) 50 MCG/ACT nasal spray Place 2 sprays into both nostrils daily. 06/28/13  Yes Thao P Le, DO  hydrocortisone (ANUSOL-HC) 2.5 % rectal cream Place 1 application rectally 3 (three) times daily as needed for hemorrhoids or itching. 06/28/13  Yes Thao P Le, DO  ketoprofen (ORUDIS) 50 MG capsule Take 1 capsule (50 mg total) by mouth 3 (three) times daily as needed for pain. As needed for migraine headache. 09/12/12  Yes Jessica C Copland, MD  lidocaine (XYLOCAINE) 5 % ointment Apply 1 application topically as needed. 06/28/13  Yes Thao P Le, DO  loratadine (CLARITIN) 10 MG tablet Take 10 mg by mouth daily.   Yes Historical Provider, MD  metFORMIN (GLUCOPHAGE) 500 MG tablet Take 1 tablet (500 mg total) by mouth 2 (two) times daily with a meal.  04/26/13  Yes Talitha Dicarlo S Khalessi Blough, PA-C  Multiple Vitamins-Minerals (WOMENS MULTI VITAMIN & MINERAL PO) Take by mouth.   Yes Historical Provider, MD  OVER THE COUNTER MEDICATION Digestive Probiotic taking   Yes Historical Provider, MD  Phenazopyridine HCl (AZO TABS PO) Take by mouth.   Yes Historical Provider, MD  Pyridoxine HCl (VITAMIN B-6) 250 MG tablet Take 250 mg by mouth daily.   Yes Historical Provider, MD  traZODone (DESYREL) 50 MG tablet 1 to 3 tabs hs prn sleep 12/29/11  Yes Anders Simmonds, PA-C      HPI  Looking to move to a new apartment. Thinks that liking where she lives will help her reach her health goals. She's checked out a number of places. Her current lease is up 7/31, and her goal is to move by 7/01. She reports that she's never liked where she's lived before.   Has had some intermittent LLQ abdominal pain, and some diarrheal episodes.  Certainly triggered by stressors. Has been told she likely has IBS, but hasn't seen GI in quite some time.  [place] the batting [place] on 5[place]23, and then on 5/24 awoke with neck pain.  "I think I slept on it wrong." has taken a dose of Flexeril and notes that she has less pain and better ROM than she did yesterday.  Has gotten some good tips from the nutritionist, especially with regard to eating choices when  she doesn't have time to cook.  Trying new recipes. Doing better with portion control-purchased some 1/2 cup containers to fill with her snacks/lunch for work.  Her recent cough has resolved.   Review of Systems As above.    Objective:   Physical Exam  Vitals reviewed. Constitutional: She is oriented to person, place, and time. Vital signs are normal. She appears well-developed and well-nourished. She is active and cooperative. No distress.  BP 110/70  Pulse 84  Temp(Src) 98.2 F (36.8 C) (Oral)  Resp 16  Ht 5' 4.5" (1.638 m)  Wt 273 lb 12.8 oz (124.195 kg)  BMI 46.29 kg/m2  SpO2 97%  LMP 08/12/2013  HENT:  Head:  Normocephalic and atraumatic.  Right Ear: Hearing normal.  Left Ear: Hearing normal.  Eyes: Conjunctivae are normal. No scleral icterus.  Neck: Normal range of motion. Neck supple. No thyromegaly present.  Cardiovascular: Normal rate, regular rhythm and normal heart sounds.   Pulses:      Radial pulses are 2+ on the right side, and 2+ on the left side.  Pulmonary/Chest: Effort normal and breath sounds normal.  Musculoskeletal:       Cervical back: Normal.       Back:  Mild tenderness with palpation of the muscles lateral to the lower cervical/uppper thoracic spine. No bony tenderness.  Full ROM, with "pulling" sensation.  Lymphadenopathy:       Head (right side): No tonsillar, no preauricular, no posterior auricular and no occipital adenopathy present.       Head (left side): No tonsillar, no preauricular, no posterior auricular and no occipital adenopathy present.    She has no cervical adenopathy.       Right: No supraclavicular adenopathy present.       Left: No supraclavicular adenopathy present.  Neurological: She is alert and oriented to person, place, and time. No sensory deficit.  Skin: Skin is warm, dry and intact. No rash noted. No cyanosis or erythema. Nails show no clubbing.  Psychiatric: She has a normal mood and affect.          Assessment & Plan:  1. Morbid obesity with BMI of 45.0-49.9, adult Continue efforts for healthier eating and increased exercise.  2. Anxiety Improving, though still a major player in her life happiness. - traZODone (DESYREL) 50 MG tablet; 1 to 3 tabs hs prn sleep  Dispense: 90 tablet; Refill: 3  3. Neck pain Musculoskeletal strain.  NSAIDS. OK to take flexeril.  Warm compresses. Gentle ROM.   Fernande Brashelle S. Breella Vanostrand, PA-C Physician Assistant-Certified [facility]

## 2013-09-04 NOTE — Patient Instructions (Signed)
Try a heating pad to the neck, and make sure that you do some range of motion exercises to stretch the muscles.  It is ok to take flexeril (cyclobenzaprine) as needed.

## 2013-09-17 ENCOUNTER — Other Ambulatory Visit: Payer: Self-pay

## 2013-09-17 DIAGNOSIS — F419 Anxiety disorder, unspecified: Secondary | ICD-10-CM

## 2013-09-17 MED ORDER — TRAZODONE HCL 50 MG PO TABS: ORAL_TABLET | ORAL | Status: DC

## 2013-10-01 ENCOUNTER — Ambulatory Visit (INDEPENDENT_AMBULATORY_CARE_PROVIDER_SITE_OTHER): Payer: 59 | Admitting: Family Medicine

## 2013-10-01 ENCOUNTER — Telehealth: Payer: Self-pay | Admitting: Family Medicine

## 2013-10-01 VITALS — BP 126/88 | HR 91 | Temp 98.6°F | Resp 16 | Ht 64.0 in | Wt 270.4 lb

## 2013-10-01 DIAGNOSIS — B029 Zoster without complications: Secondary | ICD-10-CM

## 2013-10-01 MED ORDER — VALACYCLOVIR HCL 1 G PO TABS: 1000.0000 mg | ORAL_TABLET | Freq: Three times a day (TID) | ORAL | Status: DC
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
## 2013-10-01 NOTE — Telephone Encounter (Signed)
Patient seen today for shingles and needs more defined work note. Spoke with Dr. Patsy Lageropland and she will need to be out until Wednesday, return to work on Thursday. Patient notified and voiced understanding. She stated at this time she did not need a new work note she would let them know and if they needed it she would let us know and come by and pick up.

## 2013-10-01 NOTE — Patient Instructions (Addendum)
Congrats on your weight loss!  You are doing great!  You do have shingles- take the valtrex three times a day for one week.  Let me know if you are not better soon.

## 2013-11-12 ENCOUNTER — Ambulatory Visit: Payer: 59 | Admitting: Family Medicine

## 2013-11-13 ENCOUNTER — Ambulatory Visit: Payer: 59 | Admitting: Physician Assistant

## 2013-11-26 ENCOUNTER — Ambulatory Visit: Payer: 59 | Admitting: *Deleted

## 2013-12-03 ENCOUNTER — Ambulatory Visit (INDEPENDENT_AMBULATORY_CARE_PROVIDER_SITE_OTHER): Payer: 59

## 2013-12-03 ENCOUNTER — Ambulatory Visit (INDEPENDENT_AMBULATORY_CARE_PROVIDER_SITE_OTHER): Payer: 59 | Admitting: Internal Medicine

## 2013-12-03 VITALS — BP 118/68 | HR 91 | Temp 98.1°F | Resp 16 | Ht 64.0 in | Wt 273.2 lb

## 2013-12-03 DIAGNOSIS — R1084 Generalized abdominal pain: Secondary | ICD-10-CM

## 2013-12-03 DIAGNOSIS — R110 Nausea: Secondary | ICD-10-CM

## 2013-12-03 DIAGNOSIS — G43909 Migraine, unspecified, not intractable, without status migrainosus: Secondary | ICD-10-CM | POA: Insufficient documentation

## 2013-12-03 DIAGNOSIS — R197 Diarrhea, unspecified: Secondary | ICD-10-CM

## 2013-12-03 LAB — POCT URINALYSIS DIPSTICK
Bilirubin, UA: NEGATIVE
Blood, UA: NEGATIVE
GLUCOSE UA: NEGATIVE
Ketones, UA: NEGATIVE
Leukocytes, UA: NEGATIVE
NITRITE UA: NEGATIVE
PROTEIN UA: NEGATIVE
SPEC GRAV UA: 1.025
UROBILINOGEN UA: 0.2
pH, UA: 5

## 2013-12-03 LAB — POCT UA - MICROSCOPIC ONLY
BACTERIA, U MICROSCOPIC: NEGATIVE
CASTS, UR, LPF, POC: NEGATIVE
CRYSTALS, UR, HPF, POC: NEGATIVE
Mucus, UA: NEGATIVE
YEAST UA: NEGATIVE

## 2013-12-03 MED ORDER — ONDANSETRON 8 MG PO TBDP: 8.0000 mg | ORAL_TABLET | Freq: Three times a day (TID) | ORAL | Status: DC | PRN

## 2013-12-03 MED ORDER — RANITIDINE HCL 150 MG PO TABS: 150.0000 mg | ORAL_TABLET | Freq: Two times a day (BID) | ORAL | Status: DC
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
## 2013-12-03 NOTE — Patient Instructions (Addendum)
Drink at least 64 ounces of water daily. Increase the fiber in your diet (whole grains, fruit, vegetables, legumes). Reduce the empty calories and high fat foods.  I will contact you with your lab results as soon as they are available.   If you have not heard from me in 2 weeks, please contact me.  The fastest way to get your results is to register for My Chart (see the instructions on the last page of this printout).

## 2013-12-05 LAB — CLOSTRIDIUM DIFFICILE BY PCR: Toxigenic C. Difficile by PCR: NOT DETECTED

## 2013-12-06 LAB — OVA AND PARASITE EXAMINATION: OP: NONE SEEN

## 2013-12-08 ENCOUNTER — Telehealth: Payer: Self-pay | Admitting: Family Medicine

## 2013-12-08 LAB — STOOL CULTURE

## 2013-12-08 NOTE — Telephone Encounter (Signed)
She called the answering service because she was bitten by a cat (feral) yesterday.  The cat is in the care of someone else but is not loose as far as she knows.  Tdap is UTD.  No fever but her hand is sore and red.  Advised her to go to the ER as these bites can become severely infected fast.  She declines but did agree to come in tomorrow to clinic for exam, abx and to do a bite report. Explained that we also do need to follow-up on any rabies risk so a bite report is necessary

## 2013-12-09 ENCOUNTER — Ambulatory Visit (INDEPENDENT_AMBULATORY_CARE_PROVIDER_SITE_OTHER): Payer: 59 | Admitting: Internal Medicine

## 2013-12-09 VITALS — BP 122/80 | HR 86 | Temp 97.4°F | Resp 13 | Ht 64.0 in | Wt 274.0 lb

## 2013-12-09 DIAGNOSIS — W5501XA Bitten by cat, initial encounter: Secondary | ICD-10-CM

## 2013-12-09 DIAGNOSIS — M79646 Pain in unspecified finger(s): Secondary | ICD-10-CM

## 2013-12-09 DIAGNOSIS — IMO0001 Reserved for inherently not codable concepts without codable children: Secondary | ICD-10-CM

## 2013-12-09 DIAGNOSIS — T148XXA Other injury of unspecified body region, initial encounter: Secondary | ICD-10-CM

## 2013-12-09 DIAGNOSIS — IMO0002 Reserved for concepts with insufficient information to code with codable children: Secondary | ICD-10-CM

## 2013-12-09 DIAGNOSIS — M79609 Pain in unspecified limb: Secondary | ICD-10-CM

## 2013-12-09 MED ORDER — AMOXICILLIN-POT CLAVULANATE 875-125 MG PO TABS: 1.0000 | ORAL_TABLET | Freq: Two times a day (BID) | ORAL | Status: DC

## 2013-12-09 MED ORDER — MUPIROCIN 2 % EX OINT: 1.0000 "application " | TOPICAL_OINTMENT | Freq: Three times a day (TID) | CUTANEOUS | Status: DC

## 2013-12-09 NOTE — Patient Instructions (Signed)
Cellulitis Cellulitis is an infection of the skin and the tissue beneath it. The infected area is usually red and tender. Cellulitis occurs most often in the arms and lower legs.  CAUSES  Cellulitis is caused by bacteria that enter the skin through cracks or cuts in the skin. The most common types of bacteria that cause cellulitis are staphylococci and streptococci. SIGNS AND SYMPTOMS   Redness and warmth.  Swelling.  Tenderness or pain.  Fever. DIAGNOSIS  Your health care provider can usually determine what is wrong based on a physical exam. Blood tests may also be done. TREATMENT  Treatment usually involves taking an antibiotic medicine. HOME CARE INSTRUCTIONS   Take your antibiotic medicine as directed by your health care provider. Finish the antibiotic even if you start to feel better.  Keep the infected arm or leg elevated to reduce swelling.  Apply a warm cloth to the affected area up to 4 times per day to relieve pain.  Take medicines only as directed by your health care provider.  Keep all follow-up visits as directed by your health care provider. SEEK MEDICAL CARE IF:   You notice red streaks coming from the infected area.  Your red area gets larger or turns dark in color.  Your bone or joint underneath the infected area becomes painful after the skin has healed.  Your infection returns in the same area or another area.  You notice a swollen bump in the infected area.  You develop new symptoms.  You have a fever. SEEK IMMEDIATE MEDICAL CARE IF:   You feel very sleepy.  You develop vomiting or diarrhea.  You have a general ill feeling (malaise) with muscle aches and pains. MAKE SURE YOU:   Understand these instructions.  Will watch your condition.  Will get help right away if you are not doing well or get worse. Document Released: 01/06/2005 Document Revised: 08/13/2013 Document Reviewed: 06/14/2011 ExitCare Patient Information 2015 [employer].  This information is not intended to replace advice given to you by your health care provider. Make sure you discuss any questions you have with your health care provider. Animal Bite An animal bite can result in a scratch on the skin, deep open cut, puncture of the skin, crush injury, or tearing away of the skin or a body part. Dogs are responsible for most animal bites. Children are bitten more often than adults. An animal bite can range from very mild to more serious. A small bite from your house pet is no cause for alarm. However, some animal bites can become infected or injure a bone or other tissue. You must seek medical care if:  The skin is broken and bleeding does not slow down or stop after 15 minutes.  The puncture is deep and difficult to clean (such as a cat bite).  Pain, warmth, redness, or pus develops around the wound.  The bite is from a stray animal or rodent. There may be a risk of rabies infection.  The bite is from a snake, raccoon, skunk, fox, coyote, or bat. There may be a risk of rabies infection.  The person bitten has a chronic illness such as diabetes, liver disease, or cancer, or the person takes medicine that lowers the immune system.  There is concern about the location and severity of the bite. It is important to clean and protect an animal bite wound right away to prevent infection. Follow these steps:  Clean the wound with plenty of water and soap.    Apply an antibiotic cream.  Apply gentle pressure over the wound with a clean towel or gauze to slow or stop bleeding.  Elevate the affected area above the heart to help stop any bleeding.  Seek medical care. Getting medical care within 8 hours of the animal bite leads to the best possible outcome. DIAGNOSIS  Your caregiver will most likely:  Take a detailed history of the animal and the bite injury.  Perform a wound exam.  Take your medical history. Blood tests or X-rays may be performed. Sometimes,  infected bite wounds are cultured and sent to a lab to identify the infectious bacteria.  TREATMENT  Medical treatment will depend on the location and type of animal bite as well as the patient's medical history. Treatment may include:  Wound care, such as cleaning and flushing the wound with saline solution, bandaging, and elevating the affected area.  Antibiotics.  Tetanus immunization.  Rabies immunization.  Leaving the wound open to heal. This is often done with animal bites, due to the high risk of infection. However, in certain cases, wound closure with stitches, wound adhesive, skin adhesive strips, or staples may be used. Infected bites that are left untreated may require intravenous (IV) antibiotics and surgical treatment in the hospital. HOME CARE INSTRUCTIONS  Follow your caregiver's instructions for wound care.  Take all medicines as directed.  If your caregiver prescribes antibiotics, take them as directed. Finish them even if you start to feel better.  Follow up with your caregiver for further exams or immunizations as directed. You may need a tetanus shot if:  You cannot remember when you had your last tetanus shot.  You have never had a tetanus shot.  The injury broke your skin. If you get a tetanus shot, your arm may swell, get red, and feel warm to the touch. This is common and not a problem. If you need a tetanus shot and you choose not to have one, there is a rare chance of getting tetanus. Sickness from tetanus can be serious. SEEK MEDICAL CARE IF:  You notice warmth, redness, soreness, swelling, pus discharge, or a bad smell coming from the wound.  You have a red line on the skin coming from the wound.  You have a fever, chills, or a general ill feeling.  You have nausea or vomiting.  You have continued or worsening pain.  You have trouble moving the injured part.  You have other questions or concerns. MAKE SURE YOU:  Understand these  instructions.  Will watch your condition.  Will get help right away if you are not doing well or get worse. Document Released: 12/15/2010 Document Revised: 06/21/2011 Document Reviewed: 12/15/2010 ExitCare Patient Information 2015 [employer]. This information is not intended to replace advice given to you by your health care provider. Make sure you discuss any questions you have with your health care provider.  

## 2013-12-09 NOTE — Progress Notes (Signed)
   Subjective:    Patient ID: Kerri Ford, female    DOB: 1980-09-12, 33 y.o.   MRN: [id number]  HPI  Patient is being seen at [facility] for kitten bit. It happen on 12/07/13 about 10:00 pm. Rescuing feral kitten. He bit her a few times on both hands. Left index finger have 4 punctures and right index finger 5 punctures. Feels a little sore. Tender to the touch. Did use neosporin and salt bath. Have not taken any OTC. Td 2012. No allergy to antibiotic  Review of Systems     Objective:   Physical Exam  Constitutional: She is oriented to person, place, and time. She appears well-developed and well-nourished. No distress.  HENT:  Head: Normocephalic.  Eyes: EOM are normal. Pupils are equal, round, and reactive to light.  Neck: Normal range of motion.  Musculoskeletal: She exhibits edema and tenderness.  Neurological: She is alert and oriented to person, place, and time. No cranial nerve deficit. She exhibits normal muscle tone. Coordination normal.  Skin: Ecchymosis and rash noted. Rash is pustular. There is erythema.     Multiple punctures distal index right and left with erythema, swelling, and pain  Psychiatric: She has a normal mood and affect. Judgment and thought content normal.          Assessment & Plan:  Cat bite with cellulitis both index fingers Augmentin 875 bid/mupirocin/Salt water soaks

## 2013-12-10 ENCOUNTER — Encounter (HOSPITAL_COMMUNITY): Payer: Self-pay | Admitting: Emergency Medicine

## 2013-12-10 ENCOUNTER — Emergency Department (HOSPITAL_COMMUNITY)
Admission: EM | Admit: 2013-12-10 | Discharge: 2013-12-10 | Disposition: A | Discharge status: Home / Self Care | Payer: 59 | Attending: Emergency Medicine | Admitting: Emergency Medicine

## 2013-12-10 DIAGNOSIS — Y929 Unspecified place or not applicable: Secondary | ICD-10-CM | POA: Insufficient documentation

## 2013-12-10 DIAGNOSIS — IMO0002 Reserved for concepts with insufficient information to code with codable children: Secondary | ICD-10-CM | POA: Diagnosis not present

## 2013-12-10 DIAGNOSIS — IMO0001 Reserved for inherently not codable concepts without codable children: Secondary | ICD-10-CM | POA: Insufficient documentation

## 2013-12-10 DIAGNOSIS — W5501XA Bitten by cat, initial encounter: Secondary | ICD-10-CM

## 2013-12-10 DIAGNOSIS — F3289 Other specified depressive episodes: Secondary | ICD-10-CM | POA: Insufficient documentation

## 2013-12-10 DIAGNOSIS — Z79899 Other long term (current) drug therapy: Secondary | ICD-10-CM | POA: Insufficient documentation

## 2013-12-10 DIAGNOSIS — Z792 Long term (current) use of antibiotics: Secondary | ICD-10-CM | POA: Insufficient documentation

## 2013-12-10 DIAGNOSIS — Z91040 Latex allergy status: Secondary | ICD-10-CM | POA: Insufficient documentation

## 2013-12-10 DIAGNOSIS — S61209A Unspecified open wound of unspecified finger without damage to nail, initial encounter: Secondary | ICD-10-CM | POA: Insufficient documentation

## 2013-12-10 DIAGNOSIS — F329 Major depressive disorder, single episode, unspecified: Secondary | ICD-10-CM | POA: Insufficient documentation

## 2013-12-10 DIAGNOSIS — Y9389 Activity, other specified: Secondary | ICD-10-CM | POA: Insufficient documentation

## 2013-12-10 DIAGNOSIS — G43909 Migraine, unspecified, not intractable, without status migrainosus: Secondary | ICD-10-CM | POA: Insufficient documentation

## 2013-12-10 DIAGNOSIS — F411 Generalized anxiety disorder: Secondary | ICD-10-CM | POA: Insufficient documentation

## 2013-12-10 DIAGNOSIS — Z23 Encounter for immunization: Secondary | ICD-10-CM

## 2013-12-10 DIAGNOSIS — E669 Obesity, unspecified: Secondary | ICD-10-CM | POA: Diagnosis not present

## 2013-12-10 HISTORY — DX: Polycystic ovarian syndrome: E28.2

## 2013-12-10 HISTORY — DX: Obesity, unspecified: E66.9

## 2013-12-10 MED ORDER — RABIES VACCINE, PCEC IM SUSR
1.0000 mL | Freq: Once | INTRAMUSCULAR | Status: AC
  Administered 2013-12-10: 1 mL via INTRAMUSCULAR
  Filled 2013-12-10: qty 1

## 2013-12-10 MED ORDER — RABIES IMMUNE GLOBULIN 150 UNIT/ML IM INJ
20.0000 [IU]/kg | INJECTION | Freq: Once | INTRAMUSCULAR | Status: AC
  Administered 2013-12-10: 2475 [IU] via INTRAMUSCULAR
  Filled 2013-12-10: qty 16.5
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
## 2013-12-10 NOTE — ED Notes (Signed)
Pt here for rabies treatment after being bitten by a feral kitten 3 days ago. Was seen at [facility] and treated with Augmentin.

## 2013-12-10 NOTE — Discharge Instructions (Signed)
Read the information below. You may return to the Emergency Department at any time for worsening condition or any new symptoms that concern you.  If you develop redness, swelling, pus draining from the wound, or fevers greater than 100.4, return to the ER immediately for a recheck.    Please go to [facility] for the remainder of your Rabies Vaccination series.  Please follow the schedule given to you.   Puncture Wound A puncture wound is an injury that extends through all layers of the skin and into the tissue beneath the skin (subcutaneous tissue). Puncture wounds become infected easily because germs often enter the body and go beneath the skin during the injury. Having a deep wound with a small entrance point makes it difficult for your caregiver to adequately clean the wound. This is especially true if you have stepped on a nail and it has passed through a dirty shoe or other situations where the wound is obviously contaminated. CAUSES  Many puncture wounds involve glass, nails, splinters, fish hooks, or other objects that enter the skin (foreign bodies). A puncture wound may also be caused by a human bite or animal bite. DIAGNOSIS  A puncture wound is usually diagnosed by your history and a physical exam. You may need to have an X-ray or an ultrasound to check for any foreign bodies still in the wound. TREATMENT   Your caregiver will clean the wound as thoroughly as possible. Depending on the location of the wound, a bandage (dressing) may be applied.  Your caregiver might prescribe antibiotic medicines.  You may need a follow-up visit to check on your wound. Follow all instructions as directed by your caregiver. HOME CARE INSTRUCTIONS   Change your dressing once per day, or as directed by your caregiver. If the dressing sticks, it may be removed by soaking the area in water.  If your caregiver has given you follow-up instructions, it is very important that you return for a  follow-up appointment. Not following up as directed could result in a chronic or permanent injury, pain, and disability.  Only take over-the-counter or prescription medicines for pain, discomfort, or fever as directed by your caregiver.  If you are given antibiotics, take them as directed. Finish them even if you start to feel better. You may need a tetanus shot if:  You cannot remember when you had your last tetanus shot.  You have never had a tetanus shot. If you got a tetanus shot, your arm may swell, get red, and feel warm to the touch. This is common and not a problem. If you need a tetanus shot and you choose not to have one, there is a rare chance of getting tetanus. Sickness from tetanus can be serious. You may need a rabies shot if an animal bite caused your puncture wound. SEEK MEDICAL CARE IF:   You have redness, swelling, or increasing pain in the wound.  You have red streaks going away from the wound.  You notice a bad smell coming from the wound or dressing.  You have yellowish-white fluid (pus) coming from the wound.  You are treated with an antibiotic for infection, but the infection is not getting better.  You notice something in the wound, such as rubber from your shoe, cloth, or another object.  You have a fever.  You have severe pain.  You have difficulty breathing.  You feel dizzy or faint.  You cannot stop vomiting.  You lose feeling, develop numbness, or  cannot move a limb below the wound.  Your symptoms worsen. MAKE SURE YOU:  Understand these instructions.  Will watch your condition.  Will get help right away if you are not doing well or get worse. Document Released: 01/06/2005 Document Revised: 06/21/2011 Document Reviewed: 09/15/2010 [facility] Patient Information 2015 [place], [place]. This information is not intended to replace advice given to you by your health care provider. Make sure you discuss any questions you have with your health care  provider.

## 2013-12-10 NOTE — ED Notes (Signed)
Declined W/C at D/C and was escorted to lobby by RN. 

## 2013-12-10 NOTE — ED Notes (Signed)
Pt reports Friday night was bitten by a wild kitten. Has multiple bite marks to end of right pointer finger and 2 bite marks to left pointer finger. Was seen at [facility] yesterday [place] is taking augmentin. Was called today by animal control to get rabies series. Pt is a x 4.

## 2013-12-12 ENCOUNTER — Encounter (HOSPITAL_COMMUNITY): Payer: Self-pay | Admitting: Emergency Medicine

## 2013-12-12 ENCOUNTER — Emergency Department (HOSPITAL_COMMUNITY)
Admission: EM | Admit: 2013-12-12 | Discharge: 2013-12-12 | Disposition: A | Discharge status: Home / Self Care | Payer: 59 | Source: Home / Self Care

## 2013-12-12 DIAGNOSIS — Z203 Contact with and (suspected) exposure to rabies: Secondary | ICD-10-CM

## 2013-12-12 MED ORDER — RABIES VACCINE, PCEC IM SUSR
1.0000 mL | Freq: Once | INTRAMUSCULAR | Status: AC
  Administered 2013-12-12: 1 mL via INTRAMUSCULAR

## 2013-12-12 MED ORDER — RABIES VACCINE, PCEC IM SUSR
INTRAMUSCULAR | Status: AC
  Filled 2013-12-12: qty 1

## 2013-12-12 NOTE — ED Notes (Signed)
Here for rabies  Vaccine      Mildly  Sore   At     This time

## 2013-12-12 NOTE — Discharge Instructions (Signed)
Return as  Directed  For  Your next  Injection  Return  Sooner if any  Problems

## 2013-12-13 NOTE — ED Provider Notes (Signed)
Medical screening examination/treatment/procedure(s) were performed by non-physician practitioner and as supervising physician I was immediately available for consultation/collaboration.   EKG Interpretation None       Derwood Kaplan, MD 12/13/13 [phone]

## 2013-12-16 ENCOUNTER — Emergency Department (HOSPITAL_COMMUNITY)
Admission: EM | Admit: 2013-12-16 | Discharge: 2013-12-16 | Disposition: A | Discharge status: Home / Self Care | Payer: 59 | Source: Home / Self Care

## 2013-12-16 DIAGNOSIS — Z203 Contact with and (suspected) exposure to rabies: Secondary | ICD-10-CM

## 2013-12-16 MED ORDER — RABIES VACCINE, PCEC IM SUSR
1.0000 mL | Freq: Once | INTRAMUSCULAR | Status: AC
  Administered 2013-12-16: 1 mL via INTRAMUSCULAR

## 2013-12-16 MED ORDER — RABIES VACCINE, PCEC IM SUSR
INTRAMUSCULAR | Status: AC
  Filled 2013-12-16: qty 1

## 2013-12-16 NOTE — ED Notes (Signed)
Patient here today for third Rabies injection. Patient received 3 rd injection, 1.0 ml IM left deltoid, RabAvert. Patient tolerated well.

## 2013-12-23 ENCOUNTER — Emergency Department (INDEPENDENT_AMBULATORY_CARE_PROVIDER_SITE_OTHER)
Admission: EM | Admit: 2013-12-23 | Discharge: 2013-12-23 | Disposition: A | Discharge status: Home / Self Care | Payer: 59 | Source: Home / Self Care

## 2013-12-23 ENCOUNTER — Encounter (HOSPITAL_COMMUNITY): Payer: Self-pay | Admitting: Emergency Medicine

## 2013-12-23 DIAGNOSIS — Z203 Contact with and (suspected) exposure to rabies: Secondary | ICD-10-CM

## 2013-12-23 MED ORDER — RABIES VACCINE, PCEC IM SUSR
INTRAMUSCULAR | Status: AC
  Filled 2013-12-23: qty 1

## 2013-12-23 MED ORDER — RABIES VACCINE, PCEC IM SUSR
1.0000 mL | Freq: Once | INTRAMUSCULAR | Status: AC
  Administered 2013-12-23: 1 mL via INTRAMUSCULAR

## 2013-12-23 NOTE — ED Notes (Signed)
Here for last rabies shot in series.  States she was bit by a Public affairs consultant.

## 2013-12-23 NOTE — Discharge Instructions (Signed)
Congratulations, you have finished your rabies series.  Call if any problems.  If any further rabies exposures, you should only need a booster shot.

## 2013-12-24 ENCOUNTER — Ambulatory Visit (INDEPENDENT_AMBULATORY_CARE_PROVIDER_SITE_OTHER): Payer: 59 | Admitting: Family Medicine

## 2013-12-24 ENCOUNTER — Ambulatory Visit: Payer: 59 | Admitting: Family Medicine

## 2013-12-24 ENCOUNTER — Encounter: Payer: Self-pay | Admitting: Family Medicine

## 2013-12-24 VITALS — BP 125/68 | HR 99 | Temp 97.9°F | Resp 18 | Ht 64.5 in | Wt 275.0 lb

## 2013-12-24 DIAGNOSIS — Z23 Encounter for immunization: Secondary | ICD-10-CM

## 2013-12-24 DIAGNOSIS — E282 Polycystic ovarian syndrome: Secondary | ICD-10-CM

## 2013-12-24 DIAGNOSIS — E119 Type 2 diabetes mellitus without complications: Secondary | ICD-10-CM

## 2013-12-24 DIAGNOSIS — Z0000 Encounter for general adult medical examination without abnormal findings: Secondary | ICD-10-CM

## 2013-12-24 LAB — COMPREHENSIVE METABOLIC PANEL
ALK PHOS: 53 U/L (ref 39–117)
ALT: 25 U/L (ref 0–35)
AST: 20 U/L (ref 0–37)
Albumin: 4.2 g/dL (ref 3.5–5.2)
BILIRUBIN TOTAL: 0.4 mg/dL (ref 0.2–1.2)
BUN: 13 mg/dL (ref 6–23)
CO2: 24 mEq/L (ref 19–32)
CREATININE: 0.65 mg/dL (ref 0.50–1.10)
Calcium: 9.1 mg/dL (ref 8.4–10.5)
Chloride: 102 mEq/L (ref 96–112)
Glucose, Bld: 121 mg/dL — ABNORMAL HIGH (ref 70–99)
Potassium: 4.3 mEq/L (ref 3.5–5.3)
Sodium: 137 mEq/L (ref 135–145)
TOTAL PROTEIN: 6.7 g/dL (ref 6.0–8.3)

## 2013-12-24 LAB — CBC
HCT: 37.5 % (ref 36.0–46.0)
Hemoglobin: 12.5 g/dL (ref 12.0–15.0)
MCH: 28 pg (ref 26.0–34.0)
MCHC: 33.3 g/dL (ref 30.0–36.0)
MCV: 84.1 fL (ref 78.0–100.0)
Platelets: 356 10*3/uL (ref 150–400)
RBC: 4.46 MIL/uL (ref 3.87–5.11)
RDW: 13.4 % (ref 11.5–15.5)
WBC: 7.7 10*3/uL (ref 4.0–10.5)

## 2013-12-24 LAB — HEMOGLOBIN A1C
HEMOGLOBIN A1C: 6.5 % — AB (ref <5.7)
Mean Plasma Glucose: 140 mg/dL — ABNORMAL HIGH (ref <117)
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
## 2013-12-24 NOTE — Patient Instructions (Addendum)
Good to see you today! I will be in touch with your labs asap.  Continue to work on diet and exercise.  We can do a pap the next time we see you.  Let me know if you would like another GI opinion and I can set it up for you.

## 2013-12-25 ENCOUNTER — Encounter: Payer: Self-pay | Admitting: Family Medicine

## 2013-12-25 DIAGNOSIS — E119 Type 2 diabetes mellitus without complications: Secondary | ICD-10-CM | POA: Insufficient documentation

## 2013-12-26 ENCOUNTER — Telehealth: Payer: Self-pay

## 2013-12-26 NOTE — Telephone Encounter (Signed)
COPLAND - She got your message yesterday and she has called to set up an appointment with a gastrologist.  She also plans to change her diet some come down on her diabetes numbers.  [phone]

## 2013-12-26 NOTE — Telephone Encounter (Signed)
Is there any other information you would like [name] to relay to the patient?

## 2013-12-27 NOTE — Telephone Encounter (Signed)
Called her back- went over questions with her.  She is going to see GI and work on her diabetes (newly dx) and will come and see me in a few months.

## 2014-01-25 ENCOUNTER — Other Ambulatory Visit: Payer: Self-pay | Admitting: Physician Assistant

## 2014-01-25 ENCOUNTER — Telehealth: Payer: Self-pay

## 2014-01-25 NOTE — Telephone Encounter (Signed)
Called by Trenda MootsMichelle Farrington RTR; this is some sort of nutritional system that includes shakes and supplements.  Let her know that we do not really know about this system and cannot comment about it's safety.  She took this under advisement

## 2014-01-25 NOTE — Telephone Encounter (Signed)
Patient is wanting to let you know that she is starting [employer] and wanted to check with Dr Kerri Ford to be sure this was ok.

## 2014-02-05 ENCOUNTER — Encounter: Payer: 59 | Attending: Family Medicine | Admitting: Dietician

## 2014-02-05 VITALS — Wt 262.1 lb

## 2014-02-05 DIAGNOSIS — Z6841 Body Mass Index (BMI) 40.0 and over, adult: Secondary | ICD-10-CM | POA: Insufficient documentation

## 2014-02-05 DIAGNOSIS — Z713 Dietary counseling and surveillance: Secondary | ICD-10-CM | POA: Diagnosis not present

## 2014-02-05 NOTE — Patient Instructions (Signed)
-  Continue keeping healthy, high-protein snacks on hand   -Fruit, vegetables, almonds, boiled eggs, yogurt
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
## 2014-02-25 ENCOUNTER — Ambulatory Visit (INDEPENDENT_AMBULATORY_CARE_PROVIDER_SITE_OTHER): Payer: 59 | Admitting: Family Medicine

## 2014-02-25 ENCOUNTER — Encounter: Payer: Self-pay | Admitting: Family Medicine

## 2014-02-25 VITALS — BP 110/80 | HR 82 | Temp 97.6°F | Resp 16 | Ht 64.75 in | Wt 256.0 lb

## 2014-02-25 DIAGNOSIS — E119 Type 2 diabetes mellitus without complications: Secondary | ICD-10-CM

## 2014-02-25 DIAGNOSIS — R1032 Left lower quadrant pain: Secondary | ICD-10-CM

## 2014-02-25 DIAGNOSIS — E1169 Type 2 diabetes mellitus with other specified complication: Secondary | ICD-10-CM

## 2014-02-25 DIAGNOSIS — E669 Obesity, unspecified: Secondary | ICD-10-CM

## 2014-02-25 LAB — COMPREHENSIVE METABOLIC PANEL
ALT: 43 U/L — ABNORMAL HIGH (ref 0–35)
AST: 23 U/L (ref 0–37)
Albumin: 4.4 g/dL (ref 3.5–5.2)
Alkaline Phosphatase: 49 U/L (ref 39–117)
BUN: 12 mg/dL (ref 6–23)
CALCIUM: 9.4 mg/dL (ref 8.4–10.5)
CHLORIDE: 105 meq/L (ref 96–112)
CO2: 23 meq/L (ref 19–32)
CREATININE: 0.67 mg/dL (ref 0.50–1.10)
Glucose, Bld: 102 mg/dL — ABNORMAL HIGH (ref 70–99)
Potassium: 4.8 mEq/L (ref 3.5–5.3)
Sodium: 138 mEq/L (ref 135–145)
TOTAL PROTEIN: 7.1 g/dL (ref 6.0–8.3)
Total Bilirubin: 0.4 mg/dL (ref 0.2–1.2)

## 2014-02-25 LAB — CBC
HEMATOCRIT: 41 % (ref 36.0–46.0)
Hemoglobin: 13.7 g/dL (ref 12.0–15.0)
MCH: 28.4 pg (ref 26.0–34.0)
MCHC: 33.4 g/dL (ref 30.0–36.0)
MCV: 84.9 fL (ref 78.0–100.0)
MPV: 10.2 fL (ref 9.4–12.4)
Platelets: 296 10*3/uL (ref 150–400)
RBC: 4.83 MIL/uL (ref 3.87–5.11)
RDW: 14.4 % (ref 11.5–15.5)
WBC: 8.1 10*3/uL (ref 4.0–10.5)

## 2014-02-25 LAB — POCT GLYCOSYLATED HEMOGLOBIN (HGB A1C): Hemoglobin A1C: 5.8
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
## 2014-02-25 NOTE — Patient Instructions (Signed)
Your A1c is 5.8% today- this is great!  Keep up the good work with weight loss- you should be proud!   Please come and see me in 3-4 months.

## 2014-03-20 ENCOUNTER — Ambulatory Visit (INDEPENDENT_AMBULATORY_CARE_PROVIDER_SITE_OTHER): Payer: 59 | Admitting: Physician Assistant

## 2014-03-20 VITALS — BP 122/70 | HR 66 | Temp 98.1°F | Resp 16 | Ht 64.25 in | Wt 251.8 lb

## 2014-03-20 DIAGNOSIS — M545 Low back pain, unspecified: Secondary | ICD-10-CM

## 2014-03-20 MED ORDER — CYCLOBENZAPRINE HCL 10 MG PO TABS: 10.0000 mg | ORAL_TABLET | Freq: Three times a day (TID) | ORAL | Status: DC | PRN

## 2014-03-20 MED ORDER — HYDROCODONE-ACETAMINOPHEN 5-325 MG PO TABS: 1.0000 | ORAL_TABLET | Freq: Four times a day (QID) | ORAL | Status: DC | PRN

## 2014-03-20 NOTE — Patient Instructions (Signed)
Use a heating pad to the area for 20 minutes 2-4 times each day.

## 2014-03-20 NOTE — Progress Notes (Signed)
Subjective:    Patient ID: Woodroe ModeKaren Henriquez, female    DOB: 11/22/1980, 33 y.o.   MRN: [id number]   PCP: Abbe AmsterdamOPLAND,JESSICA, MD  Chief Complaint  Patient presents with  . Back Pain    x 10 days     Allergies  Allergen Reactions  . Latex Itching and Cough  . Aspartame And Phenylalanine Other (See Comments)    Inflamed esophagus    Patient Active Problem List   Diagnosis Date Noted  . Type II or unspecified type diabetes mellitus without mention of complication, not stated as uncontrolled 12/25/2013  . Migraine   . Morbid obesity with BMI of 45.0-49.9, adult 05/20/2012  . Depression with anxiety 12/31/2011  . PCOS (polycystic ovarian syndrome) 08/11/2011  . Diverticulitis of sigmoid colon 10/09/2010    Prior to Admission medications   Medication Sig Start Date End Date Taking? Authorizing Provider  b complex vitamins tablet Take 1 tablet by mouth daily.   Yes Historical Provider, MD  buPROPion (WELLBUTRIN SR) 150 MG 12 hr tablet TAKE 2 TABLETS DAILY 01/29/14  Yes Gwenlyn FoundJessica C Copland, MD  cetirizine (ZYRTEC) 10 MG tablet Take 10 mg by mouth daily.   Yes Historical Provider, MD  Homeopathic Products (AZO YEAST PLUS) TABS Take 1-2 tablets by mouth 2 (two) times daily. Take 2 tablets every morning and 1 tablet every night until finished antiobiotic   Yes Historical Provider, MD  ibuprofen (ADVIL,MOTRIN) 200 MG tablet Take 800 mg by mouth 3 (three) times daily as needed (pain/headache).   Yes Historical Provider, MD  ketoprofen (ORUDIS) 50 MG capsule Take 50 mg by mouth 2 (two) times daily as needed (migraine headache).   Yes Historical Provider, MD  metFORMIN (GLUCOPHAGE) 500 MG tablet TAKE 1 TABLET TWICE A DAY WITH MEALS 01/29/14  Yes Gwenlyn FoundJessica C Copland, MD  Multiple Vitamin (MULTIVITAMIN WITH MINERALS) TABS tablet Take 1 tablet by mouth daily.   Yes Historical Provider, MD  naproxen sodium (ALEVE) 220 MG tablet Take 440 mg by mouth 2 (two) times daily as needed (headache/pain).   Yes  Historical Provider, MD  ranitidine (ZANTAC) 150 MG tablet Take 150 mg by mouth 2 (two) times daily as needed for heartburn.   Yes Historical Provider, MD  traZODone (DESYREL) 50 MG tablet Take 50 mg by mouth at bedtime as needed for sleep.   Yes Historical Provider, MD    Medical, Surgical, Family and Social History reviewed and updated.  HPI  Awoke 10 days ago with back pain. No recalled trauma or injury, "but it's possible I tripped over a cat." Initially thought she may have "slept funny." Saw a chiropractor on day 3, but received no relief (previous back pain has gotten substantially better with adjustments). Sometimes "I can wiggle around and be fine, but then if I move it comes back." No dysuria. No loss of bowel or bladder control. No saddle anesthesia. No radicular pain. No paresthesias. "I can feel it in my back (at a different place) when I need to pee, but it doesn't hurt when I pee, and it feels better after I pee."  Ibuprofen, Aleve and AZO Cranberry without benefit.  Started [employer] weight loss program in 01/2014. Has lost 30 lbs in the past 12 months, 20 of them since starting [employer] program. Program recommends drinking half your body weight in ounces of water.  She's currently drinking 120 ounces of water daily. She feels good.  Review of Systems As above. Otherwise, negative.    Objective:   Physical  Exam  Constitutional: She is oriented to person, place, and time. She appears well-developed and well-nourished. She is active and cooperative. No distress.  BP 122/70 mmHg  Pulse 66  Temp(Src) 98.1 F (36.7 C) (Oral)  Resp 16  Ht 5' 4.25" (1.632 m)  Wt 251 lb 12.8 oz (114.216 kg)  BMI 42.88 kg/m2  SpO2 100%  LMP 03/04/2014   Eyes: Conjunctivae are normal. No scleral icterus.  Neck: Neck supple. No thyromegaly present.  Cardiovascular: Normal rate, regular rhythm and normal heart sounds.   Pulmonary/Chest: Effort normal.  Abdominal: There is no CVA  tenderness.  Musculoskeletal:       Lumbar back: She exhibits decreased range of motion, tenderness and bony tenderness. She exhibits no swelling and no edema.       Back:  Lymphadenopathy:    She has no cervical adenopathy.  Neurological: She is alert and oriented to person, place, and time. She has normal strength. No sensory deficit.  Psychiatric: She has a normal mood and affect. Her speech is normal and behavior is normal.          Assessment & Plan:  1. Midline low back pain without sciatica Counseled on appropriate use of NSAIDS (She can take EITHER ibuprofen OR naproxen, but not both). Hydrocodone and cyclobenzaprine. If symptoms worsen or persist, RTC for radiographs and consider PT.  She reports that when she's needed treatment in the past this has worked and she's doubtful that she'll need to return for additional evaluation. - HYDROcodone-acetaminophen (NORCO) 5-325 MG per tablet; Take 1 tablet by mouth every 6 (six) hours as needed.  Dispense: 30 tablet; Refill: 0 - cyclobenzaprine (FLEXERIL) 10 MG tablet; Take 1 tablet (10 mg total) by mouth 3 (three) times daily as needed for muscle spasms.  Dispense: 30 tablet; Refill: 0 - POCT UA - Microscopic Only - POCT urinalysis dipstick  It appears that the ordered UA dip and micro were not performed. If she develops dysuria, she will return. I recommend that she reduce the water to 100 ounces. Last sodium was normal.  Fernande Brashelle S. Chauncey Sciulli, PA-C Physician Assistant-Certified [facility]

## 2014-03-21 ENCOUNTER — Telehealth: Payer: Self-pay | Admitting: *Deleted

## 2014-03-21 NOTE — Telephone Encounter (Signed)
-----   Message from Fernande Brashelle S Jeffery, [place] sent at 03/20/2014  6:59 PM EST ----- Regarding: UA ordered, not done I ordered a UA with micro on this patient, but she didn't want to wait for the results.  I recall putting the lab light on, and leaving my flags out, but there are no notes in.  Was the lab performed?

## 2014-03-28 LAB — POCT URINALYSIS DIPSTICK
Bilirubin, UA: NEGATIVE
GLUCOSE UA: NEGATIVE
Ketones, UA: NEGATIVE
LEUKOCYTES UA: NEGATIVE
NITRITE UA: NEGATIVE
Protein, UA: NEGATIVE
RBC UA: NEGATIVE
Spec Grav, UA: 1.01
UROBILINOGEN UA: 0.2
pH, UA: 5.5

## 2014-03-28 LAB — POCT UA - MICROSCOPIC ONLY
Bacteria, U Microscopic: NEGATIVE
Casts, Ur, LPF, POC: NEGATIVE
Crystals, Ur, HPF, POC: NEGATIVE
Mucus, UA: NEGATIVE
RBC, urine, microscopic: NEGATIVE
WBC, UR, HPF, POC: NEGATIVE
YEAST UA: NEGATIVE

## 2014-03-28 NOTE — Telephone Encounter (Signed)
This was done but not put in.  Sorry about that.  Results put in.

## 2014-04-07 ENCOUNTER — Telehealth: Payer: Self-pay

## 2014-04-07 NOTE — Telephone Encounter (Signed)
Left message to RTC for re-evaluation.

## 2014-04-07 NOTE — Telephone Encounter (Signed)
Pt called in and wanted to let Chelle know that she in still having pain.  She also tripped again. She would like some advise on what to do next.She can be reached @ [phone]. Thank you

## 2014-04-10 ENCOUNTER — Ambulatory Visit (INDEPENDENT_AMBULATORY_CARE_PROVIDER_SITE_OTHER): Payer: 59

## 2014-04-10 ENCOUNTER — Ambulatory Visit (INDEPENDENT_AMBULATORY_CARE_PROVIDER_SITE_OTHER): Payer: 59 | Admitting: Family Medicine

## 2014-04-10 VITALS — BP 122/72 | HR 106 | Temp 98.4°F | Resp 18 | Ht 64.25 in | Wt 254.4 lb

## 2014-04-10 DIAGNOSIS — M5442 Lumbago with sciatica, left side: Secondary | ICD-10-CM

## 2014-04-10 MED ORDER — MELOXICAM 15 MG PO TABS: 15.0000 mg | ORAL_TABLET | Freq: Every day | ORAL | Status: DC

## 2014-04-10 NOTE — Patient Instructions (Signed)
Keep up with your walking.

## 2014-04-10 NOTE — Progress Notes (Signed)
X-rays were reviewed. Spine appears normal. Discussed with Margaretha GlassingJeffery PA-C and agree with treatment.

## 2014-04-10 NOTE — Progress Notes (Signed)
Subjective:    Patient ID: Kerri Ford, female    DOB: 06/21/1980, 33 y.o.   MRN: [id number]   PCP: Abbe AmsterdamOPLAND,JESSICA, MD  Chief Complaint  Patient presents with  . Back Pain    over 30 days--lower back pain--sharp pain with movements    Allergies  Allergen Reactions  . Latex Itching and Cough  . Aspartame And Phenylalanine Other (See Comments)    Inflamed esophagus    Patient Active Problem List   Diagnosis Date Noted  . Type II or unspecified type diabetes mellitus without mention of complication, not stated as uncontrolled 12/25/2013  . Migraine   . Morbid obesity with BMI of 45.0-49.9, adult 05/20/2012  . Depression with anxiety 12/31/2011  . PCOS (polycystic ovarian syndrome) 08/11/2011  . Diverticulitis of sigmoid colon 10/09/2010    Prior to Admission medications   Medication Sig Start Date End Date Taking? Authorizing Provider  b complex vitamins tablet Take 1 tablet by mouth daily.   Yes Historical Provider, MD  buPROPion (WELLBUTRIN SR) 150 MG 12 hr tablet TAKE 2 TABLETS DAILY 01/29/14  Yes Gwenlyn FoundJessica C Copland, MD  cetirizine (ZYRTEC) 10 MG tablet Take 10 mg by mouth daily.   Yes Historical Provider, MD  cyclobenzaprine (FLEXERIL) 10 MG tablet Take 1 tablet (10 mg total) by mouth 3 (three) times daily as needed for muscle spasms. 03/20/14  Yes Tysen Roesler Tessa LernerS Andris Brothers, PA-C  Homeopathic Products (AZO YEAST PLUS) TABS Take 1-2 tablets by mouth 2 (two) times daily. Take 2 tablets every morning and 1 tablet every night until finished antiobiotic   Yes Historical Provider, MD  HYDROcodone-acetaminophen (NORCO) 5-325 MG per tablet Take 1 tablet by mouth every 6 (six) hours as needed. 03/20/14  Yes Bryton Romagnoli S Caylan Chenard, PA-C  ibuprofen (ADVIL,MOTRIN) 200 MG tablet Take 800 mg by mouth 3 (three) times daily as needed (pain/headache).   Yes Historical Provider, MD  ketoprofen (ORUDIS) 50 MG capsule Take 50 mg by mouth 2 (two) times daily as needed (migraine headache).   Yes Historical  Provider, MD  metFORMIN (GLUCOPHAGE) 500 MG tablet TAKE 1 TABLET TWICE A DAY WITH MEALS 01/29/14  Yes Gwenlyn FoundJessica C Copland, MD  Multiple Vitamin (MULTIVITAMIN WITH MINERALS) TABS tablet Take 1 tablet by mouth daily.   Yes Historical Provider, MD  naproxen sodium (ALEVE) 220 MG tablet Take 440 mg by mouth 2 (two) times daily as needed (headache/pain).   Yes Historical Provider, MD  ranitidine (ZANTAC) 150 MG tablet Take 150 mg by mouth 2 (two) times daily as needed for heartburn.   Yes Historical Provider, MD  traZODone (DESYREL) 50 MG tablet Take 50 mg by mouth at bedtime as needed for sleep.   Yes Historical Provider, MD    Medical, Surgical, Family and Social History reviewed and updated.  HPI  Presents for evaluation of persistent back pain. "Today is day 30." Has become reluctant to exercise, and therefore gained a few pounds, over fear that her pain will increase. Some positions she has no pain, some a little bit of pain, and some movements cause severe pain associated with feeling nauseated and faint. Pain can extend down the legs, more often to the LEFT. Daily BMs. Normal urination. No saddle anesthesia. No LE weakness. Has seen the chiropractor without benefit. Has had similar episodes in the past, but it always resolved.  Evaluated for same about 3 weeks ago. UA then was normal. Muscle relaxer and hydrocodone make her sleepy, but don't seem to help with the pain. Took some  OTC ibuprofen yesterday with some relief.  Stretching the back hurts, and she notes that flexion of the neck "pulls" in the low back.   Review of Systems As above.    Objective:   Physical Exam  Constitutional: She is oriented to person, place, and time. She appears well-developed and well-nourished. She is active and cooperative. No distress.  BP 122/72 mmHg  Pulse 106  Temp(Src) 98.4 F (36.9 C) (Oral)  Resp 18  Ht 5' 4.25" (1.632 m)  Wt 254 lb 6.4 oz (115.395 kg)  BMI 43.33 kg/m2  SpO2 97%   LMP 03/28/2014   Eyes: Conjunctivae are normal.  Pulmonary/Chest: Effort normal.  Musculoskeletal:       Thoracic back: Normal.       Lumbar back: Normal.  Unable to reproduce symptoms on exam.  Neurological: She is alert and oriented to person, place, and time. She has normal strength. No sensory deficit.  SLR positive on LEFT with pain into the buttock on the LEFT  Psychiatric: She has a normal mood and affect. Her speech is normal and behavior is normal.   LSSPINE: [facility] reading (PRIMARY) by  Dr. Alwyn RenHopper. Normal LS Spine. No bony deformity. Disc spaces are normal.         Assessment & Plan:  1. Midline low back pain with left-sided sciatica She's relieved to know that the films are normal, as a chiropractor has previously told her she had "Lots of problems at the base of my spine." Encouraged continued walking for exercise. PT to eval and treat-anticipate core stretching and strengthening. - DG Lumbar Spine Complete; Future - meloxicam (MOBIC) 15 MG tablet; Take 1 tablet (15 mg total) by mouth daily.  Dispense: 30 tablet; Refill: 1 - Ambulatory referral to Physical Therapy   Fernande Brashelle S. Meredeth Furber, PA-C Physician Assistant-Certified [facility]

## 2014-04-18 ENCOUNTER — Ambulatory Visit: Payer: 59 | Attending: Physician Assistant | Admitting: Physical Therapy

## 2014-04-18 DIAGNOSIS — M5432 Sciatica, left side: Secondary | ICD-10-CM | POA: Insufficient documentation
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
## 2014-04-18 NOTE — Patient Instructions (Signed)
Double Knee to Chest (Flexion)   Gently pull both knees toward chest. Feel stretch in lower back or buttock area. Breathing deeply, Hold ___30_ seconds. Repeat __3__ times. Do _2___ sessions per day.  http://gt2.exer.us/227   Copyright  VHI. All rights reserved.  Piriformis (Supine)   Cross legs, right on top. Gently pull other knee toward chest until stretch is felt in buttock/hip of top leg. Hold _30___ seconds. Repeat ___3_ times per set.  Do __2__ sessions per day.  http://orth.exer.us/676   Copyright  VHI. All rights reserved.  Lower Trunk Rotation Stretch   Keeping back flat and feet together, rotate knees to left side. Hold ___30_ seconds. Repeat __3_ times per set. . Do __2__ sessions per day.  http://orth.exer.us/122   Copyright  VHI. All rights reserved.   Pelvic Tilt   Flatten back by tightening stomach muscles and buttocks. Repeat __10__ times per set. Do _1___ sets per session. Do __2__ sessions per day.  http://orth.exer.us/135   Copyright  VHI. All rights reserved.   Clarita CraneStephanie F Abbegale Stehle, PT, DPT 04/18/2014 8:30 AM  [facility] [address] [place], [place] [address]  [phone] (office) [phone] (fax)

## 2014-04-19 ENCOUNTER — Telehealth: Payer: Self-pay | Admitting: *Deleted

## 2014-04-19 ENCOUNTER — Ambulatory Visit: Payer: 59

## 2014-04-19 NOTE — Telephone Encounter (Signed)
Pt would like for you to give her a call . She has question about her exercise....thanks

## 2014-04-25 ENCOUNTER — Telehealth: Payer: Self-pay

## 2014-04-25 ENCOUNTER — Ambulatory Visit (INDEPENDENT_AMBULATORY_CARE_PROVIDER_SITE_OTHER): Payer: 59 | Admitting: Family Medicine

## 2014-04-25 VITALS — BP 120/80 | HR 99 | Temp 98.2°F | Resp 18 | Ht 64.0 in | Wt 251.4 lb

## 2014-04-25 DIAGNOSIS — M5416 Radiculopathy, lumbar region: Secondary | ICD-10-CM

## 2014-04-25 DIAGNOSIS — M5116 Intervertebral disc disorders with radiculopathy, lumbar region: Secondary | ICD-10-CM

## 2014-04-25 DIAGNOSIS — M48061 Spinal stenosis, lumbar region without neurogenic claudication: Secondary | ICD-10-CM

## 2014-04-25 DIAGNOSIS — M545 Low back pain: Secondary | ICD-10-CM

## 2014-04-25 DIAGNOSIS — M4806 Spinal stenosis, lumbar region: Secondary | ICD-10-CM

## 2014-04-25 DIAGNOSIS — M533 Sacrococcygeal disorders, not elsewhere classified: Secondary | ICD-10-CM

## 2014-04-25 LAB — COMPREHENSIVE METABOLIC PANEL
ALK PHOS: 61 U/L (ref 39–117)
ALT: 53 U/L — ABNORMAL HIGH (ref 0–35)
AST: 25 U/L (ref 0–37)
Albumin: 4.2 g/dL (ref 3.5–5.2)
BUN: 17 mg/dL (ref 6–23)
CALCIUM: 9.4 mg/dL (ref 8.4–10.5)
CHLORIDE: 106 meq/L (ref 96–112)
CO2: 24 meq/L (ref 19–32)
Creat: 0.67 mg/dL (ref 0.50–1.10)
Glucose, Bld: 92 mg/dL (ref 70–99)
Potassium: 4.8 mEq/L (ref 3.5–5.3)
SODIUM: 139 meq/L (ref 135–145)
Total Bilirubin: 0.3 mg/dL (ref 0.2–1.2)
Total Protein: 7.4 g/dL (ref 6.0–8.3)

## 2014-04-25 LAB — POCT SEDIMENTATION RATE: POCT SED RATE: 38 mm/h — AB (ref 0–22)

## 2014-04-25 LAB — POCT CBC
Granulocyte percent: 75.2 %G (ref 37–80)
HEMATOCRIT: 43.9 % (ref 37.7–47.9)
HEMOGLOBIN: 13.9 g/dL (ref 12.2–16.2)
LYMPH, POC: 2.2 (ref 0.6–3.4)
MCH, POC: 27.8 pg (ref 27–31.2)
MCHC: 31.8 g/dL (ref 31.8–35.4)
MCV: 87.4 fL (ref 80–97)
MID (cbc): 0.6 (ref 0–0.9)
MPV: 7.4 fL (ref 0–99.8)
POC GRANULOCYTE: 8.7 — AB (ref 2–6.9)
POC LYMPH %: 19.3 % (ref 10–50)
POC MID %: 5.5 %M (ref 0–12)
Platelet Count, POC: 354 10*3/uL (ref 142–424)
RBC: 5.01 M/uL (ref 4.04–5.48)
RDW, POC: 14.6 %
WBC: 11.6 10*3/uL — AB (ref 4.6–10.2)

## 2014-04-25 LAB — C-REACTIVE PROTEIN: CRP: 0.7 mg/dL — AB (ref <0.60)

## 2014-04-25 LAB — CK: CK TOTAL: 102 U/L (ref 7–177)

## 2014-04-25 LAB — GLUCOSE, POCT (MANUAL RESULT ENTRY): POC Glucose: 108 mg/dl — AB (ref 70–99)

## 2014-04-25 MED ORDER — LIDOCAINE 5 % EX PTCH: 1.0000 | MEDICATED_PATCH | CUTANEOUS | Status: DC

## 2014-04-25 MED ORDER — METHYLPREDNISOLONE SODIUM SUCC 125 MG IJ SOLR
125.0000 mg | Freq: Once | INTRAMUSCULAR | Status: AC
  Administered 2014-04-25: 125 mg via INTRAMUSCULAR

## 2014-04-25 MED ORDER — KETOROLAC TROMETHAMINE 60 MG/2ML IM SOLN
60.0000 mg | Freq: Once | INTRAMUSCULAR | Status: AC
  Administered 2014-04-25: 60 mg via INTRAMUSCULAR

## 2014-04-25 MED ORDER — DICLOFENAC SODIUM 1 % TD GEL: 4.0000 g | Freq: Four times a day (QID) | TRANSDERMAL | Status: DC

## 2014-04-25 MED ORDER — DIAZEPAM 10 MG PO TABS: 10.0000 mg | ORAL_TABLET | Freq: Two times a day (BID) | ORAL | Status: DC | PRN

## 2014-04-25 NOTE — Patient Instructions (Signed)
Start taking the prednisone every morning and 1/2 tab of the valium at night.  Then try either the topical diclofenac or the topical lidoderm for relief (lidoderm may work better but may need us to complete a prior authorization.)  Let us know.  If you are starting to get better, lets try to get your MRI scheduled for a morning late next week.  Lets talk with your physical therapist about whether they can try to mobilize your SI joint so we can try to figure where this is coming from.  Sacroiliac Joint Dysfunction The sacroiliac joint connects the lower part of the spine (the sacrum) with the bones of the pelvis. CAUSES  Sometimes, there is no obvious reason for sacroiliac joint dysfunction. Other times, it may occur   During pregnancy.  After injury, such as:  Car accidents.  Sport-related injuries.  Work-related injuries.  Due to one leg being shorter than the other.  Due to other conditions that affect the joints, such as:  Rheumatoid arthritis.  Gout.  Psoriasis.  Joint infection (septic arthritis). SYMPTOMS  Symptoms may include:  Pain in the:  Lower back.  Buttocks.  Groin.  Thighs and legs.  Difficult sitting, standing, walking, lying, bending or lifting. DIAGNOSIS  A number of tests may be used to help diagnose the cause of sacroiliac joint dysfunction, including:  Imaging tests to look for other causes of pain, including:  MRI.  CT scan.  Bone scan.  Diagnostic injection: During a special x-ray (called fluoroscopy), a needle is put into the sacroiliac joint. A numbing medicine is injected into the joint. If the pain is improved or stopped, the diagnosis of sacroiliac joint dysfunction is more likely. TREATMENT  There are a number of types of treatment used for sacroiliac joint dysfunction, including:  Only take over-the-counter or prescription medicines for pain, discomfort, or fever as directed by your caregiver.  Medications to relax  muscles.  Rest. Decreasing activity can help cut down on painful muscle spasms and allow the back to heal.  Application of heat or ice to the lower back may improve muscle spasms and soothe pain.  Brace. A special back brace, called a sacroiliac belt, can help support the joint while your back is healing.  Physical therapy can help teach comfortable positions and exercises to strengthen muscles that support the sacroiliac joint.  Cortisone injections. Injections of steroid medicine into the joint can help decrease swelling and improve pain.  Hyaluronic acid injections. This chemical improves lubrication within the sacroiliac joint, thereby decreasing pain.  Radiofrequency ablation. A special needle is placed into the joint, where it burns away nerves that are carrying pain messages from the joint.  Surgery. Because pain occurs during movement of the joint, screws and plates may be installed in order to limit or prevent joint motion. HOME CARE INSTRUCTIONS   Take all medications exactly as directed.  Follow instructions regarding both rest and physical activity, to avoid worsening the pain.  Do physical therapy exercises exactly as prescribed. SEEK IMMEDIATE MEDICAL CARE IF:  You experience increasingly severe pain.  You develop new symptoms, such as numbness or tingling in your legs or feet.  You lose bladder or bowel control. Document Released: 06/25/2008 Document Revised: 06/21/2011 Document Reviewed: 06/25/2008 [facility] Patient Information 2015 [employer] [place]. This information is not intended to replace advice given to you by your health care provider. Make sure you discuss any questions you have with your health care provider.

## 2014-04-25 NOTE — Telephone Encounter (Signed)
Patient was seen today was giving 2 prescriptions "Voltaren", "Lidoderm" and a prednisone injection. Her insurance denied the Voltaren and Lidoderm. Patient thought she was to get "Prednisone pills" also but not sure. Patient is requesting if she was suppose to have [facility] on [address] Patients call back number is [phone]
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
## 2014-04-25 NOTE — Telephone Encounter (Signed)
Pt is at the pharmacy wanted to let us know that voltaren and lidoderm has been denied thru insurance and predizone has not yet been received at the pharmacy

## 2014-04-25 NOTE — Telephone Encounter (Signed)
Oops - sorry - she was supposed to start prednisone tomorrow morning. Sent to pharm.  Can we get PA for lidoderm patches? thanks

## 2014-04-25 NOTE — Telephone Encounter (Signed)
Pt states that the pharmacy does not have the prednisone was not at the pharmacy. Please advise if this prescription was to be sent- Pt AVS does state that she is to start taking in the morning. Pt is wanting a return call in the morning- she is going home to sleep and does not want to be disturbed!

## 2014-04-25 NOTE — Telephone Encounter (Signed)
Please see additional phone message in regards to prednisone.

## 2014-04-26 MED ORDER — PREDNISONE 20 MG PO TABS: ORAL_TABLET | ORAL | Status: DC

## 2014-04-26 NOTE — Telephone Encounter (Signed)
Patient is requesting a note from Dr. Clelia CroftShaw and the PT also told her to cancel her chiropractors ([facility] - fax [phone]) Patient also wants a copy of the order from Dr. Clelia CroftShaw for her records.   Chiropractor insists patient pays for an additional month of payments.   [phone]

## 2014-04-26 NOTE — Telephone Encounter (Signed)
Prednisone sent to pharm - can we get a PA started for either lidoderm or voltaren?  I would prefer lidoderm - think it would provide more relief to pt - she has failed hydrocodone, oxycodone, flexeril, naproxen, ibuprofen, ketoprofen, meloxicam, prednisone, valium, and toradol. .  .   thanks

## 2014-04-28 NOTE — Telephone Encounter (Signed)
That is up to her but I would encourage her to keep her PT appt - we do not want her doing much but some at least they can put her on a TENS unit or try to mobilize her SI joints a little just to try to help her with her pain as her MRI is not scheduled yet. If she is having any weakness or numbness, agree with recheck in clinic immed.

## 2014-04-28 NOTE — Telephone Encounter (Signed)
Dr Clelia CroftShaw   Patient is scheduled for Tuesday morning.  She woke with pain in both feet this morning.  She is foregoing her PT until after the MRI, she will see you afterwards as well.   Taking Norco in addition to pain meds.   [phone]

## 2014-04-29 ENCOUNTER — Other Ambulatory Visit: Payer: Self-pay | Admitting: Family Medicine

## 2014-04-29 ENCOUNTER — Other Ambulatory Visit: Payer: Self-pay | Admitting: *Deleted

## 2014-04-29 ENCOUNTER — Ambulatory Visit: Payer: 59

## 2014-04-29 DIAGNOSIS — M5432 Sciatica, left side: Secondary | ICD-10-CM

## 2014-04-29 DIAGNOSIS — M545 Low back pain, unspecified: Secondary | ICD-10-CM

## 2014-04-29 MED ORDER — HYDROCODONE-ACETAMINOPHEN 5-325 MG PO TABS: 1.0000 | ORAL_TABLET | Freq: Four times a day (QID) | ORAL | Status: DC | PRN

## 2014-04-29 NOTE — Telephone Encounter (Signed)
PAs were done on 04/26/14 for both voltaren and lidocaine patches. Voltaren gel was approved through 04/26/15 and pharm notified. PA is still pending for Lidocaine.

## 2014-04-29 NOTE — Telephone Encounter (Signed)
Lm for pt to RTC- left Dr. Clelia CroftShaw office hours for today and tomorrow.

## 2014-04-29 NOTE — Telephone Encounter (Signed)
Pt called back and states that she did go to PT today. She has her MRI scheduled for tomorrow. She will follow up with the office after her scan.  She finally got the Voltaren Gel approved. She states it does not have much effect.  Pain continues to radiate down her leg. She needs a refill on the Norco medication. I have pended the medication please advise.

## 2014-04-29 NOTE — Telephone Encounter (Signed)
Not sure why this message was sent to medical records. There is no documentation stating the patient requests anything from medical records.
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
## 2014-04-30 ENCOUNTER — Ambulatory Visit
Admission: RE | Admit: 2014-04-30 | Discharge: 2014-04-30 | Disposition: A | Discharge status: Home / Self Care | Payer: 59 | Source: Ambulatory Visit | Attending: Family Medicine | Admitting: Family Medicine

## 2014-04-30 DIAGNOSIS — M545 Low back pain: Secondary | ICD-10-CM

## 2014-05-01 ENCOUNTER — Telehealth: Payer: Self-pay | Admitting: *Deleted

## 2014-05-01 NOTE — Telephone Encounter (Signed)
per pt the doctor informed her to stop PT due to she needs surgery....td

## 2014-05-01 NOTE — Telephone Encounter (Signed)
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

## 2014-05-01 NOTE — Telephone Encounter (Signed)
Pt p/up Rx. 

## 2014-05-06 ENCOUNTER — Telehealth: Payer: Self-pay | Admitting: Family Medicine

## 2014-05-06 ENCOUNTER — Encounter: Payer: Self-pay | Admitting: Physical Therapy

## 2014-05-06 ENCOUNTER — Other Ambulatory Visit: Payer: Self-pay

## 2014-05-06 NOTE — Telephone Encounter (Signed)
PA denied for lidocaine patches. voltaren gel was approved on 04/29/14. Pharm notified of denial.

## 2014-05-06 NOTE — Telephone Encounter (Signed)
After closer reading of denial, I saw that ins will sometimes cover lidocaine patches for Dx of LBP. On covermymeds form, I had listed LBP along with sciatica as Dxs. Called to appeal and (after 58 mins on the phone) was able to get the denial overturned and lidocaine patches are approved from 04/06/14 - 05/05/17. Notified pt and pharm.

## 2014-05-06 NOTE — Telephone Encounter (Signed)
DUPLICATE

## 2014-05-06 NOTE — Telephone Encounter (Signed)
Yay! thanks

## 2014-05-06 NOTE — Telephone Encounter (Signed)
Patient wants Dr. Clelia CroftShaw to know that [facility].   [phone]

## 2014-05-06 NOTE — Telephone Encounter (Signed)
AMAZING BARBARA -  GOOD JOB

## 2014-05-06 NOTE — Telephone Encounter (Signed)
FYI Dr Shaw. 

## 2014-05-07 ENCOUNTER — Encounter: Payer: Self-pay | Admitting: Physical Therapy

## 2014-05-07 ENCOUNTER — Encounter: Payer: Self-pay | Admitting: Family Medicine

## 2014-05-07 NOTE — Therapy (Signed)
[place], [place], [address] Phone: [phone]   Fax:  [phone]  Patient Details  Name: Kerri Ford MRN: [id number] Date of Birth: 05-Oct-1980 Referring Provider:  No ref. provider found  Encounter Date: 05/07/2014  PHYSICAL THERAPY DISCHARGE SUMMARY  Visits from Start of Care: 2  Current functional level related to goals / functional outcomes:  PT LONG TERM GOAL #1    Title  independent with HEP (05/16/14)    Time  4    Period  Weeks    Status  Not Met    PT LONG TERM GOAL #2    Title  report ability to sit and perform work duties without increase in pain x 3 days (05/16/14)    Time  4    Period  Weeks    Status  Not Met    PT LONG TERM GOAL #3    Title  verbalize understanding of posture/body mechanics to reduce risk of reinjury (05/16/14)    Time  4    Period  Weeks    Status  Not Met       Remaining deficits: See initial eval and recent MD notes; pt with sever herniated disc and referred to neurosurgery.   Education / Equipment: HEP  Plan: Patient agrees to discharge.  Patient goals were not met. Patient is being discharged due to a change in medical status.  ?????      Laureen Abrahams, PT, DPT 05/07/2014 3:12 PM  [facility] [address] [place], [place], [address] Phone: [phone]   Fax:  [phone]

## 2014-05-08 ENCOUNTER — Ambulatory Visit: Payer: 59 | Admitting: Dietician

## 2014-05-09 ENCOUNTER — Telehealth: Payer: Self-pay

## 2014-05-09 NOTE — Telephone Encounter (Signed)
FYI Dr Shaw. 

## 2014-05-09 NOTE — Telephone Encounter (Signed)
Dr Clelia CroftShaw  Patient is scheduled for surgery on Tuesday at 10 am.  She will be out of work for eight weeks.   She is heading to work to start [employer] process.   [facility]  [phone]

## 2014-05-10 NOTE — Telephone Encounter (Signed)
Thanks for letting me know. Will be thinking of her and happy to help w/ anything she needs during this time.

## 2014-06-03 ENCOUNTER — Ambulatory Visit: Payer: 59 | Admitting: Family Medicine

## 2014-07-04 ENCOUNTER — Telehealth: Payer: Self-pay

## 2014-07-04 NOTE — Telephone Encounter (Signed)
Kerri Ford - Pt has an upcoming appointment with you and it is for a full physical.  She wants to know if there is any way she should get a stronger anti-deppressant before then.  She has had surgery, is still out of work and is having a really hard time.  Says it is even hard for her to get out of bed something. She says she will come in if necessary, just thought she would call and ask first.  [phone]

## 2014-07-04 NOTE — Telephone Encounter (Signed)
SHAW - Pt has a CPE scheduled with you for April 8.  She wants to know if she could get a stronger anti-depressant called in before then.  Says she has had surgery, not back to work yet and is having a really hard time even getting out of bed.  She will come in if necessary, just thought she would call and ask first. [phone]

## 2014-07-04 NOTE — Telephone Encounter (Signed)
Please advise 

## 2014-07-05 NOTE — Telephone Encounter (Signed)
Advised to increase her wellbutrin sr to 300mg  qam and 150mg  qhs - call if ineffective and can switch to xl or back down to bid and add in low dose citalopram Pt agrees - has f/u sched for 2 wks.

## 2014-07-19 ENCOUNTER — Encounter: Payer: Self-pay | Admitting: Family Medicine

## 2014-07-19 ENCOUNTER — Telehealth: Payer: Self-pay

## 2014-07-19 ENCOUNTER — Ambulatory Visit (INDEPENDENT_AMBULATORY_CARE_PROVIDER_SITE_OTHER): Payer: 59 | Admitting: Family Medicine

## 2014-07-19 VITALS — BP 104/74 | HR 77 | Temp 98.6°F | Resp 16 | Ht 64.5 in | Wt 261.0 lb

## 2014-07-19 DIAGNOSIS — N898 Other specified noninflammatory disorders of vagina: Secondary | ICD-10-CM | POA: Diagnosis not present

## 2014-07-19 DIAGNOSIS — E282 Polycystic ovarian syndrome: Secondary | ICD-10-CM | POA: Diagnosis not present

## 2014-07-19 DIAGNOSIS — A499 Bacterial infection, unspecified: Secondary | ICD-10-CM | POA: Diagnosis not present

## 2014-07-19 DIAGNOSIS — F39 Unspecified mood [affective] disorder: Secondary | ICD-10-CM | POA: Diagnosis not present

## 2014-07-19 DIAGNOSIS — E559 Vitamin D deficiency, unspecified: Secondary | ICD-10-CM

## 2014-07-19 DIAGNOSIS — F4310 Post-traumatic stress disorder, unspecified: Secondary | ICD-10-CM

## 2014-07-19 DIAGNOSIS — R7303 Prediabetes: Secondary | ICD-10-CM

## 2014-07-19 DIAGNOSIS — R5383 Other fatigue: Secondary | ICD-10-CM | POA: Diagnosis not present

## 2014-07-19 DIAGNOSIS — R7309 Other abnormal glucose: Secondary | ICD-10-CM

## 2014-07-19 DIAGNOSIS — N760 Acute vaginitis: Secondary | ICD-10-CM | POA: Diagnosis not present

## 2014-07-19 DIAGNOSIS — M5136 Other intervertebral disc degeneration, lumbar region: Secondary | ICD-10-CM

## 2014-07-19 DIAGNOSIS — Z0000 Encounter for general adult medical examination without abnormal findings: Secondary | ICD-10-CM

## 2014-07-19 DIAGNOSIS — E669 Obesity, unspecified: Secondary | ICD-10-CM

## 2014-07-19 DIAGNOSIS — B9689 Other specified bacterial agents as the cause of diseases classified elsewhere: Secondary | ICD-10-CM

## 2014-07-19 LAB — COMPLETE METABOLIC PANEL WITH GFR
ALK PHOS: 55 U/L (ref 39–117)
ALT: 32 U/L (ref 0–35)
AST: 19 U/L (ref 0–37)
Albumin: 4.2 g/dL (ref 3.5–5.2)
BUN: 17 mg/dL (ref 6–23)
CALCIUM: 9.2 mg/dL (ref 8.4–10.5)
CHLORIDE: 103 meq/L (ref 96–112)
CO2: 26 mEq/L (ref 19–32)
CREATININE: 0.71 mg/dL (ref 0.50–1.10)
GFR, Est African American: >89 mL/min
Glucose, Bld: 101 mg/dL — ABNORMAL HIGH (ref 70–99)
Potassium: 4.6 mEq/L (ref 3.5–5.3)
Sodium: 138 mEq/L (ref 135–145)
Total Bilirubin: 0.5 mg/dL (ref 0.2–1.2)
Total Protein: 7.2 g/dL (ref 6.0–8.3)

## 2014-07-19 LAB — POCT GLYCOSYLATED HEMOGLOBIN (HGB A1C): Hemoglobin A1C: 5.7

## 2014-07-19 LAB — LIPID PANEL
Cholesterol: 165 mg/dL (ref 0–200)
HDL: 44 mg/dL — AB (ref >=46)
LDL Cholesterol: 95 mg/dL (ref 0–99)
Total CHOL/HDL Ratio: 3.8 Ratio
Triglycerides: 128 mg/dL (ref <150)
VLDL: 26 mg/dL (ref 0–40)

## 2014-07-19 LAB — POCT WET PREP WITH KOH
KOH Prep POC: NEGATIVE
Trichomonas, UA: NEGATIVE
WBC WET PREP PER HPF POC: NEGATIVE
Yeast Wet Prep HPF POC: NEGATIVE

## 2014-07-19 LAB — TSH: TSH: 1.659 u[IU]/mL (ref 0.350–4.500)

## 2014-07-19 LAB — VITAMIN D 25 HYDROXY (VIT D DEFICIENCY, FRACTURES): Vit D, 25-Hydroxy: 25 ng/mL — ABNORMAL LOW (ref 30–100)

## 2014-07-19 MED ORDER — METRONIDAZOLE 500 MG PO TABS: 500.0000 mg | ORAL_TABLET | Freq: Two times a day (BID) | ORAL | Status: DC

## 2014-07-19 MED ORDER — FLUCONAZOLE 150 MG PO TABS: 150.0000 mg | ORAL_TABLET | Freq: Once | ORAL | Status: DC
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
## 2014-07-19 NOTE — Patient Instructions (Signed)
Bacterial Vaginosis Bacterial vaginosis is a vaginal infection that occurs when the normal balance of bacteria in the vagina is disrupted. It results from an overgrowth of certain bacteria. This is the most common vaginal infection in women of childbearing age. Treatment is important to prevent complications, especially in pregnant women, as it can cause a premature delivery. CAUSES  Bacterial vaginosis is caused by an increase in harmful bacteria that are normally present in smaller amounts in the vagina. Several different kinds of bacteria can cause bacterial vaginosis. However, the reason that the condition develops is not fully understood. RISK FACTORS Certain activities or behaviors can put you at an increased risk of developing bacterial vaginosis, including:  Having a new sex partner or multiple sex partners.  Douching.  Using an intrauterine device (IUD) for contraception. Women do not get bacterial vaginosis from toilet seats, bedding, swimming pools, or contact with objects around them. SIGNS AND SYMPTOMS  Some women with bacterial vaginosis have no signs or symptoms. Common symptoms include:  Grey vaginal discharge.  A fishlike odor with discharge, especially after sexual intercourse.  Itching or burning of the vagina and vulva.  Burning or pain with urination. DIAGNOSIS  Your health care provider will take a medical history and examine the vagina for signs of bacterial vaginosis. A sample of vaginal fluid may be taken. Your health care provider will look at this sample under a microscope to check for bacteria and abnormal cells. A vaginal pH test may also be done.  TREATMENT  Bacterial vaginosis may be treated with antibiotic medicines. These may be given in the form of a pill or a vaginal cream. A second round of antibiotics may be prescribed if the condition comes back after treatment.  HOME CARE INSTRUCTIONS   Only take over-the-counter or prescription medicines as  directed by your health care provider.  If antibiotic medicine was prescribed, take it as directed. Make sure you finish it even if you start to feel better.  Do not have sex until treatment is completed.  Tell all sexual partners that you have a vaginal infection. They should see their health care provider and be treated if they have problems, such as a mild rash or itching.  Practice safe sex by using condoms and only having one sex partner. SEEK MEDICAL CARE IF:   Your symptoms are not improving after 3 days of treatment.  You have increased discharge or pain.  You have a fever. MAKE SURE YOU:   Understand these instructions.  Will watch your condition.  Will get help right away if you are not doing well or get worse. FOR MORE INFORMATION  Centers for Disease Control and Prevention, Division of STD Prevention: [employer] American Sexual Health Association (ASHA): www.ashastd.org  Document Released: 03/29/2005 Document Revised: 01/17/2013 Document Reviewed: 11/08/2012 [facility] Patient Information 2015 [place], [place]. This information is not intended to replace advice given to you by your health care provider. Make sure you discuss any questions you have with your health care provider. Exercise to Stay Healthy Exercise helps you become and stay healthy. EXERCISE IDEAS AND TIPS Choose exercises that:  You enjoy.  Fit into your day. You do not need to exercise really hard to be healthy. You can do exercises at a slow or medium level and stay healthy. You can:  Stretch before and after working out.  Try yoga, Pilates, or tai chi.  Lift weights.  Walk fast, swim, jog, run, climb stairs, bicycle, dance, or rollerskate.  Take aerobic classes.  Exercises that burn about 150 calories:  Running 1  miles in 15 minutes.  Playing volleyball for 45 to 60 minutes.  Washing and waxing a car for 45 to 60 minutes.  Playing touch football for 45 minutes.  Walking 1   miles in 35 minutes.  Pushing a stroller 1  miles in 30 minutes.  Playing basketball for 30 minutes.  Raking leaves for 30 minutes.  Bicycling 5 miles in 30 minutes.  Walking 2 miles in 30 minutes.  Dancing for 30 minutes.  Shoveling snow for 15 minutes.  Swimming laps for 20 minutes.  Walking up stairs for 15 minutes.  Bicycling 4 miles in 15 minutes.  Gardening for 30 to 45 minutes.  Jumping rope for 15 minutes.  Washing windows or floors for 45 to 60 minutes. Document Released: 05/01/2010 Document Revised: 06/21/2011 Document Reviewed: 05/01/2010 [facility] Patient Information 2015 [employer], [place]. This information is not intended to replace advice given to you by your health care provider. Make sure you discuss any questions you have with your health care provider.  Health Maintenance Adopting a healthy lifestyle and getting preventive care can go a long way to promote health and wellness. Talk with your health care provider about what schedule of regular examinations is right for you. This is a good chance for you to check in with your provider about disease prevention and staying healthy. In between checkups, there are plenty of things you can do on your own. Experts have done a lot of research about which lifestyle changes and preventive measures are most likely to keep you healthy. Ask your health care provider for more information. WEIGHT AND DIET  Eat a healthy diet  Be sure to include plenty of vegetables, fruits, low-fat dairy products, and lean protein.  Do not eat a lot of foods high in solid fats, added sugars, or salt.  Get regular exercise. This is one of the most important things you can do for your health.  Most adults should exercise for at least 150 minutes each week. The exercise should increase your heart rate and make you sweat (moderate-intensity exercise).  Most adults should also do strengthening exercises at least twice a week. This is in  addition to the moderate-intensity exercise.  Maintain a healthy weight  Body mass index (BMI) is a measurement that can be used to identify possible weight problems. It estimates body fat based on height and weight. Your health care provider can help determine your BMI and help you achieve or maintain a healthy weight.  For females 23 years of age and older:   A BMI below 18.5 is considered underweight.  A BMI of 18.5 to 24.9 is normal.  A BMI of 25 to 29.9 is considered overweight.  A BMI of 30 and above is considered obese.  Watch levels of cholesterol and blood lipids  You should start having your blood tested for lipids and cholesterol at 34 years of age, then have this test every 5 years.  You may need to have your cholesterol levels checked more often if:  Your lipid or cholesterol levels are high.  You are older than 34 years of age.  You are at high risk for heart disease.  CANCER SCREENING   Lung Cancer  Lung cancer screening is recommended for adults 22-99 years old who are at high risk for lung cancer because of a history of smoking.  A yearly low-dose CT scan of the lungs is recommended for people who:  Currently smoke.  Have quit within the past 15 years.  Have at least a 30-pack-year history of smoking. A pack year is smoking an average of one pack of cigarettes a day for 1 year.  Yearly screening should continue until it has been 15 years since you quit.  Yearly screening should stop if you develop a health problem that would prevent you from having lung cancer treatment.  Breast Cancer  Practice breast self-awareness. This means understanding how your breasts normally appear and feel.  It also means doing regular breast self-exams. Let your health care provider know about any changes, no matter how small.  If you are in your 20s or 30s, you should have a clinical breast exam (CBE) by a health care provider every 1-3 years as part of a regular  health exam.  If you are 23 or older, have a CBE every year. Also consider having a breast X-ray (mammogram) every year.  If you have a family history of breast cancer, talk to your health care provider about genetic screening.  If you are at high risk for breast cancer, talk to your health care provider about having an MRI and a mammogram every year.  Breast cancer gene (BRCA) assessment is recommended for women who have family members with BRCA-related cancers. BRCA-related cancers include:  Breast.  Ovarian.  Tubal.  Peritoneal cancers.  Results of the assessment will determine the need for genetic counseling and BRCA1 and BRCA2 testing. Cervical Cancer Routine pelvic examinations to screen for cervical cancer are no longer recommended for nonpregnant women who are considered low risk for cancer of the pelvic organs (ovaries, uterus, and vagina) and who do not have symptoms. A pelvic examination may be necessary if you have symptoms including those associated with pelvic infections. Ask your health care provider if a screening pelvic exam is right for you.   The Pap test is the screening test for cervical cancer for women who are considered at risk.  If you had a hysterectomy for a problem that was not cancer or a condition that could lead to cancer, then you no longer need Pap tests.  If you are older than 65 years, and you have had normal Pap tests for the past 10 years, you no longer need to have Pap tests.  If you have had past treatment for cervical cancer or a condition that could lead to cancer, you need Pap tests and screening for cancer for at least 20 years after your treatment.  If you no longer get a Pap test, assess your risk factors if they change (such as having a new sexual partner). This can affect whether you should start being screened again.  Some women have medical problems that increase their chance of getting cervical cancer. If this is the case for you, your  health care provider may recommend more frequent screening and Pap tests.  The human papillomavirus (HPV) test is another test that may be used for cervical cancer screening. The HPV test looks for the virus that can cause cell changes in the cervix. The cells collected during the Pap test can be tested for HPV.  The HPV test can be used to screen women 10 years of age and older. Getting tested for HPV can extend the interval between normal Pap tests from three to five years.  An HPV test also should be used to screen women of any age who have unclear Pap test results.  After 34 years of age, women  should have HPV testing as often as Pap tests.  Colorectal Cancer  This type of cancer can be detected and often prevented.  Routine colorectal cancer screening usually begins at 34 years of age and continues through 34 years of age.  Your health care provider may recommend screening at an earlier age if you have risk factors for colon cancer.  Your health care provider may also recommend using home test kits to check for hidden blood in the stool.  A small camera at the end of a tube can be used to examine your colon directly (sigmoidoscopy or colonoscopy). This is done to check for the earliest forms of colorectal cancer.  Routine screening usually begins at age 25.  Direct examination of the colon should be repeated every 5-10 years through 34 years of age. However, you may need to be screened more often if early forms of precancerous polyps or small growths are found. Skin Cancer  Check your skin from head to toe regularly.  Tell your health care provider about any new moles or changes in moles, especially if there is a change in a mole's shape or color.  Also tell your health care provider if you have a mole that is larger than the size of a pencil eraser.  Always use sunscreen. Apply sunscreen liberally and repeatedly throughout the day.  Protect yourself by wearing long sleeves,  pants, a wide-brimmed hat, and sunglasses whenever you are outside. HEART DISEASE, DIABETES, AND HIGH BLOOD PRESSURE   Have your blood pressure checked at least every 1-2 years. High blood pressure causes heart disease and increases the risk of stroke.  If you are between 76 years and 4 years old, ask your health care provider if you should take aspirin to prevent strokes.  Have regular diabetes screenings. This involves taking a blood sample to check your fasting blood sugar level.  If you are at a normal weight and have a low risk for diabetes, have this test once every three years after 34 years of age.  If you are overweight and have a high risk for diabetes, consider being tested at a younger age or more often. PREVENTING INFECTION  Hepatitis B  If you have a higher risk for hepatitis B, you should be screened for this virus. You are considered at high risk for hepatitis B if:  You were born in a country where hepatitis B is common. Ask your health care provider which countries are considered high risk.  Your parents were born in a high-risk country, and you have not been immunized against hepatitis B (hepatitis B vaccine).  You have HIV or AIDS.  You use needles to inject street drugs.  You live with someone who has hepatitis B.  You have had sex with someone who has hepatitis B.  You get hemodialysis treatment.  You take certain medicines for conditions, including cancer, organ transplantation, and autoimmune conditions. Hepatitis C  Blood testing is recommended for:  Everyone born from [birth year] through 1965.  Anyone with known risk factors for hepatitis C. Sexually transmitted infections (STIs)  You should be screened for sexually transmitted infections (STIs) including gonorrhea and chlamydia if:  You are sexually active and are younger than 34 years of age.  You are older than 34 years of age and your health care provider tells you that you are at risk for this  type of infection.  Your sexual activity has changed since you were last screened and you are at an increased  risk for chlamydia or gonorrhea. Ask your health care provider if you are at risk.  If you do not have HIV, but are at risk, it may be recommended that you take a prescription medicine daily to prevent HIV infection. This is called pre-exposure prophylaxis (PrEP). You are considered at risk if:  You are sexually active and do not regularly use condoms or know the HIV status of your partner(s).  You take drugs by injection.  You are sexually active with a partner who has HIV. Talk with your health care provider about whether you are at high risk of being infected with HIV. If you choose to begin PrEP, you should first be tested for HIV. You should then be tested every 3 months for as long as you are taking PrEP.  PREGNANCY   If you are premenopausal and you may become pregnant, ask your health care provider about preconception counseling.  If you may become pregnant, take 400 to 800 micrograms (mcg) of folic acid every day.  If you want to prevent pregnancy, talk to your health care provider about birth control (contraception). OSTEOPOROSIS AND MENOPAUSE   Osteoporosis is a disease in which the bones lose minerals and strength with aging. This can result in serious bone fractures. Your risk for osteoporosis can be identified using a bone density scan.  If you are 60 years of age or older, or if you are at risk for osteoporosis and fractures, ask your health care provider if you should be screened.  Ask your health care provider whether you should take a calcium or vitamin D supplement to lower your risk for osteoporosis.  Menopause may have certain physical symptoms and risks.  Hormone replacement therapy may reduce some of these symptoms and risks. Talk to your health care provider about whether hormone replacement therapy is right for you.  HOME CARE INSTRUCTIONS   Schedule  regular health, dental, and eye exams.  Stay current with your immunizations.   Do not use any tobacco products including cigarettes, chewing tobacco, or electronic cigarettes.  If you are pregnant, do not drink alcohol.  If you are breastfeeding, limit how much and how often you drink alcohol.  Limit alcohol intake to no more than 1 drink per day for nonpregnant women. One drink equals 12 ounces of beer, 5 ounces of wine, or 1 ounces of hard liquor.  Do not use street drugs.  Do not share needles.  Ask your health care provider for help if you need support or information about quitting drugs.  Tell your health care provider if you often feel depressed.  Tell your health care provider if you have ever been abused or do not feel safe at home. Document Released: 10/12/2010 Document Revised: 08/13/2013 Document Reviewed: 02/28/2013 [facility] Patient Information 2015 [place], [place]. This information is not intended to replace advice given to you by your health care provider. Make sure you discuss any questions you have with your health care provider.

## 2014-07-19 NOTE — Telephone Encounter (Signed)
[facility] meds sent to:     [facility] on [address] fluconazole (DIFLUCAN) 150 MG tablet metroNIDAZOLE (FLAGYL) 500 MG tablet   Wellbutrin to [facility] increased to 400 mg from 300 mg.

## 2014-07-19 NOTE — Telephone Encounter (Signed)
Rx's re-sent. Left message letting her know.

## 2014-07-21 ENCOUNTER — Telehealth: Payer: Self-pay | Admitting: Family Medicine

## 2014-07-21 NOTE — Telephone Encounter (Signed)
Patient states that there is a problem with her Wellbutrin. States that the dosage should be higher (did not specify the quantity). [facility]   [phone]

## 2014-07-22 LAB — PAP IG AND HPV HIGH-RISK: HPV DNA High Risk: NOT DETECTED

## 2014-07-22 NOTE — Telephone Encounter (Signed)
Spoke with pt--she needs refill. She was on 450 mg and when she was here the other day, the rx she got was 300 mg. Please advise. Pt wants to make sure she is on right dosage.

## 2014-07-22 NOTE — Telephone Encounter (Signed)
Yes, pt is correct, will review and try to send in correct doses of all tonight - i will call pt to let he know when it is done.

## 2014-07-24 DIAGNOSIS — M5136 Other intervertebral disc degeneration, lumbar region: Secondary | ICD-10-CM | POA: Insufficient documentation

## 2014-07-24 DIAGNOSIS — F4310 Post-traumatic stress disorder, unspecified: Secondary | ICD-10-CM | POA: Insufficient documentation

## 2014-07-24 DIAGNOSIS — M51369 Other intervertebral disc degeneration, lumbar region without mention of lumbar back pain or lower extremity pain: Secondary | ICD-10-CM | POA: Insufficient documentation

## 2014-07-24 DIAGNOSIS — E119 Type 2 diabetes mellitus without complications: Secondary | ICD-10-CM | POA: Insufficient documentation

## 2014-07-24 MED ORDER — ERGOCALCIFEROL 1.25 MG (50000 UT) PO CAPS: 50000.0000 [IU] | ORAL_CAPSULE | ORAL | Status: DC

## 2014-07-24 MED ORDER — METFORMIN HCL 500 MG PO TABS: ORAL_TABLET | ORAL | Status: DC

## 2014-07-24 MED ORDER — BUPROPION HCL ER (SR) 150 MG PO TB12: ORAL_TABLET | ORAL | Status: DC

## 2014-07-24 NOTE — Telephone Encounter (Signed)
Called pt w/ labs, meds sent in

## 2014-08-05 ENCOUNTER — Encounter: Payer: Self-pay | Admitting: Family Medicine

## 2014-10-29 ENCOUNTER — Ambulatory Visit (INDEPENDENT_AMBULATORY_CARE_PROVIDER_SITE_OTHER): Payer: 59 | Admitting: Family Medicine

## 2014-10-29 ENCOUNTER — Other Ambulatory Visit: Payer: Self-pay | Admitting: Family Medicine

## 2014-10-29 VITALS — BP 112/80 | HR 98 | Temp 98.0°F | Resp 18 | Ht 63.5 in | Wt 259.0 lb

## 2014-10-29 DIAGNOSIS — E119 Type 2 diabetes mellitus without complications: Secondary | ICD-10-CM | POA: Diagnosis not present

## 2014-10-29 DIAGNOSIS — E669 Obesity, unspecified: Secondary | ICD-10-CM | POA: Diagnosis not present

## 2014-10-29 DIAGNOSIS — M25572 Pain in left ankle and joints of left foot: Secondary | ICD-10-CM

## 2014-10-29 DIAGNOSIS — E1169 Type 2 diabetes mellitus with other specified complication: Secondary | ICD-10-CM

## 2014-10-29 LAB — POCT CBC
Granulocyte percent: 65.8 %G (ref 37–80)
HCT, POC: 41.3 % (ref 37.7–47.9)
Hemoglobin: 13.7 g/dL (ref 12.2–16.2)
Lymph, poc: 3.4 (ref 0.6–3.4)
MCH, POC: 27.7 pg (ref 27–31.2)
MCHC: 33.1 g/dL (ref 31.8–35.4)
MCV: 83.8 fL (ref 80–97)
MID (cbc): 0.1 (ref 0–0.9)
MPV: 7 fL (ref 0–99.8)
POC Granulocyte: 6.8 (ref 2–6.9)
POC LYMPH PERCENT: 33.4 % (ref 10–50)
POC MID %: 0.8 %M (ref 0–12)
Platelet Count, POC: 338 10*3/uL (ref 142–424)
RBC: 4.93 M/uL (ref 4.04–5.48)
RDW, POC: 14.4 %
WBC: 10.3 10*3/uL — AB (ref 4.6–10.2)

## 2014-10-29 LAB — POCT GLYCOSYLATED HEMOGLOBIN (HGB A1C): Hemoglobin A1C: 5.6
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
## 2014-10-30 LAB — COMPLETE METABOLIC PANEL WITHOUT GFR
Alkaline Phosphatase: 57 U/L (ref 39–117)
CO2: 27 meq/L (ref 19–32)
Glucose, Bld: 88 mg/dL (ref 70–99)

## 2014-10-30 LAB — COMPLETE METABOLIC PANEL WITH GFR
ALT: 64 U/L — ABNORMAL HIGH (ref 0–35)
AST: 30 U/L (ref 0–37)
Albumin: 4.3 g/dL (ref 3.5–5.2)
BUN: 17 mg/dL (ref 6–23)
Calcium: 9.7 mg/dL (ref 8.4–10.5)
Chloride: 103 mEq/L (ref 96–112)
Creat: 0.71 mg/dL (ref 0.50–1.10)
GFR, Est African American: >89 mL/min
GFR, Est Non African American: >89 mL/min
Potassium: 4.8 mEq/L (ref 3.5–5.3)
Sodium: 138 mEq/L (ref 135–145)
Total Bilirubin: 0.2 mg/dL (ref 0.2–1.2)
Total Protein: 7.1 g/dL (ref 6.0–8.3)

## 2014-10-30 LAB — RHEUMATOID FACTOR: Rheumatoid fact SerPl-aCnc: <10 [IU]/mL (ref <=14)

## 2014-10-30 LAB — SEDIMENTATION RATE: Sed Rate: 14 mm/hr (ref 0–20)

## 2014-10-31 ENCOUNTER — Telehealth: Payer: Self-pay

## 2014-10-31 LAB — HEPATITIS PANEL, ACUTE
HCV Ab: NEGATIVE
Hep A IgM: NONREACTIVE
Hep B C IgM: NONREACTIVE
Hepatitis B Surface Ag: NEGATIVE

## 2014-10-31 LAB — B. BURGDORFI ANTIBODIES: B burgdorferi Ab IgG+IgM: 0.19 {ISR}

## 2014-10-31 LAB — ROCKY MTN SPOTTED FVR AB, IGM-BLOOD: ROCKY MTN SPOTTED FEVER, IGM: 0.5 IV

## 2014-10-31 NOTE — Telephone Encounter (Signed)
Pt is wanting to let us know that even though the test for lime disease has not gotten back yet she would like to go ahead to start the antibodics that she and dr Conley Rolls talked about

## 2014-11-01 NOTE — Telephone Encounter (Signed)
Spoke with patient, all labs are normal. We did not check TSH since she had it done in April. She was somewhat better, stayed home and was resting and did not go to work last 2 days. Lyme, RMSF were negative. She has had back surgery and was recent, still has mobic and advise her to try that, if no improvemen then she can come back to office or call me. She does not want any stronger meds at this time. NO doxycycline for now.

## 2014-11-10 ENCOUNTER — Ambulatory Visit (INDEPENDENT_AMBULATORY_CARE_PROVIDER_SITE_OTHER): Payer: 59 | Admitting: Family Medicine

## 2014-11-10 VITALS — BP 110/82 | HR 95 | Temp 98.3°F | Resp 20 | Ht 64.0 in | Wt 259.0 lb

## 2014-11-10 DIAGNOSIS — M2550 Pain in unspecified joint: Secondary | ICD-10-CM | POA: Diagnosis not present

## 2014-11-10 DIAGNOSIS — R740 Nonspecific elevation of levels of transaminase and lactic acid dehydrogenase [LDH]: Secondary | ICD-10-CM

## 2014-11-10 DIAGNOSIS — Z6841 Body Mass Index (BMI) 40.0 and over, adult: Secondary | ICD-10-CM

## 2014-11-10 DIAGNOSIS — I00 Rheumatic fever without heart involvement: Secondary | ICD-10-CM | POA: Diagnosis not present

## 2014-11-10 DIAGNOSIS — M1380 Other specified arthritis, unspecified site: Secondary | ICD-10-CM

## 2014-11-10 DIAGNOSIS — R7401 Elevation of levels of liver transaminase levels: Secondary | ICD-10-CM
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
## 2014-11-10 NOTE — Addendum Note (Signed)
Addended by: Norberto Sorenson on: 11/10/2014 11:25 PM   Modules accepted: Kipp Brood

## 2014-11-11 MED ORDER — FLUCONAZOLE 150 MG PO TABS: 150.0000 mg | ORAL_TABLET | Freq: Once | ORAL | Status: DC

## 2014-11-11 NOTE — Addendum Note (Signed)
Addended by: Norberto Sorenson on: 11/11/2014 12:25 AM   Modules accepted: Orders, SmartSet

## 2014-11-16 ENCOUNTER — Ambulatory Visit (INDEPENDENT_AMBULATORY_CARE_PROVIDER_SITE_OTHER): Payer: 59 | Admitting: Family Medicine

## 2014-11-16 ENCOUNTER — Encounter: Payer: Self-pay | Admitting: Family Medicine

## 2014-11-16 VITALS — BP 110/70 | HR 90 | Temp 98.4°F | Ht 64.0 in | Wt 258.0 lb

## 2014-11-16 DIAGNOSIS — J029 Acute pharyngitis, unspecified: Secondary | ICD-10-CM

## 2014-11-16 DIAGNOSIS — I00 Rheumatic fever without heart involvement: Secondary | ICD-10-CM | POA: Diagnosis not present

## 2014-11-16 DIAGNOSIS — M2550 Pain in unspecified joint: Secondary | ICD-10-CM | POA: Diagnosis not present

## 2014-11-16 DIAGNOSIS — R7401 Elevation of levels of liver transaminase levels: Secondary | ICD-10-CM

## 2014-11-16 DIAGNOSIS — M1380 Other specified arthritis, unspecified site: Secondary | ICD-10-CM

## 2014-11-16 DIAGNOSIS — R740 Nonspecific elevation of levels of transaminase and lactic acid dehydrogenase [LDH]: Secondary | ICD-10-CM | POA: Diagnosis not present

## 2014-11-16 LAB — POCT CBC
Granulocyte percent: 71.3 %G (ref 37–80)
HCT, POC: 40.4 % (ref 37.7–47.9)
Hemoglobin: 13.1 g/dL (ref 12.2–16.2)
Lymph, poc: 1.9 (ref 0.6–3.4)
MCH, POC: 27.6 pg (ref 27–31.2)
MCHC: 32.5 g/dL (ref 31.8–35.4)
MCV: 84.9 fL (ref 80–97)
MID (cbc): 0.5 (ref 0–0.9)
MPV: 6.8 fL (ref 0–99.8)
POC GRANULOCYTE: 5.9 (ref 2–6.9)
POC LYMPH %: 22.3 % (ref 10–50)
POC MID %: 6.4 %M (ref 0–12)
Platelet Count, POC: 360 10*3/uL (ref 142–424)
RBC: 4.76 M/uL (ref 4.04–5.48)
RDW, POC: 13.9 %
WBC: 8.3 10*3/uL (ref 4.6–10.2)

## 2014-11-16 LAB — POCT SEDIMENTATION RATE: POCT SED RATE: 30 mm/h — AB (ref 0–22)

## 2014-11-16 LAB — COMPREHENSIVE METABOLIC PANEL
ALBUMIN: 4.2 g/dL (ref 3.6–5.1)
ALT: 50 U/L — ABNORMAL HIGH (ref 6–29)
AST: 27 U/L (ref 10–30)
Alkaline Phosphatase: 54 U/L (ref 33–115)
BUN: 16 mg/dL (ref 7–25)
CO2: 27 mmol/L (ref 20–31)
Calcium: 9.1 mg/dL (ref 8.6–10.2)
Chloride: 101 mmol/L (ref 98–110)
Creat: 0.85 mg/dL (ref 0.50–1.10)
GLUCOSE: 141 mg/dL — AB (ref 65–99)
POTASSIUM: 4.6 mmol/L (ref 3.5–5.3)
SODIUM: 137 mmol/L (ref 135–146)
Total Bilirubin: 0.4 mg/dL (ref 0.2–1.2)
Total Protein: 6.7 g/dL (ref 6.1–8.1)

## 2014-11-16 LAB — POCT RAPID STREP A (OFFICE): Rapid Strep A Screen: NEGATIVE

## 2014-11-16 LAB — C-REACTIVE PROTEIN: CRP: 1.2 mg/dL — AB (ref <0.60)

## 2014-11-16 MED ORDER — FIRST-DUKES MOUTHWASH MT SUSP: 5.0000 mL | OROMUCOSAL | Status: DC | PRN

## 2014-11-16 MED ORDER — MELOXICAM 15 MG PO TABS: 15.0000 mg | ORAL_TABLET | Freq: Every day | ORAL | Status: DC

## 2014-11-16 NOTE — Patient Instructions (Signed)
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
## 2014-11-17 LAB — CULTURE, GROUP A STREP: Organism ID, Bacteria: NORMAL

## 2014-11-18 LAB — B. BURGDORFI ANTIBODIES: B burgdorferi Ab IgG+IgM: 0.23 {ISR}

## 2014-11-18 LAB — EPSTEIN-BARR VIRUS VCA ANTIBODY PANEL
EBV EA IgG: <5 U/mL (ref <9.0)
EBV NA IgG: <3 U/mL (ref <18.0)
EBV VCA IgG: <10 U/mL (ref <18.0)

## 2014-11-19 LAB — ROCKY MTN SPOTTED FVR AB, IGM-BLOOD: ROCKY MTN SPOTTED FEVER, IGM: 0.52 IV

## 2014-11-19 LAB — CMV IGM: CMV IgM: <8 AU/mL (ref <30.00)

## 2014-11-19 LAB — CYTOMEGALOVIRUS ANTIBODY, IGG

## 2014-11-22 ENCOUNTER — Telehealth: Payer: Self-pay

## 2014-11-22 NOTE — Telephone Encounter (Signed)
Pt called about labs. Notified that letter was sent and what it said

## 2014-12-08 ENCOUNTER — Encounter: Payer: Self-pay | Admitting: Family Medicine

## 2014-12-11 ENCOUNTER — Ambulatory Visit (INDEPENDENT_AMBULATORY_CARE_PROVIDER_SITE_OTHER): Payer: Commercial Managed Care - HMO | Admitting: Family Medicine

## 2014-12-11 ENCOUNTER — Ambulatory Visit (INDEPENDENT_AMBULATORY_CARE_PROVIDER_SITE_OTHER): Payer: Commercial Managed Care - HMO

## 2014-12-11 VITALS — BP 112/77 | HR 91 | Temp 98.4°F | Resp 16 | Ht 64.0 in | Wt 261.0 lb

## 2014-12-11 DIAGNOSIS — M2550 Pain in unspecified joint: Secondary | ICD-10-CM

## 2014-12-11 DIAGNOSIS — M546 Pain in thoracic spine: Secondary | ICD-10-CM

## 2014-12-11 DIAGNOSIS — M542 Cervicalgia: Secondary | ICD-10-CM

## 2014-12-11 DIAGNOSIS — R5382 Chronic fatigue, unspecified: Secondary | ICD-10-CM

## 2014-12-11 MED ORDER — PREDNISONE 10 MG PO TABS
ORAL_TABLET | ORAL | Status: DC
Start: 2014-12-11 — End: 2014-12-26
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
## 2014-12-11 NOTE — Patient Instructions (Signed)
Fibromyalgia Fibromyalgia is a disorder that is often misunderstood. It is associated with muscular pains and tenderness that comes and goes. It is often associated with fatigue and sleep disturbances. Though it tends to be long-lasting, fibromyalgia is not life-threatening. CAUSES  The exact cause of fibromyalgia is unknown. People with certain gene types are predisposed to developing fibromyalgia and other conditions. Certain factors can play a role as triggers, such as:  Spine disorders.  Arthritis.  Severe injury (trauma) and other physical stressors.  Emotional stressors. SYMPTOMS   The main symptom is pain and stiffness in the muscles and joints, which can vary over time.  Sleep and fatigue problems. Other related symptoms may include:  Bowel and bladder problems.  Headaches.  Visual problems.  Problems with odors and noises.  Depression or mood changes.  Painful periods (dysmenorrhea).  Dryness of the skin or eyes. DIAGNOSIS  There are no specific tests for diagnosing fibromyalgia. Patients can be diagnosed accurately from the specific symptoms they have. The diagnosis is made by determining that nothing else is causing the problems. TREATMENT  There is no cure. Management includes medicines and an active, healthy lifestyle. The goal is to enhance physical fitness, decrease pain, and improve sleep. HOME CARE INSTRUCTIONS   Only take over-the-counter or prescription medicines as directed by your caregiver. Sleeping pills, tranquilizers, and pain medicines may make your problems worse.  Low-impact aerobic exercise is very important and advised for treatment. At first, it may seem to make pain worse. Gradually increasing your tolerance will overcome this feeling.  Learning relaxation techniques and how to control stress will help you. Biofeedback, visual imagery, hypnosis, muscle relaxation, yoga, and meditation are all options.  Anti-inflammatory medicines and  physical therapy may provide short-term help.  Acupuncture or massage treatments may help.  Take muscle relaxant medicines as suggested by your caregiver.  Avoid stressful situations.  Plan a healthy lifestyle. This includes your diet, sleep, rest, exercise, and friends.  Find and practice a hobby you enjoy.  Join a fibromyalgia support group for interaction, ideas, and sharing advice. This may be helpful. SEEK MEDICAL CARE IF:  You are not having good results or improvement from your treatment. FOR MORE INFORMATION  National Fibromyalgia Association: www.fmaware.[place]: www.arthritis.org Document Released: 03/29/2005 Document Revised: 06/21/2011 Document Reviewed: 07/09/2009 [facility] Patient Information 2015 [place], [place]. This information is not intended to replace advice given to you by your health care provider. Make sure you discuss any questions you have with your health care provider. Chronic Fatigue Syndrome Chronic fatigue syndrome (CFS) is a condition in which there is lasting, extreme tiredness (fatigue) that does not improve with rest. CFS affects women up to four times more often than men. If you have CFS, fatigue and other symptoms can make it hard for you to get through your day. There is no treatment or cure. You will need to work closely with your health care provider to come up with a treatment plan that works for you. CAUSES  No one knows what causes CFS. It may be triggered by a flu-like illness or by mono. Other triggers may include:  An abnormal immune system.  Low blood pressure.  Poor diet.  Physical or emotional stress. SIGNS AND SYMPTOMS The main symptom is fatigue that lasts all day, especially after physical or mental stress. Other common symptoms include:  An extreme loss of energy with no obvious cause.  Muscle or joint soreness.  Severe weakness.  Frequent headaches.  Fever.  Sore throat.  Swollen lymph glands.  Sleep  is not refreshing.  Loss of concentration or memory. Less common symptoms may include:  Chills.  Night sweats.  Tingling or numbness.  Blurred vision.  Dizziness.  Sensitivity to noise or odors.  Mood swings.  Anxiety, panic attacks, and depression. Your symptoms may come and go, or you may have them all the time. DIAGNOSIS  There are no tests that can help health care providers diagnose CFS. It may take a long time for you to get a correct diagnosis. Your health care provider may need to do a number of tests to rule out other conditions that could be causing your symptoms. You may be diagnosed with CFS if:  You have fatigue that has lasted for at least six months.  Your fatigue is not relieved by rest.  Your fatigue is not caused by another condition.  Your fatigue is severe enough to interfere with work and daily activities.  You have at least four common symptoms of CFS. TREATMENT  There is no cure for CFS at this time. The condition affects everyone differently. You will need to work with your health care provider to find the best treatment for your symptoms. Treatment may include:  Improving sleep with a regular bedtime routine.  Avoiding caffeine, alcohol, and tobacco.  Doing light exercise and stretching during the day.  Taking medicine to help you sleep.  Taking over-the-counter medicines to relieve joint or muscle pain.  Learning and practicing relaxation techniques.  Using memory aids or doing brain teasers to improve memory and concentration.  Seeing a mental health professional to evaluate and treat depression, if necessary.  Trying massage therapy, acupuncture, and movement exercises, like yoga or tai chi. [place] Work closely with your health care provider to follow your treatment plan at home. You may need to make major lifestyle changes. If treatment does not seem to help, get a second opinion. You may get help from many health  care providers, including doctors, mental health specialists, physical therapists, and rehabilitation therapists. Having the support of friends and loved ones is also important. SEEK MEDICAL CARE IF:  Your symptoms are not responding to treatment.  You are having strong feelings of anger, guilt, anxiety, or depression. Document Released: 05/06/2004 Document Revised: 08/13/2013 Document Reviewed: 02/16/2013 [facility] Patient Information 2015 [place], [place]. This information is not intended to replace advice given to you by your health care provider. Make sure you discuss any questions you have with your health care provider. Crohn Disease Crohn disease is a long-term (chronic) soreness and redness (inflammation) of the intestines (bowel). It can affect any portion of the digestive tract, from the mouth to the anus. It can also cause problems outside the digestive tract. Crohn disease is closely related to a disease called ulcerative colitis (together, these two diseases are called inflammatory bowel disease).  CAUSES  The cause of Crohn disease is not known. One Link Snuffer is that, in an easily affected person, the immune system is triggered to attack the body's own digestive tissue. Crohn disease runs in families. It seems to be more common in certain geographic areas and amongst certain races. There are no clear-cut dietary causes.  SYMPTOMS  Crohn disease can cause many different symptoms since it can affect many different parts of the body. Symptoms include:  Fatigue.  Weight loss.  Chronic diarrhea, sometime bloody.  Abdominal pain and cramps.  Fever.  Ulcers or canker sores in the mouth or rectum.  Anemia (low red blood  cells).  Arthritis, skin problems, and eye problems may occur. Complications of Crohn disease can include:  Series of holes (perforation) of the bowel.  Portions of the intestines sticking to each other (adhesions).  Obstruction of the bowel.  Fistula formation,  typically in the rectal area but also in other areas. A fistula is an opening between the bowels and the outside, or between the bowels and another organ.  A painful crack in the mucous membrane of the anus (rectal fissure). DIAGNOSIS  Your caregiver may suspect Crohn disease based on your symptoms and an exam. Blood tests may confirm that there is a problem. You may be asked to submit a stool specimen for examination. X-rays and CT scans may be necessary. Ultimately, the diagnosis is usually made after a procedure that uses a flexible tube that is inserted via your mouth or your anus. This is done under sedation and is called either an upper endoscopy or colonoscopy. With these tests, the specialist can take tiny tissue samples and remove them from the inside of the bowel (biopsy). Examination of this biopsy tissue under a microscope can reveal Crohn disease as the cause of your symptoms. Due to the many different forms that Crohn disease can take, symptoms may be present for several years before a diagnosis is made. TREATMENT  Medications are often used to decrease inflammation and control the immune system. These include medicines related to aspirin, steroid medications, and newer and stronger medications to slow down the immune system. Some medications may be used as suppositories or enemas. A number of other medications are used or have been studied. Your caregiver will make specific recommendations. HOME CARE INSTRUCTIONS   Symptoms such as diarrhea can be controlled with medications. Avoid foods that have a laxative effect such as fresh fruit, vegetables, and dairy products. During flare-ups, you can rest your bowel by refraining from solid foods. Drink clear liquids frequently during the day. (Electrolyte or rehydrating fluids are best. Your caregiver can help you with suggestions.) Drink often to prevent loss of body fluids (dehydration). When diarrhea has cleared, eat small meals and more  frequently. Avoid food additives and stimulants such as caffeine (coffee, tea, or chocolate). Enzyme supplements may help if you develop intolerance to a sugar in dairy products (lactose). Ask your caregiver or dietitian about specific dietary instructions.  Try to maintain a positive attitude. Learn relaxation techniques such as self-hypnosis, mental imaging, and muscle relaxation.  If possible, avoid stresses which can aggravate your condition.  Exercise regularly.  Follow your diet.  Always get plenty of rest. SEEK MEDICAL CARE IF:   Your symptoms fail to improve after a week or two of new treatment.  You experience continued weight loss.  You have ongoing cramps or loose bowels.  You develop a new skin rash, skin sores, or eye problems. SEEK IMMEDIATE MEDICAL CARE IF:   You have worsening of your symptoms or develop new symptoms.  You have a fever.  You develop bloody diarrhea.  You develop severe abdominal pain. MAKE SURE YOU:   Understand these instructions.  Will watch your condition.  Will get help right away if you are not doing well or get worse. Document Released: 01/06/2005 Document Revised: 08/13/2013 Document Reviewed: 12/05/2006 [facility] Patient Information 2015 [place], [place]. This information is not intended to replace advice given to you by your health care provider. Make sure you discuss any questions you have with your health care provider.

## 2014-12-12 LAB — CBC WITH DIFFERENTIAL/PLATELET
BASOS ABS: 0 10*3/uL (ref 0.0–0.1)
BASOS PCT: 0 % (ref 0–1)
EOS ABS: 0.3 10*3/uL (ref 0.0–0.7)
EOS PCT: 3 % (ref 0–5)
HCT: 40.6 % (ref 36.0–46.0)
Hemoglobin: 13.8 g/dL (ref 12.0–15.0)
LYMPHS ABS: 2.7 10*3/uL (ref 0.7–4.0)
Lymphocytes Relative: 26 % (ref 12–46)
MCH: 28.3 pg (ref 26.0–34.0)
MCHC: 34 g/dL (ref 30.0–36.0)
MCV: 83.2 fL (ref 78.0–100.0)
MONOS PCT: 5 % (ref 3–12)
MPV: 9.7 fL (ref 8.6–12.4)
Monocytes Absolute: 0.5 10*3/uL (ref 0.1–1.0)
Neutro Abs: 6.7 10*3/uL (ref 1.7–7.7)
Neutrophils Relative %: 66 % (ref 43–77)
PLATELETS: 378 10*3/uL (ref 150–400)
RBC: 4.88 MIL/uL (ref 3.87–5.11)
RDW: 14.3 % (ref 11.5–15.5)
WBC: 10.2 10*3/uL (ref 4.0–10.5)

## 2014-12-12 LAB — COMPREHENSIVE METABOLIC PANEL
ALT: 36 U/L — ABNORMAL HIGH (ref 6–29)
AST: 20 U/L (ref 10–30)
Albumin: 4.5 g/dL (ref 3.6–5.1)
Alkaline Phosphatase: 68 U/L (ref 33–115)
BUN: 23 mg/dL (ref 7–25)
CHLORIDE: 104 mmol/L (ref 98–110)
CO2: 26 mmol/L (ref 20–31)
Calcium: 9.3 mg/dL (ref 8.6–10.2)
Creat: 0.74 mg/dL (ref 0.50–1.10)
GLUCOSE: 87 mg/dL (ref 65–99)
POTASSIUM: 5 mmol/L (ref 3.5–5.3)
Sodium: 137 mmol/L (ref 135–146)
Total Bilirubin: 0.2 mg/dL (ref 0.2–1.2)
Total Protein: 7.4 g/dL (ref 6.1–8.1)

## 2014-12-12 LAB — FERRITIN: FERRITIN: 31 ng/mL (ref 10–291)

## 2014-12-12 LAB — IRON: Iron: 38 ug/dL — ABNORMAL LOW (ref 40–190)

## 2014-12-12 LAB — C-REACTIVE PROTEIN: CRP: 0.8 mg/dL — ABNORMAL HIGH (ref <0.60)

## 2014-12-12 LAB — GAMMA GT: GGT: 28 U/L (ref 7–51)

## 2014-12-12 LAB — SEDIMENTATION RATE: Sed Rate: 12 mm/hr (ref 0–20)

## 2014-12-12 LAB — VITAMIN B12: VITAMIN B 12: 1300 pg/mL — AB (ref 211–911)

## 2014-12-13 LAB — CYCLIC CITRUL PEPTIDE ANTIBODY, IGG

## 2014-12-13 LAB — ANTI-NUCLEAR AB-TITER (ANA TITER): ANA TITER 1: NEGATIVE (ref <1:40)

## 2014-12-13 LAB — ANA: ANA: POSITIVE — AB
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
## 2014-12-16 ENCOUNTER — Encounter: Payer: Self-pay | Admitting: Family Medicine

## 2014-12-17 ENCOUNTER — Encounter: Payer: Self-pay | Admitting: Family Medicine

## 2014-12-26 ENCOUNTER — Encounter: Payer: Self-pay | Admitting: Family Medicine

## 2014-12-26 ENCOUNTER — Ambulatory Visit (INDEPENDENT_AMBULATORY_CARE_PROVIDER_SITE_OTHER): Payer: Commercial Managed Care - HMO | Admitting: Family Medicine

## 2014-12-26 VITALS — BP 104/74 | HR 94 | Temp 98.0°F | Resp 16 | Ht 64.0 in | Wt 267.0 lb

## 2014-12-26 DIAGNOSIS — R7309 Other abnormal glucose: Secondary | ICD-10-CM | POA: Diagnosis not present

## 2014-12-26 DIAGNOSIS — R7303 Prediabetes: Secondary | ICD-10-CM

## 2014-12-26 DIAGNOSIS — Z6841 Body Mass Index (BMI) 40.0 and over, adult: Secondary | ICD-10-CM

## 2014-12-26 DIAGNOSIS — F4310 Post-traumatic stress disorder, unspecified: Secondary | ICD-10-CM | POA: Diagnosis not present

## 2014-12-26 DIAGNOSIS — F418 Other specified anxiety disorders: Secondary | ICD-10-CM | POA: Diagnosis not present

## 2014-12-26 DIAGNOSIS — Z119 Encounter for screening for infectious and parasitic diseases, unspecified: Secondary | ICD-10-CM | POA: Diagnosis not present

## 2014-12-26 MED ORDER — CLONAZEPAM 0.5 MG PO TABS
0.2500 mg | ORAL_TABLET | Freq: Every day | ORAL | Status: DC
Start: 2014-12-26 — End: 2015-02-13
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
## 2014-12-26 NOTE — Patient Instructions (Signed)
Go down to 1 wellbutrin in the morning and stop it in 1 wk.   Take 1/2 klonopin while at work - can take a second dose if needed.  Try not to use outside of work Try to take the trazodone every night. Do start back with the women's dinners and bible study. Make sure you like your therapist - you need someone who is very smart who is not going to let you get away with not processing your stuff -  Lets see you back in 1 mo. In the meantime, if we need to start a different medication for depression (prozac, celexa, or zoloft) we could. I will try to be more responsive.  If you are ever having scary thoughts you can ALWAYS call me on my cell: [phone]    Some excellent private psychologists for individual counseling are:   [facility] [address] [place], [place] [address] Phone: [phone]  [facility]  [address], [place], [place] [address]  [phone]  [facility] [address] [place], [place] [address]  Phone: [phone]  [facility] - Ron Parker [address] [place], [place] [address]  [phone]   Charlies Silvers, MD, PA [address] [place], [place] [address] Phone: [phone]

## 2014-12-27 ENCOUNTER — Ambulatory Visit: Payer: 59 | Admitting: Family Medicine

## 2014-12-27 LAB — HIV ANTIBODY (ROUTINE TESTING W REFLEX): HIV 1&2 Ab, 4th Generation: NONREACTIVE

## 2014-12-27 LAB — MICROALBUMIN, URINE: Microalb, Ur: 0.3 mg/dL (ref <2.0)

## 2015-01-10 ENCOUNTER — Telehealth: Payer: Self-pay | Admitting: Family Medicine

## 2015-01-10 MED ORDER — DULOXETINE HCL 60 MG PO CPEP: 60.0000 mg | ORAL_CAPSULE | Freq: Every day | ORAL | Status: DC

## 2015-01-10 NOTE — Telephone Encounter (Signed)
Saw Dr. Evelene Croon who thought that she just needed sleeping. Has tried saphris, lexapro, paxil, wellbutrin, lamictal, trazodone  Was on effexor and cymbalta.

## 2015-02-13 ENCOUNTER — Ambulatory Visit (INDEPENDENT_AMBULATORY_CARE_PROVIDER_SITE_OTHER): Payer: Commercial Managed Care - HMO | Admitting: Family Medicine

## 2015-02-13 VITALS — BP 110/76 | HR 87 | Temp 98.5°F | Resp 16 | Ht 64.0 in | Wt 280.8 lb

## 2015-02-13 DIAGNOSIS — F4310 Post-traumatic stress disorder, unspecified: Secondary | ICD-10-CM | POA: Diagnosis not present

## 2015-02-13 DIAGNOSIS — F418 Other specified anxiety disorders: Secondary | ICD-10-CM

## 2015-02-13 DIAGNOSIS — B085 Enteroviral vesicular pharyngitis: Secondary | ICD-10-CM | POA: Diagnosis not present

## 2015-02-13 DIAGNOSIS — Z6841 Body Mass Index (BMI) 40.0 and over, adult: Secondary | ICD-10-CM | POA: Diagnosis not present

## 2015-02-13 DIAGNOSIS — M2550 Pain in unspecified joint: Secondary | ICD-10-CM

## 2015-02-13 DIAGNOSIS — E282 Polycystic ovarian syndrome: Secondary | ICD-10-CM | POA: Diagnosis not present

## 2015-02-13 MED ORDER — CLONAZEPAM 0.5 MG PO TABS: 0.2500 mg | ORAL_TABLET | Freq: Every day | ORAL | Status: DC

## 2015-02-13 MED ORDER — DULOXETINE HCL 60 MG PO CPEP: 60.0000 mg | ORAL_CAPSULE | Freq: Every day | ORAL | Status: DC

## 2015-02-13 MED ORDER — TRAZODONE HCL 50 MG PO TABS: 50.0000 mg | ORAL_TABLET | Freq: Every evening | ORAL | Status: DC | PRN

## 2015-02-13 MED ORDER — NYSTATIN 100000 UNIT/ML MT SUSP: 5.0000 mL | Freq: Four times a day (QID) | OROMUCOSAL | Status: DC
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
## 2015-02-17 ENCOUNTER — Other Ambulatory Visit: Payer: Self-pay

## 2015-02-17 NOTE — Telephone Encounter (Signed)
[facility] sent req for RF of clonazepam and vit D. Dr Clelia CroftShaw, it looks like you had intended the clonazepam written at [facility] on 11/3 to be faxed to [facility], but local pharm had it, so I do not believe it was faxed to [facility] too. I cancelled the Rx at local pharm and [facility] want a fax and not for it to be called in. So, I have pended it again for you to sign and I will fax to [facility]. Also, pending vit D for review. When you Rxd in April, you wanted pt to take until her return visit and were planning to re-check lab. I don't see that the lab was done on 11/3 though. Do you want to OK the other 6 mos of Vit D, or have pt RTC for lab?

## 2015-02-19 NOTE — Telephone Encounter (Signed)
Yes, fine to cont on vit D and fax in klonopin. Will sign when i am in office, thanks

## 2015-02-20 ENCOUNTER — Ambulatory Visit (INDEPENDENT_AMBULATORY_CARE_PROVIDER_SITE_OTHER): Payer: Commercial Managed Care - HMO | Admitting: Physician Assistant

## 2015-02-20 VITALS — BP 140/98 | HR 104 | Temp 98.9°F | Resp 18 | Ht 63.5 in | Wt 286.0 lb

## 2015-02-20 DIAGNOSIS — R6883 Chills (without fever): Secondary | ICD-10-CM | POA: Diagnosis not present

## 2015-02-20 DIAGNOSIS — F418 Other specified anxiety disorders: Secondary | ICD-10-CM

## 2015-02-20 DIAGNOSIS — R739 Hyperglycemia, unspecified: Secondary | ICD-10-CM

## 2015-02-20 DIAGNOSIS — R7303 Prediabetes: Secondary | ICD-10-CM

## 2015-02-20 LAB — POCT CBC
GRANULOCYTE PERCENT: 67.3 % (ref 37–80)
HEMATOCRIT: 40.6 % (ref 37.7–47.9)
Hemoglobin: 13.3 g/dL (ref 12.2–16.2)
LYMPH, POC: 3 (ref 0.6–3.4)
MCH, POC: 28.1 pg (ref 27–31.2)
MCHC: 32.8 g/dL (ref 31.8–35.4)
MCV: 85.7 fL (ref 80–97)
MID (CBC): 0.2 (ref 0–0.9)
MPV: 7.1 fL (ref 0–99.8)
POC GRANULOCYTE: 6.5 (ref 2–6.9)
POC LYMPH %: 30.6 % (ref 10–50)
POC MID %: 2.1 %M (ref 0–12)
Platelet Count, POC: 383 10*3/uL (ref 142–424)
RBC: 4.74 M/uL (ref 4.04–5.48)
RDW, POC: 14 %
WBC: 9.7 10*3/uL (ref 4.6–10.2)

## 2015-02-20 LAB — POCT URINALYSIS DIP (MANUAL ENTRY)
Bilirubin, UA: NEGATIVE
Blood, UA: NEGATIVE
GLUCOSE UA: NEGATIVE
LEUKOCYTES UA: NEGATIVE
Nitrite, UA: NEGATIVE
Protein Ur, POC: NEGATIVE
SPEC GRAV UA: 1.025
Urobilinogen, UA: 0.2
pH, UA: 5.5

## 2015-02-20 LAB — POCT GLYCOSYLATED HEMOGLOBIN (HGB A1C): HEMOGLOBIN A1C: 6.1

## 2015-02-20 LAB — GLUCOSE, POCT (MANUAL RESULT ENTRY): POC Glucose: 128 mg/dl — AB (ref 70–99)

## 2015-02-20 LAB — HEMOGLOBIN A1C: HEMOGLOBIN A1C: 6.1 % — AB (ref 4.0–6.0)

## 2015-02-20 NOTE — Progress Notes (Signed)
Kerri Ford [id number] DOB: 1980-08-16  Subjective:  Pt presents to clinic with concerns that she is having trouble sleeping and night sweats and just does not feel well.  She wants to make sure there is not a medical cause for her symptoms because she knows that her anxiety will sometimes present like this. She has no current cold symptoms.  She is having no current urinary symptoms.  She does have some LLQ pain that she feels is from her constipation that she has been having recently.  Her anxiety recently has been worse as has her depression.  She chews a lot of gum with xylitol and she knows she has a sore tongue blister reaction to artifical sweetners.  She has problems with seasonal allergies but her congestion is no worse than normal.  Her blisters and sore throat have not resolved though they are better.  She has tried nasal sprays but they do not help much and she has a netti pot but has not been using it but can.  Pre-menstrual- she is typically more emotional  Psychiatrist - Dr Marlyne Beards In therapy for years - Fleeta Emmer  Patient Active Problem List   Diagnosis Date Noted  . Hyperglycemia 02/20/2015  . PTSD (post-traumatic stress disorder) 07/24/2014  . Prediabetes 07/24/2014  . DDD (degenerative disc disease), lumbar 07/24/2014  . Migraine   . Morbid obesity with BMI of 45.0-49.9, adult (HCC) 05/20/2012  . Depression with anxiety 12/31/2011  . PCOS (polycystic ovarian syndrome) 08/11/2011  . Diverticulitis of sigmoid colon 10/09/2010    Current Outpatient Prescriptions on File Prior to Visit  Medication Sig Dispense Refill  . clonazePAM (KLONOPIN) 0.5 MG tablet Take 0.5-1 tablets (0.25-0.5 mg total) by mouth daily. As needed for anxiety at work 90 tablet 0  . ergocalciferol (VITAMIN D2) 50000 UNITS capsule Take 1 capsule (50,000 Units total) by mouth once a week. 12 capsule 1   No current facility-administered medications on file prior to visit.    Allergies    Allergen Reactions  . Latex Itching and Cough  . Aspartame And Phenylalanine Other (See Comments)    Inflamed esophagus    Review of Systems  Constitutional: Positive for fever (subective) and chills.  HENT: Positive for congestion, postnasal drip and sore throat.   Respiratory: Negative for cough.   Allergic/Immunologic: Positive for environmental allergies.  Psychiatric/Behavioral: Positive for sleep disturbance (2nd to night sweats ) and dysphoric mood. The patient is nervous/anxious.    Objective:  BP 140/98 mmHg  Pulse 104  Temp(Src) 98.9 F (37.2 C) (Oral)  Resp 18  Ht 5' 3.5" (1.613 m)  Wt 286 lb (129.729 kg)  BMI 49.86 kg/m2  SpO2 99%  LMP 01/28/2015  Physical Exam  Constitutional: She is oriented to person, place, and time and well-developed, well-nourished, and in no distress.  HENT:  Head: Normocephalic and atraumatic.  Right Ear: Hearing, tympanic membrane, external ear and ear canal normal.  Left Ear: Hearing, tympanic membrane, external ear and ear canal normal.  Nose: Mucosal edema (red) and rhinorrhea (clear) present.  Mouth/Throat: Uvula is midline, oropharynx is clear and moist and mucous membranes are normal. No posterior oropharyngeal edema or posterior oropharyngeal erythema.  No blisters seen  Eyes: Conjunctivae are normal.  Neck: Normal range of motion.  Cardiovascular: Normal rate, regular rhythm and normal heart sounds.   No murmur heard. Pulmonary/Chest: Effort normal and breath sounds normal.  Neurological: She is alert and oriented to person, place, and time. Gait normal.  Skin: Skin is warm and dry.  Psychiatric: Mood, memory, affect and judgment normal.  Vitals reviewed.  Results for orders placed or performed in visit on 02/20/15  POCT CBC  Result Value Ref Range   WBC 9.7 4.6 - 10.2 K/uL   Lymph, poc 3.0 0.6 - 3.4   POC LYMPH PERCENT 30.6 10 - 50 %L   MID (cbc) 0.2 0 - 0.9   POC MID % 2.1 0 - 12 %M   POC Granulocyte 6.5 2 - 6.9    Granulocyte percent 67.3 37 - 80 %G   RBC 4.74 4.04 - 5.48 M/uL   Hemoglobin 13.3 12.2 - 16.2 g/dL   HCT, POC 16.140.6 09.637.7 - 47.9 %   MCV 85.7 80 - 97 fL   MCH, POC 28.1 27 - 31.2 pg   MCHC 32.8 31.8 - 35.4 g/dL   RDW, POC 04.514.0 %   Platelet Count, POC 383 142 - 424 K/uL   MPV 7.1 0 - 99.8 fL  POCT urinalysis dipstick  Result Value Ref Range   Color, UA yellow yellow   Clarity, UA clear clear   Glucose, UA negative negative   Bilirubin, UA negative negative   Ketones, POC UA trace (5) (A) negative   Spec Grav, UA 1.025    Blood, UA negative negative   pH, UA 5.5    Protein Ur, POC negative negative   Urobilinogen, UA 0.2    Nitrite, UA Negative Negative   Leukocytes, UA Negative Negative  POCT glycosylated hemoglobin (Hb A1C)  Result Value Ref Range   Hemoglobin A1C 6.1   POCT glucose (manual entry)  Result Value Ref Range   POC Glucose 128 (A) 70 - 99 mg/dl    Assessment and Plan :  Chills - Plan: POCT CBC, POCT urinalysis dipstick  Depression with anxiety  Prediabetes  Hyperglycemia - Plan: POCT glycosylated hemoglobin (Hb A1C), POCT glucose (manual entry)   I suspect her symptoms are related to her recent increase in anxiety - she will f/u with her psychiatrist.  I wonder if her sore throat that is remaining is from PND - she will try a neti pot to flush out her sinuses and than if that is not better after a week then she will stop the gum to see if that is a possible cause.  She will continue her regular medications.  Her metformin does not need to be changed at this time. She will f/u prn.    Benny LennertSarah Irl Bodie PA-C  [facility] 02/25/2015 7:15 PM

## 2015-02-21 ENCOUNTER — Telehealth: Payer: Self-pay | Admitting: *Deleted

## 2015-02-21 NOTE — Telephone Encounter (Signed)
[facility] calling to see if pt can get a 90 day rx for clonazepam and vitamin D

## 2015-02-21 NOTE — Telephone Encounter (Signed)
yes

## 2015-02-24 MED ORDER — CLONAZEPAM 0.5 MG PO TABS: 0.2500 mg | ORAL_TABLET | Freq: Every day | ORAL | Status: DC

## 2015-02-24 MED ORDER — ERGOCALCIFEROL 1.25 MG (50000 UT) PO CAPS: 50000.0000 [IU] | ORAL_CAPSULE | ORAL | Status: DC

## 2015-02-24 NOTE — Telephone Encounter (Signed)
Faxed Klonopin.

## 2015-02-25 NOTE — Telephone Encounter (Signed)
Rx faxed

## 2015-03-10 ENCOUNTER — Encounter: Payer: Self-pay | Admitting: Family Medicine

## 2015-05-08 ENCOUNTER — Ambulatory Visit (INDEPENDENT_AMBULATORY_CARE_PROVIDER_SITE_OTHER): Payer: Commercial Managed Care - HMO | Admitting: Family Medicine

## 2015-05-08 VITALS — BP 136/84 | HR 97 | Temp 97.2°F | Resp 20 | Ht 64.0 in | Wt 296.8 lb

## 2015-05-08 DIAGNOSIS — G43809 Other migraine, not intractable, without status migrainosus: Secondary | ICD-10-CM

## 2015-05-08 DIAGNOSIS — J0120 Acute ethmoidal sinusitis, unspecified: Secondary | ICD-10-CM

## 2015-05-08 MED ORDER — FLUTICASONE PROPIONATE 50 MCG/ACT NA SUSP: 2.0000 | Freq: Every day | NASAL | Status: DC

## 2015-05-08 MED ORDER — FLUCONAZOLE 150 MG PO TABS: 150.0000 mg | ORAL_TABLET | Freq: Once | ORAL | Status: DC

## 2015-05-08 MED ORDER — AMOXICILLIN 500 MG PO CAPS: 1000.0000 mg | ORAL_CAPSULE | Freq: Two times a day (BID) | ORAL | Status: DC
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
## 2015-05-08 NOTE — Patient Instructions (Signed)
Take the mucinex and amoxicillin twice a day with food. Take the flonase right before bed. Call or text if you are not feeling well or if there is anything else I cam help with.  You are doing AMAZING!!!!   I am so proud of you!   Sinus Rinse WHAT IS A SINUS RINSE? A sinus rinse is a simple home treatment that is used to rinse your sinuses with a sterile mixture of salt and water (saline solution). Sinuses are air-filled spaces in your skull behind the bones of your face and forehead that open into your nasal cavity. You will use the following:  Saline solution.  Neti pot or spray bottle. This releases the saline solution into your nose and through your sinuses. Neti pots and spray bottles can be purchased at [employer], a health food store, or online. WHEN WOULD I DO A SINUS RINSE? A sinus rinse can help to clear mucus, dirt, dust, or pollen from the nasal cavity. You may do a sinus rinse when you have a cold, a virus, nasal allergy symptoms, a sinus infection, or stuffiness in the nose or sinuses. If you are considering a sinus rinse:  Ask your child's health care provider before performing a sinus rinse on your child.  Do not do a sinus rinse if you have had ear or nasal surgery, ear infection, or blocked ears. HOW DO I DO A SINUS RINSE?  Wash your hands.  Disinfect your device according to the directions provided and then dry it.  Use the solution that comes with your device or one that is sold separately in stores. Follow the mixing directions on the package.  Fill your device with the amount of saline solution as directed by the device instructions.  Stand over a sink and tilt your head sideways over the sink.  Place the spout of the device in your upper nostril (the one closer to the ceiling).  Gently pour or squeeze the saline solution into the nasal cavity. The liquid should drain to the lower nostril if you are not overly congested.  Gently blow your nose.  Blowing too hard may cause ear pain.  Repeat in the other nostril.  Clean and rinse your device with clean water and then air-dry it. ARE THERE RISKS OF A SINUS RINSE?  Sinus rinse is generally very safe and effective. However, there are a few risks, which include:   A burning sensation in the sinuses. This may happen if you do not make the saline solution as directed. Make sure to follow all directions when making the saline solution.  Infection from contaminated water. This is rare, but possible.  Nasal irritation.   This information is not intended to replace advice given to you by your health care provider. Make sure you discuss any questions you have with your health care provider.   Document Released: 10/24/2013 Document Reviewed: 10/24/2013 Elsevier Interactive Patient Education [employer]

## 2015-05-23 ENCOUNTER — Encounter: Payer: Self-pay | Admitting: Family Medicine

## 2015-05-26 ENCOUNTER — Ambulatory Visit (INDEPENDENT_AMBULATORY_CARE_PROVIDER_SITE_OTHER): Payer: Managed Care, Other (non HMO) | Admitting: Family Medicine

## 2015-05-26 VITALS — BP 134/80 | HR 103 | Temp 98.1°F | Resp 18 | Wt 299.2 lb

## 2015-05-26 DIAGNOSIS — J0141 Acute recurrent pansinusitis: Secondary | ICD-10-CM | POA: Diagnosis not present

## 2015-05-26 MED ORDER — METHYLPREDNISOLONE ACETATE 80 MG/ML IJ SUSP
80.0000 mg | Freq: Once | INTRAMUSCULAR | Status: AC
  Administered 2015-05-26: 80 mg via INTRAMUSCULAR

## 2015-05-26 MED ORDER — METHYLPREDNISOLONE ACETATE 80 MG/ML IJ SUSP: 80.0000 mg | Freq: Once | INTRAMUSCULAR | Status: DC

## 2015-05-26 MED ORDER — DULOXETINE HCL 60 MG PO CPEP: 60.0000 mg | ORAL_CAPSULE | Freq: Every day | ORAL | Status: DC

## 2015-05-26 MED ORDER — LEVOFLOXACIN 750 MG PO TABS: 750.0000 mg | ORAL_TABLET | Freq: Every day | ORAL | Status: DC

## 2015-05-26 MED ORDER — FLUCONAZOLE 150 MG PO TABS: 150.0000 mg | ORAL_TABLET | Freq: Once | ORAL | Status: DC

## 2015-05-26 MED ORDER — ERGOCALCIFEROL 1.25 MG (50000 UT) PO CAPS
50000.0000 [IU] | ORAL_CAPSULE | ORAL | Status: DC
Start: 2015-05-26 — End: 2015-09-01

## 2015-05-26 MED ORDER — METFORMIN HCL 500 MG PO TABS: 500.0000 mg | ORAL_TABLET | Freq: Two times a day (BID) | ORAL | Status: DC

## 2015-05-26 NOTE — Patient Instructions (Addendum)
Keep with the sudafed and the face enemas ;). Lets do your lab work in 3 months or so at your full physical.  Sinusitis, Adult Sinusitis is redness, soreness, and inflammation of the paranasal sinuses. Paranasal sinuses are air pockets within the bones of your face. They are located beneath your eyes, in the middle of your forehead, and above your eyes. In healthy paranasal sinuses, mucus is able to drain out, and air is able to circulate through them by way of your nose. However, when your paranasal sinuses are inflamed, mucus and air can become trapped. This can allow bacteria and other germs to grow and cause infection. Sinusitis can develop quickly and last only a short time (acute) or continue over a long period (chronic). Sinusitis that lasts for more than 12 weeks is considered chronic. CAUSES Causes of sinusitis include:  Allergies.  Structural abnormalities, such as displacement of the cartilage that separates your nostrils (deviated septum), which can decrease the air flow through your nose and sinuses and affect sinus drainage.  Functional abnormalities, such as when the small hairs (cilia) that line your sinuses and help remove mucus do not work properly or are not present. SIGNS AND SYMPTOMS Symptoms of acute and chronic sinusitis are the same. The primary symptoms are pain and pressure around the affected sinuses. Other symptoms include:  Upper toothache.  Earache.  Headache.  Bad breath.  Decreased sense of smell and taste.  A cough, which worsens when you are lying flat.  Fatigue.  Fever.  Thick drainage from your nose, which often is green and may contain pus (purulent).  Swelling and warmth over the affected sinuses. DIAGNOSIS Your health care provider will perform a physical exam. During your exam, your health care provider may perform any of the following to help determine if you have acute sinusitis or chronic sinusitis:  Look in your nose for signs of  abnormal growths in your nostrils (nasal polyps).  Tap over the affected sinus to check for signs of infection.  View the inside of your sinuses using an imaging device that has a light attached (endoscope). If your health care provider suspects that you have chronic sinusitis, one or more of the following tests may be recommended:  Allergy tests.  Nasal culture. A sample of mucus is taken from your nose, sent to a lab, and screened for bacteria.  Nasal cytology. A sample of mucus is taken from your nose and examined by your health care provider to determine if your sinusitis is related to an allergy. TREATMENT Most cases of acute sinusitis are related to a viral infection and will resolve on their own within 10 days. Sometimes, medicines are prescribed to help relieve symptoms of both acute and chronic sinusitis. These may include pain medicines, decongestants, nasal steroid sprays, or saline sprays. However, for sinusitis related to a bacterial infection, your health care provider will prescribe antibiotic medicines. These are medicines that will help kill the bacteria causing the infection. Rarely, sinusitis is caused by a fungal infection. In these cases, your health care provider will prescribe antifungal medicine. For some cases of chronic sinusitis, surgery is needed. Generally, these are cases in which sinusitis recurs more than 3 times per year, despite other treatments. HOME CARE INSTRUCTIONS  Drink plenty of water. Water helps thin the mucus so your sinuses can drain more easily.  Use a humidifier.  Inhale steam 3-4 times a day (for example, sit in the bathroom with the shower running).  Apply a warm,  moist washcloth to your face 3-4 times a day, or as directed by your health care provider.  Use saline nasal sprays to help moisten and clean your sinuses.  Take medicines only as directed by your health care provider.  If you were prescribed either an antibiotic or antifungal  medicine, finish it all even if you start to feel better. SEEK IMMEDIATE MEDICAL CARE IF:  You have increasing pain or severe headaches.  You have nausea, vomiting, or drowsiness.  You have swelling around your face.  You have vision problems.  You have a stiff neck.  You have difficulty breathing.   This information is not intended to replace advice given to you by your health care provider. Make sure you discuss any questions you have with your health care provider.   Document Released: 03/29/2005 Document Revised: 04/19/2014 Document Reviewed: 04/13/2011 Elsevier Interactive Patient Education [employer].
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
## 2015-05-29 ENCOUNTER — Telehealth: Payer: Self-pay

## 2015-05-29 NOTE — Telephone Encounter (Signed)
Pt is needing to talk with dr Clelia Croft she is not feeling any better from last visit-she has had to leave work due to being sick  Best number [phone]

## 2015-05-31 NOTE — Telephone Encounter (Signed)
Dr. Shaw  Please see previous message 

## 2015-06-02 MED ORDER — IPRATROPIUM BROMIDE 0.03 % NA SOLN: 2.0000 | Freq: Four times a day (QID) | NASAL | Status: DC

## 2015-06-02 MED ORDER — LEVOCETIRIZINE DIHYDROCHLORIDE 5 MG PO TABS: 5.0000 mg | ORAL_TABLET | Freq: Every evening | ORAL | Status: DC

## 2015-06-02 MED ORDER — BUDESONIDE 32 MCG/ACT NA SUSP: 2.0000 | Freq: Every day | NASAL | Status: DC

## 2015-06-02 NOTE — Telephone Encounter (Signed)
Has been congested still.

## 2015-06-19 ENCOUNTER — Telehealth: Payer: Self-pay | Admitting: Family Medicine

## 2015-06-19 MED ORDER — CLARITHROMYCIN ER 500 MG PO TB24: 1000.0000 mg | ORAL_TABLET | Freq: Every day | ORAL | Status: DC

## 2015-06-19 NOTE — Telephone Encounter (Signed)
Pt has been having increased sinus pressure and sore throat. Thinks allergies triggered it and is on allergy meds and doing netti pot but now having green and bloody purulent nasal discharge, fatigue, sweats, sinus pressure.  Eyes and throat a little itchy.  Was on amox in Jan and levaquin in Feb for sinus infections so called in course of clarithromycin to treat.  If unsuccessful, RTC in the next 4-5d.

## 2015-06-22 ENCOUNTER — Ambulatory Visit (INDEPENDENT_AMBULATORY_CARE_PROVIDER_SITE_OTHER): Payer: Managed Care, Other (non HMO) | Admitting: Family Medicine

## 2015-06-22 ENCOUNTER — Telehealth: Payer: Self-pay

## 2015-06-22 VITALS — BP 112/78 | HR 106 | Temp 98.6°F | Resp 18 | Ht 65.7 in | Wt 298.6 lb

## 2015-06-22 DIAGNOSIS — J0191 Acute recurrent sinusitis, unspecified: Secondary | ICD-10-CM

## 2015-06-22 LAB — POCT CBC
Granulocyte percent: 76 %G (ref 37–80)
HCT, POC: 37 % — AB (ref 37.7–47.9)
Hemoglobin: 13.2 g/dL (ref 12.2–16.2)
Lymph, poc: 1.9 (ref 0.6–3.4)
MCH, POC: 30.7 pg (ref 27–31.2)
MCHC: 35.7 g/dL — AB (ref 31.8–35.4)
MCV: 86.2 fL (ref 80–97)
MID (cbc): 0.5 (ref 0–0.9)
MPV: 7 fL (ref 0–99.8)
POC Granulocyte: 7.4 — AB (ref 2–6.9)
POC LYMPH PERCENT: 19.3 %L (ref 10–50)
POC MID %: 4.7 %M (ref 0–12)
Platelet Count, POC: 287 10*3/uL (ref 142–424)
RBC: 4.29 M/uL (ref 4.04–5.48)
RDW, POC: 13.7 %
WBC: 9.7 10*3/uL (ref 4.6–10.2)

## 2015-06-22 LAB — POCT SEDIMENTATION RATE: POCT SED RATE: 44 mm/hr — AB (ref 0–22)

## 2015-06-22 MED ORDER — HYDROCOD POLST-CPM POLST ER 10-8 MG/5ML PO SUER: 5.0000 mL | Freq: Two times a day (BID) | ORAL | Status: DC | PRN

## 2015-06-22 MED ORDER — FLUCONAZOLE 150 MG PO TABS: 150.0000 mg | ORAL_TABLET | Freq: Once | ORAL | Status: DC

## 2015-06-22 NOTE — Telephone Encounter (Signed)
Patient was seen today and was expecting a prescription for diflucan. Patient states she needs it by tomorrow.
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
## 2015-06-22 NOTE — Patient Instructions (Addendum)
Hot showers or breathing in steam may help loosen the congestion.  Use a humidifier next to your bed.  Use the atrovent nasal spray as needed throughout the day and start the steroid nasal spray every night before bed for at least 2 weeks in a week.  I recommend augmenting with  generic mucinex to help you move out the congestion.  If no improvement or you are getting worse, text me or come back as you might need a course of steroids but hopefully with all of the above, you can avoid it.  Sinusitis, Adult Sinusitis is redness, soreness, and inflammation of the paranasal sinuses. Paranasal sinuses are air pockets within the bones of your face. They are located beneath your eyes, in the middle of your forehead, and above your eyes. In healthy paranasal sinuses, mucus is able to drain out, and air is able to circulate through them by way of your nose. However, when your paranasal sinuses are inflamed, mucus and air can become trapped. This can allow bacteria and other germs to grow and cause infection. Sinusitis can develop quickly and last only a short time (acute) or continue over a long period (chronic). Sinusitis that lasts for more than 12 weeks is considered chronic. CAUSES Causes of sinusitis include:  Allergies.  Structural abnormalities, such as displacement of the cartilage that separates your nostrils (deviated septum), which can decrease the air flow through your nose and sinuses and affect sinus drainage.  Functional abnormalities, such as when the small hairs (cilia) that line your sinuses and help remove mucus do not work properly or are not present. SIGNS AND SYMPTOMS Symptoms of acute and chronic sinusitis are the same. The primary symptoms are pain and pressure around the affected sinuses. Other symptoms include:  Upper toothache.  Earache.  Headache.  Bad breath.  Decreased sense of smell and taste.  A cough, which worsens when you are lying  flat.  Fatigue.  Fever.  Thick drainage from your nose, which often is green and may contain pus (purulent).  Swelling and warmth over the affected sinuses. DIAGNOSIS Your health care provider will perform a physical exam. During your exam, your health care provider may perform any of the following to help determine if you have acute sinusitis or chronic sinusitis:  Look in your nose for signs of abnormal growths in your nostrils (nasal polyps).  Tap over the affected sinus to check for signs of infection.  View the inside of your sinuses using an imaging device that has a light attached (endoscope). If your health care provider suspects that you have chronic sinusitis, one or more of the following tests may be recommended:  Allergy tests.  Nasal culture. A sample of mucus is taken from your nose, sent to a lab, and screened for bacteria.  Nasal cytology. A sample of mucus is taken from your nose and examined by your health care provider to determine if your sinusitis is related to an allergy. TREATMENT Most cases of acute sinusitis are related to a viral infection and will resolve on their own within 10 days. Sometimes, medicines are prescribed to help relieve symptoms of both acute and chronic sinusitis. These may include pain medicines, decongestants, nasal steroid sprays, or saline sprays. However, for sinusitis related to a bacterial infection, your health care provider will prescribe antibiotic medicines. These are medicines that will help kill the bacteria causing the infection. Rarely, sinusitis is caused by a fungal infection. In these cases, your health care provider will prescribe  antifungal medicine. For some cases of chronic sinusitis, surgery is needed. Generally, these are cases in which sinusitis recurs more than 3 times per year, despite other treatments. HOME CARE INSTRUCTIONS  Drink plenty of water. Water helps thin the mucus so your sinuses can drain more  easily.  Use a humidifier.  Inhale steam 3-4 times a day (for example, sit in the bathroom with the shower running).  Apply a warm, moist washcloth to your face 3-4 times a day, or as directed by your health care provider.  Use saline nasal sprays to help moisten and clean your sinuses.  Take medicines only as directed by your health care provider.  If you were prescribed either an antibiotic or antifungal medicine, finish it all even if you start to feel better. SEEK IMMEDIATE MEDICAL CARE IF:  You have increasing pain or severe headaches.  You have nausea, vomiting, or drowsiness.  You have swelling around your face.  You have vision problems.  You have a stiff neck.  You have difficulty breathing.   This information is not intended to replace advice given to you by your health care provider. Make sure you discuss any questions you have with your health care provider.   Document Released: 03/29/2005 Document Revised: 04/19/2014 Document Reviewed: 04/13/2011 Elsevier Interactive Patient Education [employer]2016 [employer] Inc.

## 2015-06-22 NOTE — Telephone Encounter (Signed)
rx sent in and pt notified 

## 2015-07-24 ENCOUNTER — Emergency Department (HOSPITAL_COMMUNITY)
Admission: EM | Admit: 2015-07-24 | Discharge: 2015-07-25 | Disposition: A | Discharge status: Home / Self Care | Payer: Managed Care, Other (non HMO) | Attending: Emergency Medicine | Admitting: Emergency Medicine

## 2015-07-24 ENCOUNTER — Encounter (HOSPITAL_COMMUNITY): Payer: Self-pay

## 2015-07-24 DIAGNOSIS — Z7951 Long term (current) use of inhaled steroids: Secondary | ICD-10-CM | POA: Diagnosis not present

## 2015-07-24 DIAGNOSIS — G8929 Other chronic pain: Secondary | ICD-10-CM | POA: Insufficient documentation

## 2015-07-24 DIAGNOSIS — M549 Dorsalgia, unspecified: Secondary | ICD-10-CM | POA: Insufficient documentation

## 2015-07-24 DIAGNOSIS — Z792 Long term (current) use of antibiotics: Secondary | ICD-10-CM | POA: Insufficient documentation

## 2015-07-24 DIAGNOSIS — F332 Major depressive disorder, recurrent severe without psychotic features: Secondary | ICD-10-CM | POA: Diagnosis not present

## 2015-07-24 DIAGNOSIS — Z79899 Other long term (current) drug therapy: Secondary | ICD-10-CM | POA: Insufficient documentation

## 2015-07-24 DIAGNOSIS — Z3202 Encounter for pregnancy test, result negative: Secondary | ICD-10-CM | POA: Diagnosis not present

## 2015-07-24 DIAGNOSIS — F1310 Sedative, hypnotic or anxiolytic abuse, uncomplicated: Secondary | ICD-10-CM | POA: Insufficient documentation

## 2015-07-24 DIAGNOSIS — Z91040 Latex allergy status: Secondary | ICD-10-CM | POA: Diagnosis not present

## 2015-07-24 DIAGNOSIS — G43909 Migraine, unspecified, not intractable, without status migrainosus: Secondary | ICD-10-CM | POA: Insufficient documentation

## 2015-07-24 DIAGNOSIS — Z7984 Long term (current) use of oral hypoglycemic drugs: Secondary | ICD-10-CM | POA: Insufficient documentation

## 2015-07-24 DIAGNOSIS — E669 Obesity, unspecified: Secondary | ICD-10-CM | POA: Diagnosis not present

## 2015-07-24 DIAGNOSIS — F419 Anxiety disorder, unspecified: Secondary | ICD-10-CM | POA: Diagnosis not present

## 2015-07-24 DIAGNOSIS — R45851 Suicidal ideations: Secondary | ICD-10-CM | POA: Diagnosis present

## 2015-07-24 LAB — COMPREHENSIVE METABOLIC PANEL
ALBUMIN: 3.9 g/dL (ref 3.5–5.0)
ALT: 32 U/L (ref 14–54)
ANION GAP: 12 (ref 5–15)
AST: 20 U/L (ref 15–41)
Alkaline Phosphatase: 56 U/L (ref 38–126)
BUN: 12 mg/dL (ref 6–20)
CHLORIDE: 104 mmol/L (ref 101–111)
CO2: 22 mmol/L (ref 22–32)
Calcium: 9.3 mg/dL (ref 8.9–10.3)
Creatinine, Ser: 0.7 mg/dL (ref 0.44–1.00)
GFR calc non Af Amer: >60 mL/min (ref >60)
GLUCOSE: 142 mg/dL — AB (ref 65–99)
Potassium: 4.1 mmol/L (ref 3.5–5.1)
SODIUM: 138 mmol/L (ref 135–145)
Total Bilirubin: 0.4 mg/dL (ref 0.3–1.2)
Total Protein: 7.2 g/dL (ref 6.5–8.1)

## 2015-07-24 LAB — CBC
HCT: 40.6 % (ref 36.0–46.0)
HEMOGLOBIN: 13.1 g/dL (ref 12.0–15.0)
MCH: 28.6 pg (ref 26.0–34.0)
MCHC: 32.3 g/dL (ref 30.0–36.0)
MCV: 88.6 fL (ref 78.0–100.0)
Platelets: 346 10*3/uL (ref 150–400)
RBC: 4.58 MIL/uL (ref 3.87–5.11)
RDW: 14.1 % (ref 11.5–15.5)
WBC: 9.9 10*3/uL (ref 4.0–10.5)

## 2015-07-24 LAB — ETHANOL: Alcohol, Ethyl (B): <5 mg/dL (ref <5)

## 2015-07-24 LAB — RAPID URINE DRUG SCREEN, HOSP PERFORMED
AMPHETAMINES: NOT DETECTED
BENZODIAZEPINES: POSITIVE — AB
Barbiturates: NOT DETECTED
COCAINE: NOT DETECTED
OPIATES: NOT DETECTED
TETRAHYDROCANNABINOL: NOT DETECTED

## 2015-07-24 LAB — ACETAMINOPHEN LEVEL

## 2015-07-24 LAB — I-STAT BETA HCG BLOOD, ED (MC, WL, AP ONLY): I-stat hCG, quantitative: <5 m[IU]/mL (ref <5)

## 2015-07-24 LAB — SALICYLATE LEVEL

## 2015-07-24 NOTE — ED Notes (Signed)
Pt here with c/o suicidal thoughts and states she is losing her battle with depression. 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 Pt is in counseling but states she has a lot of chronic pain and just wants to end her life to end her pain. She reports this has been going on for about a year.
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
## 2015-07-25 DIAGNOSIS — F332 Major depressive disorder, recurrent severe without psychotic features: Secondary | ICD-10-CM | POA: Diagnosis not present

## 2015-07-25 MED ORDER — LORAZEPAM 0.5 MG PO TABS
0.5000 mg | ORAL_TABLET | Freq: Four times a day (QID) | ORAL | Status: DC | PRN
  Administered 2015-07-25: 0.5 mg via ORAL
  Filled 2015-07-25: qty 1

## 2015-07-25 NOTE — Progress Notes (Addendum)
Per Hillery Jacksanika Lewis, [facility] at [facility], Patient is psychiatrically discharged for discharge and will need an appointment with [facility] IOP. CSW attempted to schedule an appt for [facility] but no answer likely due to Good Friday holiday. CSW provided Patient with the information to call and schedule [facility]. CSW also placed information in AVS. CSW signing off at this time. Please contact if new need(s) arise.    Lance MussAshley Gardner,MSW, LCSW [facility] ED/[facility]M Clinical Social Worker [phone]

## 2015-07-25 NOTE — ED Notes (Signed)
TTS in process 
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
## 2015-07-25 NOTE — ED Notes (Signed)
Pt. Kerri Ford is going home , Called her Ride they will be here in a few hours

## 2015-07-25 NOTE — ED Notes (Signed)
Patient was given a snack and drink, and a regular diet ordered for lunch. 

## 2015-07-25 NOTE — Discharge Instructions (Signed)
Stress and Stress Management °Stress is a normal reaction to life events. It is what you feel when life demands more than you are used to or more than you can handle. Some stress can be useful. For example, the stress reaction can help you catch the last bus of the day, study for a test, or meet a deadline at work. But stress that occurs too often or for too long can cause problems. It can affect your emotional health and interfere with relationships and normal daily activities. Too much stress can weaken your immune system and increase your risk for physical illness. If you already have a medical problem, stress can make it worse. °CAUSES  °All sorts of life events may cause stress. An event that causes stress for one person may not be stressful for another person. Major life events commonly cause stress. These may be positive or negative. Examples include losing your job, moving into a new home, getting married, having a baby, or losing a loved one. Less obvious life events may also cause stress, especially if they occur day after day or in combination. Examples include working long hours, driving in traffic, caring for children, being in debt, or being in a difficult relationship. °SIGNS AND SYMPTOMS °Stress may cause emotional symptoms including, the following: °· Anxiety. This is feeling worried, afraid, on edge, overwhelmed, or out of control. °· Anger. This is feeling irritated or impatient. °· Depression. This is feeling sad, down, helpless, or guilty. °· Difficulty focusing, remembering, or making decisions. °Stress may cause physical symptoms, including the following:  °· Aches and pains. These may affect your head, neck, back, stomach, or other areas of your body. °· Tight muscles or clenched jaw. °· Low energy or trouble sleeping.  °Stress may cause unhealthy behaviors, including the following:  °· Eating to feel better (overeating) or skipping meals. °· Sleeping too little, too much, or both. °· Working  too much or putting off tasks (procrastination). °· Smoking, drinking alcohol, or using drugs to feel better. °DIAGNOSIS  °Stress is diagnosed through an assessment by your health care provider. Your health care provider will ask questions about your symptoms and any stressful life events. Your health care provider will also ask about your medical history and may order blood tests or other tests. Certain medical conditions and medicine can cause physical symptoms similar to stress.  Mental illness can cause emotional symptoms and unhealthy behaviors similar to stress. Your health care provider may refer you to a mental health professional for further evaluation.  °TREATMENT  °Stress management is the recommended treatment for stress. The goals of stress management are reducing stressful life events and coping with stress in healthy ways.  °Techniques for reducing stressful life events include the following: °· Stress identification. Self-monitor for stress and identify what causes stress for you. These skills may help you to avoid some stressful events. °· Time management. Set your priorities, keep a calendar of events, and learn to say "no." These tools can help you avoid making too many commitments. °Techniques for coping with stress include the following: °· Rethinking the problem. Try to think realistically about stressful events rather than ignoring them or overreacting. Try to find the positives in a stressful situation rather than focusing on the negatives. °· Exercise. Physical exercise can release both physical and emotional tension. The key is to find a form of exercise you enjoy and do it regularly. °· Relaxation techniques. These relax the body and mind. Examples include yoga, meditation, tai chi, biofeedback, deep   breathing, progressive muscle relaxation, listening to music, being out in nature, journaling, and other hobbies. Again, the key is to find one or more that you enjoy and can do  regularly.  Healthy lifestyle. Eat a balanced diet, get plenty of sleep, and do not smoke. Avoid using alcohol or drugs to relax.  Strong support network. Spend time with family, friends, or other people you enjoy being around.Express your feelings and talk things over with someone you trust. Counseling or talktherapy with a mental health professional may be helpful if you are having difficulty managing stress on your own. Medicine is typically not recommended for the treatment of stress.Talk to your health care provider if you think you need medicine for symptoms of stress. HOME CARE INSTRUCTIONS  Keep all follow-up visits as directed by your health care provider.  Take all medicines as directed by your health care provider. SEEK MEDICAL CARE IF:  Your symptoms get worse or you start having new symptoms.  You feel overwhelmed by your problems and can no longer manage them on your own. SEEK IMMEDIATE MEDICAL CARE IF:  You feel like hurting yourself or someone else.   This information is not intended to replace advice given to you by your health care provider. Make sure you discuss any questions you have with your health care provider.   Document Released: 09/22/2000 Document Revised: 04/19/2014 Document Reviewed: 11/21/2012 Elsevier Interactive Patient Education [employer].

## 2015-07-25 NOTE — ED Provider Notes (Signed)
Patient was assessed by TTS team and deemed appropriate for discharge. Pt ambulating at baseline, no acute distress on exam, and otherwise medically clear. Plan to follow up as needed and return precautions discussed for worsening or new concerning symptoms.    Lyndal Pulleyaniel Milagro Belmares, MD 07/25/15 1323
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
## 2015-07-25 NOTE — ED Notes (Signed)
Pt.s belongings and cell phone given back to pt.  She is going home.  Information on outpatient treatment centers also given to pt.

## 2015-08-12 ENCOUNTER — Other Ambulatory Visit: Payer: Self-pay | Admitting: Family Medicine

## 2015-08-15 ENCOUNTER — Other Ambulatory Visit: Payer: Self-pay | Admitting: Family Medicine

## 2015-08-15 MED ORDER — CYCLOBENZAPRINE HCL 10 MG PO TABS: 10.0000 mg | ORAL_TABLET | Freq: Every day | ORAL | Status: DC

## 2015-08-16 ENCOUNTER — Ambulatory Visit (INDEPENDENT_AMBULATORY_CARE_PROVIDER_SITE_OTHER): Payer: Managed Care, Other (non HMO) | Admitting: Family Medicine

## 2015-08-16 VITALS — BP 128/86 | HR 114 | Temp 98.2°F | Resp 20 | Ht 65.5 in | Wt 310.2 lb

## 2015-08-16 DIAGNOSIS — N898 Other specified noninflammatory disorders of vagina: Secondary | ICD-10-CM | POA: Diagnosis not present

## 2015-08-16 DIAGNOSIS — B373 Candidiasis of vulva and vagina: Secondary | ICD-10-CM | POA: Diagnosis not present

## 2015-08-16 DIAGNOSIS — R102 Pelvic and perineal pain: Secondary | ICD-10-CM | POA: Diagnosis not present

## 2015-08-16 DIAGNOSIS — N3000 Acute cystitis without hematuria: Secondary | ICD-10-CM | POA: Diagnosis not present

## 2015-08-16 DIAGNOSIS — R350 Frequency of micturition: Secondary | ICD-10-CM | POA: Diagnosis not present

## 2015-08-16 DIAGNOSIS — G43809 Other migraine, not intractable, without status migrainosus: Secondary | ICD-10-CM

## 2015-08-16 DIAGNOSIS — B3731 Acute candidiasis of vulva and vagina: Secondary | ICD-10-CM

## 2015-08-16 LAB — POCT WET + KOH PREP: TRICH BY WET PREP: ABSENT

## 2015-08-16 LAB — POCT URINALYSIS DIP (MANUAL ENTRY)
Bilirubin, UA: NEGATIVE
Glucose, UA: NEGATIVE
Ketones, POC UA: NEGATIVE
NITRITE UA: NEGATIVE
PH UA: 7
PROTEIN UA: NEGATIVE
RBC UA: NEGATIVE
SPEC GRAV UA: 1.02
UROBILINOGEN UA: 0.2

## 2015-08-16 LAB — POC MICROSCOPIC URINALYSIS (UMFC): Mucus: ABSENT

## 2015-08-16 MED ORDER — NITROFURANTOIN MONOHYD MACRO 100 MG PO CAPS: 100.0000 mg | ORAL_CAPSULE | Freq: Two times a day (BID) | ORAL | Status: DC

## 2015-08-16 MED ORDER — FLUCONAZOLE 150 MG PO TABS: 150.0000 mg | ORAL_TABLET | Freq: Once | ORAL | Status: DC

## 2015-08-16 MED ORDER — NYSTATIN 100000 UNIT/GM EX POWD: Freq: Four times a day (QID) | CUTANEOUS | Status: DC

## 2015-08-16 NOTE — Patient Instructions (Signed)
Cutaneous Candidiasis Cutaneous candidiasis is a condition in which there is an overgrowth of yeast (candida) on the skin. Yeast normally live on the skin, but in small enough numbers not to cause any symptoms. In certain cases, increased growth of the yeast may cause an actual yeast infection. This kind of infection usually occurs in areas of the skin that are constantly warm and moist, such as the armpits or the groin. Yeast is the most common cause of diaper rash in babies and in people who cannot control their bowel movements (incontinence). CAUSES  The fungus that most often causes cutaneous candidiasis is Candida albicans. Conditions that can increase the risk of getting a yeast infection of the skin include:  Obesity.  Pregnancy.  Diabetes.  Taking antibiotic medicine.  Taking birth control pills.  Taking steroid medicines.  Thyroid disease.  An iron or zinc deficiency.  Problems with the immune system. SYMPTOMS   Red, swollen area of the skin.  Bumps on the skin.  Itchiness. DIAGNOSIS  The diagnosis of cutaneous candidiasis is usually based on its appearance. Light scrapings of the skin may also be taken and viewed under a microscope to identify the presence of yeast. TREATMENT  Antifungal creams may be applied to the infected skin. In severe cases, oral medicines may be needed.  HOME CARE INSTRUCTIONS   Keep your skin clean and dry.  Maintain a healthy weight.  If you have diabetes, keep your blood sugar under control. SEEK IMMEDIATE MEDICAL CARE IF:  Your rash continues to spread despite treatment.  You have a fever, chills, or abdominal pain.   This information is not intended to replace advice given to you by your health care provider. Make sure you discuss any questions you have with your health care provider.   Document Released: 12/15/2010 Document Revised: 06/21/2011 Document Reviewed: 09/30/2014 Elsevier Interactive Patient Education 2016 Elsevier  Inc.  Monilial Vaginitis Vaginitis in a soreness, swelling and redness (inflammation) of the vagina and vulva. Monilial vaginitis is not a sexually transmitted infection. CAUSES  Yeast vaginitis is caused by yeast (candida) that is normally found in your vagina. With a yeast infection, the candida has overgrown in number to a point that upsets the chemical balance. SYMPTOMS   White, thick vaginal discharge.  Swelling, itching, redness and irritation of the vagina and possibly the lips of the vagina (vulva).  Burning or painful urination.  Painful intercourse. DIAGNOSIS  Things that may contribute to monilial vaginitis are:  Postmenopausal and virginal states.  Pregnancy.  Infections.  Being tired, sick or stressed, especially if you had monilial vaginitis in the past.  Diabetes. Good control will help lower the chance.  Birth control pills.  Tight fitting garments.  Using bubble bath, feminine sprays, douches or deodorant tampons.  Taking certain medications that kill germs (antibiotics).  Sporadic recurrence can occur if you become ill. TREATMENT  Your caregiver will give you medication.  There are several kinds of anti monilial vaginal creams and suppositories specific for monilial vaginitis. For recurrent yeast infections, use a suppository or cream in the vagina 2 times a week, or as directed.  Anti-monilial or steroid cream for the itching or irritation of the vulva may also be used. Get your caregiver's permission.  Painting the vagina with methylene blue solution may help if the monilial cream does not work.  Eating yogurt may help prevent monilial vaginitis. HOME CARE INSTRUCTIONS   Finish all medication as prescribed.  Do not have sex until treatment is completed  or after your caregiver tells you it is okay.  Take warm sitz baths.  Do not douche.  Do not use tampons, especially scented ones.  Wear cotton underwear.  Avoid tight pants and panty  hose.  Tell your sexual partner that you have a yeast infection. They should go to their caregiver if they have symptoms such as mild rash or itching.  Your sexual partner should be treated as well if your infection is difficult to eliminate.  Practice safer sex. Use condoms.  Some vaginal medications cause latex condoms to fail. Vaginal medications that harm condoms are:  Cleocin cream.  Butoconazole (Femstat).  Terconazole (Terazol) vaginal suppository.  Miconazole (Monistat) (may be purchased over the counter). SEEK MEDICAL CARE IF:   You have a temperature by mouth above 102 F (38.9 C).  The infection is getting worse after 2 days of treatment.  The infection is not getting better after 3 days of treatment.  You develop blisters in or around your vagina.  You develop vaginal bleeding, and it is not your menstrual period.  You have pain when you urinate.  You develop intestinal problems.  You have pain with sexual intercourse.   This information is not intended to replace advice given to you by your health care provider. Make sure you discuss any questions you have with your health care provider.   Document Released: 01/06/2005 Document Revised: 06/21/2011 Document Reviewed: 09/30/2014 Elsevier Interactive Patient Education [employer]
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
## 2015-08-17 LAB — URINE CULTURE: Colony Count: 95000

## 2015-08-17 MED ORDER — FLUCONAZOLE 150 MG PO TABS: 150.0000 mg | ORAL_TABLET | Freq: Once | ORAL | Status: DC

## 2015-08-17 MED ORDER — CYCLOBENZAPRINE HCL ER 30 MG PO CP24: 30.0000 mg | ORAL_CAPSULE | Freq: Every day | ORAL | Status: DC

## 2015-09-01 ENCOUNTER — Other Ambulatory Visit: Payer: Self-pay

## 2015-09-01 MED ORDER — LEVOCETIRIZINE DIHYDROCHLORIDE 5 MG PO TABS: 5.0000 mg | ORAL_TABLET | Freq: Every evening | ORAL | Status: DC

## 2015-09-01 MED ORDER — ERGOCALCIFEROL 1.25 MG (50000 UT) PO CAPS: 50000.0000 [IU] | ORAL_CAPSULE | ORAL | Status: DC

## 2015-09-01 MED ORDER — DULOXETINE HCL 60 MG PO CPEP: 60.0000 mg | ORAL_CAPSULE | Freq: Every day | ORAL | Status: DC

## 2015-09-01 MED ORDER — METFORMIN HCL 500 MG PO TABS: 500.0000 mg | ORAL_TABLET | Freq: Two times a day (BID) | ORAL | Status: DC

## 2015-09-01 MED ORDER — CYCLOBENZAPRINE HCL 10 MG PO TABS: 10.0000 mg | ORAL_TABLET | Freq: Every day | ORAL | Status: DC

## 2015-09-04 ENCOUNTER — Ambulatory Visit (INDEPENDENT_AMBULATORY_CARE_PROVIDER_SITE_OTHER): Payer: Managed Care, Other (non HMO) | Admitting: Family Medicine

## 2015-09-04 ENCOUNTER — Encounter: Payer: Self-pay | Admitting: Family Medicine

## 2015-09-04 VITALS — BP 130/73 | HR 101 | Temp 98.6°F | Resp 16 | Ht 65.0 in | Wt 309.0 lb

## 2015-09-04 DIAGNOSIS — Z130 Encounter for screening for diseases of the blood and blood-forming organs and certain disorders involving the immune mechanism: Secondary | ICD-10-CM | POA: Diagnosis not present

## 2015-09-04 DIAGNOSIS — E282 Polycystic ovarian syndrome: Secondary | ICD-10-CM | POA: Diagnosis not present

## 2015-09-04 DIAGNOSIS — R7303 Prediabetes: Secondary | ICD-10-CM

## 2015-09-04 DIAGNOSIS — F332 Major depressive disorder, recurrent severe without psychotic features: Secondary | ICD-10-CM

## 2015-09-04 DIAGNOSIS — Z1329 Encounter for screening for other suspected endocrine disorder: Secondary | ICD-10-CM | POA: Diagnosis not present

## 2015-09-04 DIAGNOSIS — Z1389 Encounter for screening for other disorder: Secondary | ICD-10-CM | POA: Diagnosis not present

## 2015-09-04 DIAGNOSIS — G894 Chronic pain syndrome: Secondary | ICD-10-CM

## 2015-09-04 DIAGNOSIS — Z1383 Encounter for screening for respiratory disorder NEC: Secondary | ICD-10-CM | POA: Diagnosis not present

## 2015-09-04 DIAGNOSIS — E118 Type 2 diabetes mellitus with unspecified complications: Secondary | ICD-10-CM

## 2015-09-04 DIAGNOSIS — Z136 Encounter for screening for cardiovascular disorders: Secondary | ICD-10-CM

## 2015-09-04 DIAGNOSIS — E611 Iron deficiency: Secondary | ICD-10-CM | POA: Diagnosis not present

## 2015-09-04 DIAGNOSIS — Z6841 Body Mass Index (BMI) 40.0 and over, adult: Secondary | ICD-10-CM

## 2015-09-04 DIAGNOSIS — Z0000 Encounter for general adult medical examination without abnormal findings: Secondary | ICD-10-CM

## 2015-09-04 DIAGNOSIS — M797 Fibromyalgia: Secondary | ICD-10-CM

## 2015-09-04 LAB — POCT URINALYSIS DIP (MANUAL ENTRY)
GLUCOSE UA: NEGATIVE
Leukocytes, UA: NEGATIVE
Nitrite, UA: NEGATIVE
Protein Ur, POC: NEGATIVE
RBC UA: NEGATIVE
UROBILINOGEN UA: 0.2
pH, UA: 5

## 2015-09-04 LAB — LIPID PANEL
CHOL/HDL RATIO: 3.7 ratio (ref <=5.0)
CHOLESTEROL: 202 mg/dL — AB (ref 125–200)
HDL: 55 mg/dL (ref >=46)
LDL Cholesterol: 110 mg/dL (ref <130)
TRIGLYCERIDES: 183 mg/dL — AB (ref <150)
VLDL: 37 mg/dL — AB (ref <30)

## 2015-09-04 LAB — HEMOGLOBIN A1C
HEMOGLOBIN A1C: 6.9 % — AB (ref <5.7)
Mean Plasma Glucose: 151 mg/dL

## 2015-09-04 LAB — THYROID PANEL WITH TSH
Free Thyroxine Index: 2 (ref 1.4–3.8)
T3 UPTAKE: 24 % (ref 22–35)
T4 TOTAL: 8.2 ug/dL (ref 4.5–12.0)
TSH: 4.89 mIU/L — ABNORMAL HIGH

## 2015-09-04 LAB — POC MICROSCOPIC URINALYSIS (UMFC): MUCUS RE: ABSENT

## 2015-09-04 LAB — FERRITIN: FERRITIN: 37 ng/mL (ref 10–154)

## 2015-09-04 LAB — TSH: TSH: 4.83 mIU/L — ABNORMAL HIGH

## 2015-09-04 MED ORDER — DULOXETINE HCL 60 MG PO CPEP: 60.0000 mg | ORAL_CAPSULE | Freq: Two times a day (BID) | ORAL | Status: DC
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
## 2015-09-04 NOTE — Patient Instructions (Signed)
     IF you received an x-ray today, you will receive an invoice from [facility]. Please contact [facility] at [phone] with questions or concerns regarding your invoice.   IF you received labwork today, you will receive an invoice from [facility]. Please contact [facility] at [phone] with questions or concerns regarding your invoice.   Our billing staff will not be able to assist you with questions regarding bills from these companies.  You will be contacted with the lab results as soon as they are available. The fastest way to get your results is to activate your My Chart account. Instructions are located on the last page of this paperwork. If you have not heard from us regarding the results in 2 weeks, please contact this office.      

## 2015-09-07 MED ORDER — METFORMIN HCL 1000 MG PO TABS: 1000.0000 mg | ORAL_TABLET | Freq: Two times a day (BID) | ORAL | Status: DC

## 2015-09-09 DIAGNOSIS — G894 Chronic pain syndrome: Secondary | ICD-10-CM | POA: Insufficient documentation

## 2015-09-09 DIAGNOSIS — M797 Fibromyalgia: Secondary | ICD-10-CM | POA: Insufficient documentation

## 2015-10-11 ENCOUNTER — Ambulatory Visit (INDEPENDENT_AMBULATORY_CARE_PROVIDER_SITE_OTHER): Payer: Managed Care, Other (non HMO) | Admitting: Family Medicine

## 2015-10-11 VITALS — BP 118/80 | HR 113 | Temp 97.9°F | Resp 18 | Ht 65.0 in | Wt 306.4 lb

## 2015-10-11 DIAGNOSIS — R7989 Other specified abnormal findings of blood chemistry: Secondary | ICD-10-CM | POA: Diagnosis not present

## 2015-10-11 DIAGNOSIS — F329 Major depressive disorder, single episode, unspecified: Secondary | ICD-10-CM | POA: Diagnosis not present

## 2015-10-11 DIAGNOSIS — E11649 Type 2 diabetes mellitus with hypoglycemia without coma: Secondary | ICD-10-CM | POA: Diagnosis not present

## 2015-10-11 DIAGNOSIS — R5383 Other fatigue: Secondary | ICD-10-CM | POA: Diagnosis not present

## 2015-10-11 DIAGNOSIS — N3000 Acute cystitis without hematuria: Secondary | ICD-10-CM

## 2015-10-11 DIAGNOSIS — R197 Diarrhea, unspecified: Secondary | ICD-10-CM

## 2015-10-11 DIAGNOSIS — B3749 Other urogenital candidiasis: Secondary | ICD-10-CM | POA: Diagnosis not present

## 2015-10-11 LAB — POCT CBC
GRANULOCYTE PERCENT: 70.3 % (ref 37–80)
HCT, POC: 37.9 % (ref 37.7–47.9)
Hemoglobin: 13.1 g/dL (ref 12.2–16.2)
Lymph, poc: 2.1 (ref 0.6–3.4)
MCH, POC: 29.6 pg (ref 27–31.2)
MCHC: 34.5 g/dL (ref 31.8–35.4)
MCV: 85.8 fL (ref 80–97)
MID (cbc): 0.4 (ref 0–0.9)
MPV: 7 fL (ref 0–99.8)
PLATELET COUNT, POC: 312 10*3/uL (ref 142–424)
POC Granulocyte: 5.8 (ref 2–6.9)
POC LYMPH %: 25.4 % (ref 10–50)
POC MID %: 4.3 %M (ref 0–12)
RBC: 4.41 M/uL (ref 4.04–5.48)
RDW, POC: 13.5 %
WBC: 8.3 10*3/uL (ref 4.6–10.2)

## 2015-10-11 LAB — POCT URINALYSIS DIP (MANUAL ENTRY)
Bilirubin, UA: NEGATIVE
Blood, UA: NEGATIVE
Glucose, UA: NEGATIVE
Leukocytes, UA: NEGATIVE
Nitrite, UA: POSITIVE — AB
Protein Ur, POC: NEGATIVE
UROBILINOGEN UA: 0.2
pH, UA: 5.5

## 2015-10-11 LAB — GLUCOSE, POCT (MANUAL RESULT ENTRY): POC Glucose: 109 mg/dl — AB (ref 70–99)

## 2015-10-11 MED ORDER — NITROFURANTOIN MONOHYD MACRO 100 MG PO CAPS: 100.0000 mg | ORAL_CAPSULE | Freq: Two times a day (BID) | ORAL | Status: DC

## 2015-10-11 MED ORDER — BLOOD GLUCOSE MONITOR KIT: PACK | Status: DC

## 2015-10-11 MED ORDER — FLUCONAZOLE 150 MG PO TABS: 150.0000 mg | ORAL_TABLET | ORAL | Status: DC
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
## 2015-10-11 NOTE — Patient Instructions (Addendum)
IF you received an x-ray today, you will receive an invoice from [facility]. Please contact [facility] at [phone] with questions or concerns regarding your invoice.   IF you received labwork today, you will receive an invoice from [facility]. Please contact [facility] at [phone] with questions or concerns regarding your invoice.   Our billing staff will not be able to assist you with questions regarding bills from these companies.  You will be contacted with the lab results as soon as they are available. The fastest way to get your results is to activate your My Chart account. Instructions are located on the last page of this paperwork. If you have not heard from us regarding the results in 2 weeks, please contact this office.   Blood Glucose Monitoring, Adult Monitoring your blood glucose (also know as blood sugar) helps you to manage your diabetes. It also helps you and your health care provider monitor your diabetes and determine how well your treatment plan is working. WHY SHOULD YOU MONITOR YOUR BLOOD GLUCOSE?  It can help you understand how food, exercise, and medicine affect your blood glucose.  It allows you to know what your blood glucose is at any given moment. You can quickly tell if you are having low blood glucose (hypoglycemia) or high blood glucose (hyperglycemia).  It can help you and your health care provider know how to adjust your medicines.  It can help you understand how to manage an illness or adjust medicine for exercise. WHEN SHOULD YOU TEST? Your health care provider will help you decide how often you should check your blood glucose. This may depend on the type of diabetes you have, your diabetes control, or the types of medicines you are taking. Be sure to write down all of your blood glucose readings so that this information can be reviewed with your health care provider. See below for examples of testing  times that your health care provider may suggest. Type 1 Diabetes  Test at least 2 times per day if your diabetes is well controlled, if you are using an insulin pump, or if you perform multiple daily injections.  If your diabetes is not well controlled or if you are sick, you may need to test more often.  It is a good idea to also test:  Before every insulin injection.  Before and after exercise.  Between meals and 2 hours after a meal.  Occasionally between 2:00 a.m. and 3:00 a.m. Type 2 Diabetes  If you are taking insulin, test at least 2 times per day. However, it is best to test before every insulin injection.  If you take medicines by mouth (orally), test 2 times a day.  If you are on a controlled diet, test once a day.  If your diabetes is not well controlled or if you are sick, you may need to monitor more often. HOW TO MONITOR YOUR BLOOD GLUCOSE Supplies Needed  Blood glucose meter.  Test strips for your meter. Each meter has its own strips. You must use the strips that go with your own meter.  A pricking needle (lancet).  A device that holds the lancet (lancing device).  A journal or log book to write down your results. Procedure  Wash your hands with soap and water. Alcohol is not preferred.  Prick the side of your finger (not the tip) with the lancet.  Gently milk the finger until a small drop of blood appears.  Follow the  instructions that come with your meter for inserting the test strip, applying blood to the strip, and using your blood glucose meter. Other Areas to Get Blood for Testing Some meters allow you to use other areas of your body (other than your finger) to test your blood. These areas are called alternative sites. The most common alternative sites are:  The forearm.  The thigh.  The back area of the lower leg.  The palm of the hand. The blood flow in these areas is slower. Therefore, the blood glucose values you get may be delayed,  and the numbers are different from what you would get from your fingers. Do not use alternative sites if you think you are having hypoglycemia. Your reading will not be accurate. Always use a finger if you are having hypoglycemia. Also, if you cannot feel your lows (hypoglycemia unawareness), always use your fingers for your blood glucose checks. ADDITIONAL TIPS FOR GLUCOSE MONITORING  Do not reuse lancets.  Always carry your supplies with you.  All blood glucose meters have a 24-hour "hotline" number to call if you have questions or need help.  Adjust (calibrate) your blood glucose meter with a control solution after finishing a few boxes of strips. BLOOD GLUCOSE RECORD KEEPING It is a good idea to keep a daily record or log of your blood glucose readings. Most glucose meters, if not all, keep your glucose records stored in the meter. Some meters come with the ability to download your records to your home computer. Keeping a record of your blood glucose readings is especially helpful if you are wanting to look for patterns. Make notes to go along with the blood glucose readings because you might forget what happened at that exact time. Keeping good records helps you and your health care provider to work together to achieve good diabetes management.    This information is not intended to replace advice given to you by your health care provider. Make sure you discuss any questions you have with your health care provider.   Document Released: 04/01/2003 Document Revised: 04/19/2014 Document Reviewed: 08/21/2012 Elsevier Interactive Patient Education 2016 [employer]   Cutaneous Candidiasis Cutaneous candidiasis is a condition in which there is an overgrowth of yeast (candida) on the skin. Yeast normally live on the skin, but in small enough numbers not to cause any symptoms. In certain cases, increased growth of the yeast may cause an actual yeast infection. This kind of infection usually occurs  in areas of the skin that are constantly warm and moist, such as the armpits or the groin. Yeast is the most common cause of diaper rash in babies and in people who cannot control their bowel movements (incontinence). CAUSES  The fungus that most often causes cutaneous candidiasis is Candida albicans. Conditions that can increase the risk of getting a yeast infection of the skin include:  Obesity.  Pregnancy.  Diabetes.  Taking antibiotic medicine.  Taking birth control pills.  Taking steroid medicines.  Thyroid disease.  An iron or zinc deficiency.  Problems with the immune system. SYMPTOMS   Red, swollen area of the skin.  Bumps on the skin.  Itchiness. DIAGNOSIS  The diagnosis of cutaneous candidiasis is usually based on its appearance. Light scrapings of the skin may also be taken and viewed under a microscope to identify the presence of yeast. TREATMENT  Antifungal creams may be applied to the infected skin. In severe cases, oral medicines may be needed.  HOME CARE INSTRUCTIONS  Keep your skin clean and dry.  Maintain a healthy weight.  If you have diabetes, keep your blood sugar under control. SEEK IMMEDIATE MEDICAL CARE IF:  Your rash continues to spread despite treatment.  You have a fever, chills, or abdominal pain.   This information is not intended to replace advice given to you by your health care provider. Make sure you discuss any questions you have with your health care provider.   Document Released: 12/15/2010 Document Revised: 06/21/2011 Document Reviewed: 09/30/2014 Elsevier Interactive Patient Education 2016 [employer] Inc.   Urinary Tract Infection Urinary tract infections (UTIs) can develop anywhere along your urinary tract. Your urinary tract is your body's drainage system for removing wastes and extra water. Your urinary tract includes two kidneys, two ureters, a bladder, and a urethra. Your kidneys are a pair of bean-shaped organs. Each  kidney is about the size of your fist. They are located below your ribs, one on each side of your spine. CAUSES Infections are caused by microbes, which are microscopic organisms, including fungi, viruses, and bacteria. These organisms are so small that they can only be seen through a microscope. Bacteria are the microbes that most commonly cause UTIs. SYMPTOMS  Symptoms of UTIs may vary by age and gender of the patient and by the location of the infection. Symptoms in young women typically include a frequent and intense urge to urinate and a painful, burning feeling in the bladder or urethra during urination. Older women and men are more likely to be tired, shaky, and weak and have muscle aches and abdominal pain. A fever may mean the infection is in your kidneys. Other symptoms of a kidney infection include pain in your back or sides below the ribs, nausea, and vomiting. DIAGNOSIS To diagnose a UTI, your caregiver will ask you about your symptoms. Your caregiver will also ask you to provide a urine sample. The urine sample will be tested for bacteria and white blood cells. White blood cells are made by your body to help fight infection. TREATMENT  Typically, UTIs can be treated with medication. Because most UTIs are caused by a bacterial infection, they usually can be treated with the use of antibiotics. The choice of antibiotic and length of treatment depend on your symptoms and the type of bacteria causing your infection. HOME CARE INSTRUCTIONS  If you were prescribed antibiotics, take them exactly as your caregiver instructs you. Finish the medication even if you feel better after you have only taken some of the medication.  Drink enough water and fluids to keep your urine clear or pale yellow.  Avoid caffeine, tea, and carbonated beverages. They tend to irritate your bladder.  Empty your bladder often. Avoid holding urine for long periods of time.  Empty your bladder before and after sexual  intercourse.  After a bowel movement, women should cleanse from front to back. Use each tissue only once. SEEK MEDICAL CARE IF:   You have back pain.  You develop a fever.  Your symptoms do not begin to resolve within 3 days. SEEK IMMEDIATE MEDICAL CARE IF:   You have severe back pain or lower abdominal pain.  You develop chills.  You have nausea or vomiting.  You have continued burning or discomfort with urination. MAKE SURE YOU:   Understand these instructions.  Will watch your condition.  Will get help right away if you are not doing well or get worse.   This information is not intended to replace advice given to you by  your health care provider. Make sure you discuss any questions you have with your health care provider.   Document Released: 01/06/2005 Document Revised: 12/18/2014 Document Reviewed: 05/07/2011 Elsevier Interactive Patient Education [employer].

## 2015-10-12 LAB — COMPREHENSIVE METABOLIC PANEL
ALT: 35 U/L — ABNORMAL HIGH (ref 6–29)
AST: 22 U/L (ref 10–30)
Albumin: 4.3 g/dL (ref 3.6–5.1)
Alkaline Phosphatase: 57 U/L (ref 33–115)
BUN: 10 mg/dL (ref 7–25)
CHLORIDE: 103 mmol/L (ref 98–110)
CO2: 25 mmol/L (ref 20–31)
CREATININE: 0.69 mg/dL (ref 0.50–1.10)
Calcium: 9.3 mg/dL (ref 8.6–10.2)
Glucose, Bld: 117 mg/dL — ABNORMAL HIGH (ref 65–99)
Potassium: 4.9 mmol/L (ref 3.5–5.3)
SODIUM: 141 mmol/L (ref 135–146)
Total Bilirubin: 0.3 mg/dL (ref 0.2–1.2)
Total Protein: 6.9 g/dL (ref 6.1–8.1)

## 2015-10-12 LAB — THYROID PANEL WITH TSH
FREE THYROXINE INDEX: 2.1 (ref 1.4–3.8)
T3 UPTAKE: 25 % (ref 22–35)
T4, Total: 8.4 ug/dL (ref 4.5–12.0)
TSH: 2.02 m[IU]/L

## 2015-10-14 LAB — URINE CULTURE

## 2015-10-16 ENCOUNTER — Other Ambulatory Visit: Payer: Self-pay

## 2015-10-16 MED ORDER — FLUCONAZOLE 150 MG PO TABS: 150.0000 mg | ORAL_TABLET | ORAL | Status: DC

## 2015-10-16 NOTE — Telephone Encounter (Signed)
PA needed for Fluconazole 150mg  tabs. Unable to verify with Covermymeds nor [employer]. Spoke with pt - on $4 list with [facility]

## 2015-12-26 ENCOUNTER — Ambulatory Visit (INDEPENDENT_AMBULATORY_CARE_PROVIDER_SITE_OTHER): Payer: Managed Care, Other (non HMO)

## 2015-12-26 ENCOUNTER — Encounter: Payer: Self-pay | Admitting: Family Medicine

## 2015-12-26 ENCOUNTER — Ambulatory Visit (INDEPENDENT_AMBULATORY_CARE_PROVIDER_SITE_OTHER): Payer: Managed Care, Other (non HMO) | Admitting: Family Medicine

## 2015-12-26 VITALS — BP 129/86 | HR 103 | Temp 98.1°F | Resp 16 | Ht 64.5 in | Wt 308.0 lb

## 2015-12-26 DIAGNOSIS — F332 Major depressive disorder, recurrent severe without psychotic features: Secondary | ICD-10-CM

## 2015-12-26 DIAGNOSIS — E118 Type 2 diabetes mellitus with unspecified complications: Secondary | ICD-10-CM

## 2015-12-26 DIAGNOSIS — J0191 Acute recurrent sinusitis, unspecified: Secondary | ICD-10-CM

## 2015-12-26 DIAGNOSIS — G894 Chronic pain syndrome: Secondary | ICD-10-CM

## 2015-12-26 DIAGNOSIS — F4310 Post-traumatic stress disorder, unspecified: Secondary | ICD-10-CM | POA: Diagnosis not present

## 2015-12-26 DIAGNOSIS — M797 Fibromyalgia: Secondary | ICD-10-CM

## 2015-12-26 DIAGNOSIS — M545 Low back pain, unspecified: Secondary | ICD-10-CM

## 2015-12-26 DIAGNOSIS — M5412 Radiculopathy, cervical region: Secondary | ICD-10-CM | POA: Diagnosis not present

## 2015-12-26 DIAGNOSIS — Z6841 Body Mass Index (BMI) 40.0 and over, adult: Secondary | ICD-10-CM | POA: Diagnosis not present

## 2015-12-26 DIAGNOSIS — R7989 Other specified abnormal findings of blood chemistry: Secondary | ICD-10-CM

## 2015-12-26 DIAGNOSIS — M79604 Pain in right leg: Secondary | ICD-10-CM

## 2015-12-26 DIAGNOSIS — N390 Urinary tract infection, site not specified: Secondary | ICD-10-CM

## 2015-12-26 LAB — POCT CBC
Granulocyte percent: 68.9 %G (ref 37–80)
HEMATOCRIT: 36.4 % — AB (ref 37.7–47.9)
HEMOGLOBIN: 12.6 g/dL (ref 12.2–16.2)
Lymph, poc: 1.8 (ref 0.6–3.4)
MCH, POC: 29.8 pg (ref 27–31.2)
MCHC: 34.6 g/dL (ref 31.8–35.4)
MCV: 86 fL (ref 80–97)
MID (cbc): 0.4 (ref 0–0.9)
MPV: 6.9 fL (ref 0–99.8)
PLATELET COUNT, POC: 274 10*3/uL (ref 142–424)
POC GRANULOCYTE: 4.9 (ref 2–6.9)
POC LYMPH PERCENT: 25.9 %L (ref 10–50)
POC MID %: 5.2 %M (ref 0–12)
RBC: 4.23 M/uL (ref 4.04–5.48)
RDW, POC: 14.1 %
WBC: 7.1 10*3/uL (ref 4.6–10.2)

## 2015-12-26 LAB — POC MICROSCOPIC URINALYSIS (UMFC): Mucus: ABSENT

## 2015-12-26 LAB — POCT URINALYSIS DIP (MANUAL ENTRY)
BILIRUBIN UA: NEGATIVE
Bilirubin, UA: NEGATIVE
Blood, UA: NEGATIVE
GLUCOSE UA: NEGATIVE
LEUKOCYTES UA: NEGATIVE
Nitrite, UA: NEGATIVE
PH UA: 5
PROTEIN UA: NEGATIVE
Urobilinogen, UA: 0.2

## 2015-12-26 LAB — COMPREHENSIVE METABOLIC PANEL
ALBUMIN: 4 g/dL (ref 3.6–5.1)
ALT: 31 U/L — ABNORMAL HIGH (ref 6–29)
AST: 18 U/L (ref 10–30)
Alkaline Phosphatase: 47 U/L (ref 33–115)
BILIRUBIN TOTAL: 0.3 mg/dL (ref 0.2–1.2)
BUN: 10 mg/dL (ref 7–25)
CO2: 24 mmol/L (ref 20–31)
CREATININE: 0.69 mg/dL (ref 0.50–1.10)
Calcium: 8.7 mg/dL (ref 8.6–10.2)
Chloride: 102 mmol/L (ref 98–110)
GLUCOSE: 122 mg/dL — AB (ref 65–99)
Potassium: 4.2 mmol/L (ref 3.5–5.3)
SODIUM: 139 mmol/L (ref 135–146)
Total Protein: 6.4 g/dL (ref 6.1–8.1)

## 2015-12-26 LAB — POCT SEDIMENTATION RATE: POCT SED RATE: 33 mm/h — AB (ref 0–22)

## 2015-12-26 LAB — POCT GLYCOSYLATED HEMOGLOBIN (HGB A1C): Hemoglobin A1C: 7.1

## 2015-12-26 MED ORDER — EXENATIDE ER 2 MG ~~LOC~~ PEN: 2.0000 mg | PEN_INJECTOR | SUBCUTANEOUS | 1 refills | Status: DC

## 2015-12-26 MED ORDER — OXYCODONE-ACETAMINOPHEN 5-325 MG PO TABS: 1.0000 | ORAL_TABLET | Freq: Three times a day (TID) | ORAL | 0 refills | Status: DC | PRN

## 2015-12-26 MED ORDER — TRAZODONE HCL 50 MG PO TABS: 50.0000 mg | ORAL_TABLET | Freq: Every evening | ORAL | 0 refills | Status: DC | PRN

## 2015-12-26 MED ORDER — KETOPROFEN 75 MG PO CAPS: 75.0000 mg | ORAL_CAPSULE | Freq: Every day | ORAL | 1 refills | Status: DC | PRN

## 2015-12-26 MED ORDER — ALBUTEROL SULFATE HFA 108 (90 BASE) MCG/ACT IN AERS: 1.0000 | INHALATION_SPRAY | RESPIRATORY_TRACT | 3 refills | Status: DC | PRN

## 2015-12-26 MED ORDER — METFORMIN HCL 500 MG PO TABS: 500.0000 mg | ORAL_TABLET | Freq: Two times a day (BID) | ORAL | 1 refills | Status: DC

## 2015-12-26 NOTE — Progress Notes (Deleted)
l °
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
## 2015-12-26 NOTE — Patient Instructions (Addendum)
   IF you received an x-ray today, you will receive an invoice from [facility]. Please contact [facility] at [phone] with questions or concerns regarding your invoice.   IF you received labwork today, you will receive an invoice from [facility]. Please contact [facility] at [phone] with questions or concerns regarding your invoice.   Our billing staff will not be able to assist you with questions regarding bills from these companies.  You will be contacted with the lab results as soon as they are available. The fastest way to get your results is to activate your My Chart account. Instructions are located on the last page of this paperwork. If you have not heard from us regarding the results in 2 weeks, please contact this office.    Myofascial Pain Syndrome and Fibromyalgia Myofascial pain syndrome and fibromyalgia are both pain disorders. This pain may be felt mainly in your muscles.   Myofascial pain syndrome:  Always has trigger points or tender points in the muscle that will cause pain when pressed. The pain may come and go.  Usually affects your neck, upper back, and shoulder areas. The pain often radiates into your arms and hands.  Fibromyalgia:  Has muscle pains and tenderness that come and go.  Is often associated with fatigue and sleep disturbances.  Has trigger points.  Tends to be long-lasting (chronic), but is not life-threatening. Fibromyalgia and myofascial pain are not the same. However, they often occur together. If you have both conditions, each can make the other worse. Both are common and can cause enough pain and fatigue to make day-to-day activities difficult.  CAUSES  The exact causes of fibromyalgia and myofascial pain are not known. People with certain gene types may be more likely to develop fibromyalgia. Some factors can be triggers for both conditions, such as:   Spine  disorders.  Arthritis.  Severe injury (trauma) and other physical stressors.  Being under a lot of stress.  A medical illness. SIGNS AND SYMPTOMS  Fibromyalgia The main symptom of fibromyalgia is widespread pain and tenderness in your muscles. This can vary over time. Pain is sometimes described as stabbing, shooting, or burning. You may have tingling or numbness, too. You may also have sleep problems and fatigue. You may wake up feeling tired and groggy (fibro fog). Other symptoms may include:   Bowel and bladder problems.  Headaches.  Visual problems.  Problems with odors and noises.  Depression or mood changes.  Painful menstrual periods (dysmenorrhea).  Dry skin or eyes. Myofascial pain syndrome Symptoms of myofascial pain syndrome include:   Tight, ropy bands of muscle.   Uncomfortable sensations in muscular areas, such as:  Aching.  Cramping.  Burning.  Numbness.  Tingling.   Muscle weakness.  Trouble moving certain muscles freely (range of motion). DIAGNOSIS  There are no specific tests to diagnose fibromyalgia or myofascial pain syndrome. Both can be hard to diagnose because their symptoms are common in many other conditions. Your health care provider may suspect one or both of these conditions based on your symptoms and medical history. Your health care provider will also do a physical exam.  The key to diagnosing fibromyalgia is having pain, fatigue, and other symptoms for more than three months that cannot be explained by another condition.  The key to diagnosing myofascial pain syndrome is finding trigger points in muscles that are tender and cause pain elsewhere in your body (referred pain). TREATMENT  Treating fibromyalgia and myofascial pain   often requires a team of health care providers. This usually starts with your primary provider and a physical therapist. You may also find it helpful to work with alternative health care providers, such as  massage therapists or acupuncturists. Treatment for fibromyalgia may include medicines. This may include nonsteroidal anti-inflammatory drugs (NSAIDs), along with other medicines.  Treatment for myofascial pain may also include:  NSAIDs.  Cooling and stretching of muscles.  Trigger point injections.  Sound wave (ultrasound) treatments to stimulate muscles. HOME CARE INSTRUCTIONS   Take medicines only as directed by your health care provider.  Exercise as directed by your health care provider or physical therapist.  Try to avoid stressful situations.  Practice relaxation techniques to control your stress. You may want to try:  Biofeedback.  Visual imagery.  Hypnosis.  Muscle relaxation.  Yoga.  Meditation.  Talk to your health care provider about alternative treatments, such as acupuncture or massage treatment.  Maintain a healthy lifestyle. This includes eating a healthy diet and getting enough sleep.  Consider joining a support group.  Do not do activities that stress or strain your muscles. That includes repetitive motions and heavy lifting. SEEK MEDICAL CARE IF:   You have new symptoms.  Your symptoms get worse.  You have side effects from your medicines.  You have trouble sleeping.  Your condition is causing depression or anxiety. FOR MORE INFORMATION   National Fibromyalgia Association: http://www.fmaware.orgwww.fmaware.org  Arthritis Foundation: http://www.arthritis.orgwww.arthritis.org  American Chronic Pain Association: http://www.theacpa.org/condition/myofascial-painwww.theacpa.org/condition/myofascial-pain   This information is not intended to replace advice given to you by your health care provider. Make sure you discuss any questions you have with your health care provider.   Document Released: 03/29/2005 Document Revised: 04/19/2014 Document Reviewed: 01/02/2014 Elsevier Interactive Patient Education 2016 [employer]vier Inc.  

## 2015-12-27 ENCOUNTER — Other Ambulatory Visit: Payer: Self-pay | Admitting: *Deleted

## 2015-12-27 LAB — C-REACTIVE PROTEIN: CRP: 21.6 mg/L — ABNORMAL HIGH (ref <8.0)

## 2015-12-27 MED ORDER — EXENATIDE ER 2 MG ~~LOC~~ PEN: 2.0000 mg | PEN_INJECTOR | SUBCUTANEOUS | 1 refills | Status: DC

## 2015-12-29 ENCOUNTER — Telehealth: Payer: Self-pay

## 2015-12-29 NOTE — Telephone Encounter (Signed)
Pt is needing to talk with somone about the refills on her meds that she was given on Friday on the 15th.  She has cancelled the [facility] and she needs to talk to someone about the changes to the pharmacy   Best number [phone]

## 2016-01-05 ENCOUNTER — Other Ambulatory Visit: Payer: Self-pay

## 2016-01-06 ENCOUNTER — Other Ambulatory Visit (INDEPENDENT_AMBULATORY_CARE_PROVIDER_SITE_OTHER): Payer: Managed Care, Other (non HMO) | Admitting: Family Medicine

## 2016-01-06 DIAGNOSIS — M5412 Radiculopathy, cervical region: Secondary | ICD-10-CM

## 2016-01-06 DIAGNOSIS — M545 Low back pain: Secondary | ICD-10-CM

## 2016-01-06 DIAGNOSIS — G894 Chronic pain syndrome: Secondary | ICD-10-CM | POA: Diagnosis not present

## 2016-01-06 DIAGNOSIS — M79604 Pain in right leg: Secondary | ICD-10-CM

## 2016-01-06 DIAGNOSIS — R7989 Other specified abnormal findings of blood chemistry: Secondary | ICD-10-CM

## 2016-01-06 LAB — CBC WITH DIFFERENTIAL/PLATELET
BASOS ABS: 0 {cells}/uL (ref 0–200)
Basophils Relative: 0 %
EOS PCT: 3 %
Eosinophils Absolute: 213 cells/uL (ref 15–500)
HCT: 40.1 % (ref 35.0–45.0)
HEMOGLOBIN: 13.3 g/dL (ref 11.7–15.5)
LYMPHS PCT: 27 %
Lymphs Abs: 1917 cells/uL (ref 850–3900)
MCH: 28.7 pg (ref 27.0–33.0)
MCHC: 33.2 g/dL (ref 32.0–36.0)
MCV: 86.4 fL (ref 80.0–100.0)
MONOS PCT: 6 %
MPV: 9.5 fL (ref 7.5–12.5)
Monocytes Absolute: 426 cells/uL (ref 200–950)
NEUTROS PCT: 64 %
Neutro Abs: 4544 cells/uL (ref 1500–7800)
Platelets: 325 10*3/uL (ref 140–400)
RBC: 4.64 MIL/uL (ref 3.80–5.10)
RDW: 14.4 % (ref 11.0–15.0)
WBC: 7.1 10*3/uL (ref 3.8–10.8)

## 2016-01-06 NOTE — Telephone Encounter (Signed)
Called pt to see if this has been settled as it is not in my current inbasket, but don't see that it was taken care of from documentation. Pt stated that the problem was between [facility] mail order and trying to get [facility] [address]. She stated she doesn't need anything from us except to take [facility] off her pharm list which I have done.

## 2016-01-07 LAB — CYCLIC CITRUL PEPTIDE ANTIBODY, IGG

## 2016-01-07 LAB — RHEUMATOID FACTOR: Rhuematoid fact SerPl-aCnc: <14 IU/mL (ref <14)

## 2016-01-07 LAB — C-REACTIVE PROTEIN: CRP: 21 mg/L — AB (ref <8.0)

## 2016-01-07 LAB — SEDIMENTATION RATE: SED RATE: 26 mm/h — AB (ref 0–20)

## 2016-01-08 LAB — ANA: ANA: NEGATIVE

## 2016-01-11 ENCOUNTER — Encounter: Payer: Self-pay | Admitting: Family Medicine

## 2016-01-14 ENCOUNTER — Other Ambulatory Visit: Payer: Self-pay

## 2016-01-20 ENCOUNTER — Other Ambulatory Visit: Payer: Self-pay

## 2016-01-20 MED ORDER — CYCLOBENZAPRINE HCL 10 MG PO TABS: 10.0000 mg | ORAL_TABLET | Freq: Every day | ORAL | 1 refills | Status: DC

## 2016-01-27 ENCOUNTER — Other Ambulatory Visit: Payer: Self-pay | Admitting: Family Medicine

## 2016-01-27 ENCOUNTER — Ambulatory Visit
Admission: RE | Admit: 2016-01-27 | Discharge: 2016-01-27 | Disposition: A | Discharge status: Home / Self Care | Payer: Managed Care, Other (non HMO) | Source: Ambulatory Visit | Attending: Family Medicine | Admitting: Family Medicine

## 2016-01-27 DIAGNOSIS — G9520 Unspecified cord compression: Secondary | ICD-10-CM

## 2016-01-27 DIAGNOSIS — M4802 Spinal stenosis, cervical region: Secondary | ICD-10-CM

## 2016-01-27 DIAGNOSIS — M5412 Radiculopathy, cervical region: Secondary | ICD-10-CM

## 2016-01-27 DIAGNOSIS — M545 Low back pain: Secondary | ICD-10-CM

## 2016-01-27 DIAGNOSIS — M79605 Pain in left leg: Secondary | ICD-10-CM

## 2016-01-27 MED ORDER — GADOBENATE DIMEGLUMINE 529 MG/ML IV SOLN
20.0000 mL | Freq: Once | INTRAVENOUS | Status: AC | PRN
  Administered 2016-01-27: 20 mL via INTRAVENOUS

## 2016-01-28 ENCOUNTER — Telehealth: Payer: Self-pay

## 2016-01-28 NOTE — Telephone Encounter (Signed)
error 

## 2016-01-29 ENCOUNTER — Other Ambulatory Visit: Payer: Self-pay | Admitting: Family Medicine

## 2016-01-29 DIAGNOSIS — G9520 Unspecified cord compression: Secondary | ICD-10-CM

## 2016-03-23 ENCOUNTER — Ambulatory Visit (INDEPENDENT_AMBULATORY_CARE_PROVIDER_SITE_OTHER): Payer: Managed Care, Other (non HMO) | Admitting: Family Medicine

## 2016-03-23 VITALS — BP 128/80 | HR 119 | Temp 99.1°F | Resp 17 | Ht 65.0 in | Wt 299.0 lb

## 2016-03-23 DIAGNOSIS — J019 Acute sinusitis, unspecified: Secondary | ICD-10-CM | POA: Diagnosis not present

## 2016-03-23 MED ORDER — AMOXICILLIN-POT CLAVULANATE 875-125 MG PO TABS: 1.0000 | ORAL_TABLET | Freq: Two times a day (BID) | ORAL | 0 refills | Status: DC

## 2016-03-23 MED ORDER — FLUCONAZOLE 150 MG PO TABS: 150.0000 mg | ORAL_TABLET | Freq: Once | ORAL | 0 refills | Status: AC

## 2016-03-23 MED ORDER — HYDROCOD POLST-CPM POLST ER 10-8 MG/5ML PO SUER: 5.0000 mL | Freq: Two times a day (BID) | ORAL | 0 refills | Status: DC | PRN

## 2016-03-23 NOTE — Patient Instructions (Addendum)
   IF you received an x-ray today, you will receive an invoice from [facility]. Please contact  Radiology at [phone] with questions or concerns regarding your invoice.   IF you received labwork today, you will receive an invoice from [facility]. Please contact [facility] at [phone] with questions or concerns regarding your invoice.   Our billing staff will not be able to assist you with questions regarding bills from these companies.  You will be contacted with the lab results as soon as they are available. The fastest way to get your results is to activate your My Chart account. Instructions are located on the last page of this paperwork. If you have not heard from us regarding the results in 2 weeks, please contact this office.      Sinusitis, Adult Sinusitis is soreness and inflammation of your sinuses. Sinuses are hollow spaces in the bones around your face. Your sinuses are located:  Around your eyes.  In the middle of your forehead.  Behind your nose.  In your cheekbones. Your sinuses and nasal passages are lined with a stringy fluid (mucus). Mucus normally drains out of your sinuses. When your nasal tissues become inflamed or swollen, the mucus can become trapped or blocked so air cannot flow through your sinuses. This allows bacteria, viruses, and funguses to grow, which leads to infection. Sinusitis can develop quickly and last for 7?10 days (acute) or for more than 12 weeks (chronic). Sinusitis often develops after a cold. What are the causes? This condition is caused by anything that creates swelling in the sinuses or stops mucus from draining, including:  Allergies.  Asthma.  Bacterial or viral infection.  Abnormally shaped bones between the nasal passages.  Nasal growths that contain mucus (nasal polyps).  Narrow sinus openings.  Pollutants, such as chemicals or irritants in the air.  A foreign object  stuck in the nose.  A fungal infection. This is rare. What increases the risk? The following factors may make you more likely to develop this condition:  Having allergies or asthma.  Having had a recent cold or respiratory tract infection.  Having structural deformities or blockages in your nose or sinuses.  Having a weak immune system.  Doing a lot of swimming or diving.  Overusing nasal sprays.  Smoking. What are the signs or symptoms? The main symptoms of this condition are pain and a feeling of pressure around the affected sinuses. Other symptoms include:  Upper toothache.  Earache.  Headache.  Bad breath.  Decreased sense of smell and taste.  A cough that may get worse at night.  Fatigue.  Fever.  Thick drainage from your nose. The drainage is often green and it may contain pus (purulent).  Stuffy nose or congestion.  Postnasal drip. This is when extra mucus collects in the throat or back of the nose.  Swelling and warmth over the affected sinuses.  Sore throat.  Sensitivity to light. How is this diagnosed? This condition is diagnosed based on symptoms, a medical history, and a physical exam. To find out if your condition is acute or chronic, your health care provider may:  Look in your nose for signs of nasal polyps.  Tap over the affected sinus to check for signs of infection.  View the inside of your sinuses using an imaging device that has a light attached (endoscope). If your health care provider suspects that you have chronic sinusitis, you may also:  Be tested for allergies.  Have   a sample of mucus taken from your nose (nasal culture) and checked for bacteria.  Have a mucus sample examined to see if your sinusitis is related to an allergy. If your sinusitis does not respond to treatment and it lasts longer than 8 weeks, you may have an MRI or CT scan to check your sinuses. These scans also help to determine how severe your infection is. In  rare cases, a bone biopsy may be done to rule out more serious types of fungal sinus disease. How is this treated? Treatment for sinusitis depends on the cause and whether your condition is chronic or acute. If a virus is causing your sinusitis, your symptoms will go away on their own within 10 days. You may be given medicines to relieve your symptoms, including:  Topical nasal decongestants. They shrink swollen nasal passages and let mucus drain from your sinuses.  Antihistamines. These drugs block inflammation that is triggered by allergies. This can help to ease swelling in your nose and sinuses.  Topical nasal corticosteroids. These are nasal sprays that ease inflammation and swelling in your nose and sinuses.  Nasal saline washes. These rinses can help to get rid of thick mucus in your nose. If your condition is caused by bacteria, you will be given an antibiotic medicine. If your condition is caused by a fungus, you will be given an antifungal medicine. Surgery may be needed to correct underlying conditions, such as narrow nasal passages. Surgery may also be needed to remove polyps. Follow these instructions at home: Medicines  Take, use, or apply over-the-counter and prescription medicines only as told by your health care provider. These may include nasal sprays.  If you were prescribed an antibiotic medicine, take it as told by your health care provider. Do not stop taking the antibiotic even if you start to feel better. Hydrate and Humidify  Drink enough water to keep your urine clear or pale yellow. Staying hydrated will help to thin your mucus.  Use a cool mist humidifier to keep the humidity level in your home above 50%.  Inhale steam for 10-15 minutes, 3-4 times a day or as told by your health care provider. You can do this in the bathroom while a hot shower is running.  Limit your exposure to cool or dry air. Rest  Rest as much as possible.  Sleep with your head raised  (elevated).  Make sure to get enough sleep each night. General instructions  Apply a warm, moist washcloth to your face 3-4 times a day or as told by your health care provider. This will help with discomfort.  Wash your hands often with soap and water to reduce your exposure to viruses and other germs. If soap and water are not available, use hand sanitizer.  Do not smoke. Avoid being around people who are smoking (secondhand smoke).  Keep all follow-up visits as told by your health care provider. This is important. Contact a health care provider if:  You have a fever.  Your symptoms get worse.  Your symptoms do not improve within 10 days. Get help right away if:  You have a severe headache.  You have persistent vomiting.  You have pain or swelling around your face or eyes.  You have vision problems.  You develop confusion.  Your neck is stiff.  You have trouble breathing. This information is not intended to replace advice given to you by your health care provider. Make sure you discuss any questions you have with your   health care provider. Document Released: 03/29/2005 Document Revised: 11/23/2015 Document Reviewed: 01/22/2015 Elsevier Interactive Patient Education  2017 [employer]  
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
## 2016-04-18 ENCOUNTER — Other Ambulatory Visit: Payer: Self-pay | Admitting: Family Medicine

## 2016-04-19 NOTE — Telephone Encounter (Signed)
SS refill req Cymbalta

## 2016-04-20 ENCOUNTER — Ambulatory Visit (INDEPENDENT_AMBULATORY_CARE_PROVIDER_SITE_OTHER): Payer: Managed Care, Other (non HMO) | Admitting: Family Medicine

## 2016-04-20 VITALS — BP 115/78 | HR 112 | Temp 98.3°F | Ht 65.0 in | Wt 300.4 lb

## 2016-04-20 DIAGNOSIS — E119 Type 2 diabetes mellitus without complications: Secondary | ICD-10-CM

## 2016-04-20 DIAGNOSIS — Z23 Encounter for immunization: Secondary | ICD-10-CM | POA: Diagnosis not present

## 2016-04-20 DIAGNOSIS — R21 Rash and other nonspecific skin eruption: Secondary | ICD-10-CM | POA: Diagnosis not present

## 2016-04-20 DIAGNOSIS — J209 Acute bronchitis, unspecified: Secondary | ICD-10-CM

## 2016-04-20 DIAGNOSIS — R05 Cough: Secondary | ICD-10-CM | POA: Diagnosis not present

## 2016-04-20 DIAGNOSIS — R059 Cough, unspecified: Secondary | ICD-10-CM

## 2016-04-20 LAB — POCT INFLUENZA A/B
INFLUENZA A, POC: NEGATIVE
Influenza B, POC: NEGATIVE

## 2016-04-20 LAB — POCT SKIN KOH: Skin KOH, POC: NEGATIVE

## 2016-04-20 MED ORDER — BENZONATATE 200 MG PO CAPS: 200.0000 mg | ORAL_CAPSULE | Freq: Three times a day (TID) | ORAL | 0 refills | Status: DC | PRN

## 2016-04-20 MED ORDER — ALBUTEROL SULFATE HFA 108 (90 BASE) MCG/ACT IN AERS: 1.0000 | INHALATION_SPRAY | RESPIRATORY_TRACT | 3 refills | Status: AC | PRN

## 2016-04-20 MED ORDER — HYDROCOD POLST-CPM POLST ER 10-8 MG/5ML PO SUER: 5.0000 mL | Freq: Two times a day (BID) | ORAL | 0 refills | Status: DC | PRN

## 2016-04-20 MED ORDER — IPRATROPIUM BROMIDE 0.02 % IN SOLN
0.5000 mg | Freq: Once | RESPIRATORY_TRACT | Status: AC
  Administered 2016-04-20: 0.5 mg via RESPIRATORY_TRACT

## 2016-04-20 MED ORDER — ALBUTEROL SULFATE (2.5 MG/3ML) 0.083% IN NEBU
2.5000 mg | INHALATION_SOLUTION | Freq: Once | RESPIRATORY_TRACT | Status: AC
  Administered 2016-04-20: 2.5 mg via RESPIRATORY_TRACT

## 2016-04-20 NOTE — Patient Instructions (Addendum)
Keep your albuterol on at least 4 times a day. Use it every 4 hours while awake. You can use Mucinex DM twice a day in addition to the Tessalon for nondrowsy cough control. Alternatively it should be fine to use the Tussionex and Tessalon to fully suppress her cough when you can rest. I suspect this is viral which means you should start to see some M improvement by Friday and I would expect you to feel back to normal by Monday though of course the cough can last for an additional 3 weeks but you should feel fine and the cough should respond to antitussives.  We should recheck your hemoglobin A1c and follow-up on some of your chronic medications sometime within the next several months. I'll look for to see me for this.  Acute Bronchitis, Adult Acute bronchitis is sudden (acute) swelling of the air tubes (bronchi) in the lungs. Acute bronchitis causes these tubes to fill with mucus, which can make it hard to breathe. It can also cause coughing or wheezing. In adults, acute bronchitis usually goes away within 2 weeks. A cough caused by bronchitis may last up to 3 weeks. Smoking, allergies, and asthma can make the condition worse. Repeated episodes of bronchitis may cause further lung problems, such as chronic obstructive pulmonary disease (COPD). What are the causes? This condition can be caused by germs and by substances that irritate the lungs, including:  Cold and flu viruses. This condition is most often caused by the same virus that causes a cold.  Bacteria.  Exposure to tobacco smoke, dust, fumes, and air pollution. What increases the risk? This condition is more likely to develop in people who:  Have close contact with someone with acute bronchitis.  Are exposed to lung irritants, such as tobacco smoke, dust, fumes, and vapors.  Have a weak immune system.  Have a respiratory condition such as asthma. What are the signs or symptoms? Symptoms of this condition include:  A  cough.  Coughing up clear, yellow, or green mucus.  Wheezing.  Chest congestion.  Shortness of breath.  A fever.  Body aches.  Chills.  A sore throat. How is this diagnosed? This condition is usually diagnosed with a physical exam. During the exam, your health care provider may order tests, such as chest X-rays, to rule out other conditions. He or she may also:  Test a sample of your mucus for bacterial infection.  Check the level of oxygen in your blood. This is done to check for pneumonia.  Do a chest X-ray or lung function testing to rule out pneumonia and other conditions.  Perform blood tests. Your health care provider will also ask about your symptoms and medical history. How is this treated? Most cases of acute bronchitis clear up over time without treatment. Your health care provider may recommend:  Drinking more fluids. Drinking more makes your mucus thinner, which may make it easier to breathe.  Taking a medicine for a fever or cough.  Taking an antibiotic medicine.  Using an inhaler to help improve shortness of breath and to control a cough.  Using a cool mist vaporizer or humidifier to make it easier to breathe. Follow these instructions at home: Medicines  Take over-the-counter and prescription medicines only as told by your health care provider.  If you were prescribed an antibiotic, take it as told by your health care provider. Do not stop taking the antibiotic even if you start to feel better. General instructions  Get plenty of  rest.  Drink enough fluids to keep your urine clear or pale yellow.  Avoid smoking and secondhand smoke. Exposure to cigarette smoke or irritating chemicals will make bronchitis worse. If you smoke and you need help quitting, ask your health care provider. Quitting smoking will help your lungs heal faster.  Use an inhaler, cool mist vaporizer, or humidifier as told by your health care provider.  Keep all follow-up visits  as told by your health care provider. This is important. How is this prevented? To lower your risk of getting this condition again:  Wash your hands often with soap and water. If soap and water are not available, use hand sanitizer.  Avoid contact with people who have cold symptoms.  Try not to touch your hands to your mouth, nose, or eyes.  Make sure to get the flu shot every year. Contact a health care provider if:  Your symptoms do not improve in 2 weeks of treatment. Get help right away if:  You cough up blood.  You have chest pain.  You have severe shortness of breath.  You become dehydrated.  You faint or keep feeling like you are going to faint.  You keep vomiting.  You have a severe headache.  Your fever or chills gets worse. This information is not intended to replace advice given to you by your health care provider. Make sure you discuss any questions you have with your health care provider. Document Released: 05/06/2004 Document Revised: 10/22/2015 Document Reviewed: 09/17/2015 Elsevier Interactive Patient Education  2017 [employer]
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
## 2016-04-23 ENCOUNTER — Telehealth: Payer: Self-pay

## 2016-04-23 NOTE — Telephone Encounter (Signed)
Pt has questions about the injection she gave her self she states that more  Than a drop of blood came out and she is not sure if the medication came out as well  Best number [phone]

## 2016-04-26 NOTE — Telephone Encounter (Signed)
Pt states she hit a capillary and med went in . No worry, no bleeding or bruising or concerns

## 2016-05-10 ENCOUNTER — Encounter: Payer: Self-pay | Admitting: Family Medicine

## 2016-05-10 ENCOUNTER — Ambulatory Visit (INDEPENDENT_AMBULATORY_CARE_PROVIDER_SITE_OTHER): Payer: Managed Care, Other (non HMO) | Admitting: Family Medicine

## 2016-05-10 VITALS — BP 128/84 | HR 103 | Temp 98.4°F | Resp 16 | Ht 65.0 in | Wt 298.0 lb

## 2016-05-10 DIAGNOSIS — E6609 Other obesity due to excess calories: Secondary | ICD-10-CM | POA: Diagnosis not present

## 2016-05-10 DIAGNOSIS — J0191 Acute recurrent sinusitis, unspecified: Secondary | ICD-10-CM | POA: Diagnosis not present

## 2016-05-10 DIAGNOSIS — H6501 Acute serous otitis media, right ear: Secondary | ICD-10-CM | POA: Diagnosis not present

## 2016-05-10 DIAGNOSIS — Z6841 Body Mass Index (BMI) 40.0 and over, adult: Secondary | ICD-10-CM | POA: Diagnosis not present

## 2016-05-10 DIAGNOSIS — K582 Mixed irritable bowel syndrome: Secondary | ICD-10-CM

## 2016-05-10 DIAGNOSIS — IMO0001 Reserved for inherently not codable concepts without codable children: Secondary | ICD-10-CM

## 2016-05-10 MED ORDER — FLUCONAZOLE 150 MG PO TABS: 150.0000 mg | ORAL_TABLET | Freq: Once | ORAL | 1 refills | Status: AC

## 2016-05-10 MED ORDER — LEVOFLOXACIN 500 MG PO TABS: 500.0000 mg | ORAL_TABLET | Freq: Every day | ORAL | 0 refills | Status: DC
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
## 2016-05-10 NOTE — Patient Instructions (Addendum)
   IF you received an x-ray today, you will receive an invoice from [facility]. Please contact  Radiology at [phone] with questions or concerns regarding your invoice.   IF you received labwork today, you will receive an invoice from [facility]. Please contact [facility] at [phone] with questions or concerns regarding your invoice.   Our billing staff will not be able to assist you with questions regarding bills from these companies.  You will be contacted with the lab results as soon as they are available. The fastest way to get your results is to activate your My Chart account. Instructions are located on the last page of this paperwork. If you have not heard from us regarding the results in 2 weeks, please contact this office.      Sinusitis, Adult Sinusitis is soreness and inflammation of your sinuses. Sinuses are hollow spaces in the bones around your face. Your sinuses are located:  Around your eyes.  In the middle of your forehead.  Behind your nose.  In your cheekbones. Your sinuses and nasal passages are lined with a stringy fluid (mucus). Mucus normally drains out of your sinuses. When your nasal tissues become inflamed or swollen, the mucus can become trapped or blocked so air cannot flow through your sinuses. This allows bacteria, viruses, and funguses to grow, which leads to infection. Sinusitis can develop quickly and last for 7?10 days (acute) or for more than 12 weeks (chronic). Sinusitis often develops after a cold. What are the causes? This condition is caused by anything that creates swelling in the sinuses or stops mucus from draining, including:  Allergies.  Asthma.  Bacterial or viral infection.  Abnormally shaped bones between the nasal passages.  Nasal growths that contain mucus (nasal polyps).  Narrow sinus openings.  Pollutants, such as chemicals or irritants in the air.  A foreign object stuck in the nose.  A fungal  infection. This is rare. What increases the risk? The following factors may make you more likely to develop this condition:  Having allergies or asthma.  Having had a recent cold or respiratory tract infection.  Having structural deformities or blockages in your nose or sinuses.  Having a weak immune system.  Doing a lot of swimming or diving.  Overusing nasal sprays.  Smoking. What are the signs or symptoms? The main symptoms of this condition are pain and a feeling of pressure around the affected sinuses. Other symptoms include:  Upper toothache.  Earache.  Headache.  Bad breath.  Decreased sense of smell and taste.  A cough that may get worse at night.  Fatigue.  Fever.  Thick drainage from your nose. The drainage is often green and it may contain pus (purulent).  Stuffy nose or congestion.  Postnasal drip. This is when extra mucus collects in the throat or back of the nose.  Swelling and warmth over the affected sinuses.  Sore throat.  Sensitivity to light. How is this diagnosed? This condition is diagnosed based on symptoms, a medical history, and a physical exam. To find out if your condition is acute or chronic, your health care provider may:  Look in your nose for signs of nasal polyps.  Tap over the affected sinus to check for signs of infection.  View the inside of your sinuses using an imaging device that has a light attached (endoscope). If your health care provider suspects that you have chronic sinusitis, you may also:  Be tested for allergies.  Have a sample of   mucus taken from your nose (nasal culture) and checked for bacteria.  Have a mucus sample examined to see if your sinusitis is related to an allergy. If your sinusitis does not respond to treatment and it lasts longer than 8 weeks, you may have an MRI or CT scan to check your sinuses. These scans also help to determine how severe your infection is. In rare cases, a bone biopsy may  be done to rule out more serious types of fungal sinus disease. How is this treated? Treatment for sinusitis depends on the cause and whether your condition is chronic or acute. If a virus is causing your sinusitis, your symptoms will go away on their own within 10 days. You may be given medicines to relieve your symptoms, including:  Topical nasal decongestants. They shrink swollen nasal passages and let mucus drain from your sinuses.  Antihistamines. These drugs block inflammation that is triggered by allergies. This can help to ease swelling in your nose and sinuses.  Topical nasal corticosteroids. These are nasal sprays that ease inflammation and swelling in your nose and sinuses.  Nasal saline washes. These rinses can help to get rid of thick mucus in your nose. If your condition is caused by bacteria, you will be given an antibiotic medicine. If your condition is caused by a fungus, you will be given an antifungal medicine. Surgery may be needed to correct underlying conditions, such as narrow nasal passages. Surgery may also be needed to remove polyps. Follow these instructions at home: Medicines  Take, use, or apply over-the-counter and prescription medicines only as told by your health care provider. These may include nasal sprays.  If you were prescribed an antibiotic medicine, take it as told by your health care provider. Do not stop taking the antibiotic even if you start to feel better. Hydrate and Humidify  Drink enough water to keep your urine clear or pale yellow. Staying hydrated will help to thin your mucus.  Use a cool mist humidifier to keep the humidity level in your home above 50%.  Inhale steam for 10-15 minutes, 3-4 times a day or as told by your health care provider. You can do this in the bathroom while a hot shower is running.  Limit your exposure to cool or dry air. Rest  Rest as much as possible.  Sleep with your head raised (elevated).  Make sure to get  enough sleep each night. General instructions  Apply a warm, moist washcloth to your face 3-4 times a day or as told by your health care provider. This will help with discomfort.  Wash your hands often with soap and water to reduce your exposure to viruses and other germs. If soap and water are not available, use hand sanitizer.  Do not smoke. Avoid being around people who are smoking (secondhand smoke).  Keep all follow-up visits as told by your health care provider. This is important. Contact a health care provider if:  You have a fever.  Your symptoms get worse.  Your symptoms do not improve within 10 days. Get help right away if:  You have a severe headache.  You have persistent vomiting.  You have pain or swelling around your face or eyes.  You have vision problems.  You develop confusion.  Your neck is stiff.  You have trouble breathing. This information is not intended to replace advice given to you by your health care provider. Make sure you discuss any questions you have with your health care provider.  Document Released: 03/29/2005 Document Revised: 11/23/2015 Document Reviewed: 01/22/2015 Elsevier Interactive Patient Education  2017 [employer]  Serous Otitis Media Serous otitis media is fluid in the middle ear space. This space contains the bones for hearing and air. Air in the middle ear space helps to transmit sound.  The air gets there through the eustachian tube. This tube goes from the back of the nose (nasopharynx) to the middle ear space. It keeps the pressure in the middle ear the same as the outside world. It also helps to drain fluid from the middle ear space. CAUSES  Serous otitis media occurs when the eustachian tube gets blocked. Blockage can come from:  Ear infections.  Colds and other upper respiratory infections.  Allergies.  Irritants such as cigarette smoke.  Sudden changes in air pressure (such as descending in an  airplane).  Enlarged adenoids.  A mass in the nasopharynx. During colds and upper respiratory infections, the middle ear space can become temporarily filled with fluid. This can happen after an ear infection also. Once the infection clears, the fluid will generally drain out of the ear through the eustachian tube. If it does not, then serous otitis media occurs. SIGNS AND SYMPTOMS   Hearing loss.  A feeling of fullness in the ear, without pain.  Young children may not show any symptoms but may show slight behavioral changes, such as agitation, ear pulling, or crying. DIAGNOSIS  Serous otitis media is diagnosed by an ear exam. Tests may be done to check on the movement of the eardrum. Hearing exams may also be done. TREATMENT  The fluid most often goes away without treatment. If allergy is the cause, allergy treatment may be helpful. Fluid that persists for several months may require minor surgery. A small tube is placed in the eardrum to:  Drain the fluid.  Restore the air in the middle ear space. In certain situations, antibiotic medicines are used to avoid surgery. Surgery may be done to remove enlarged adenoids (if this is the cause). HOME CARE INSTRUCTIONS   Keep children away from tobacco smoke.  Keep all follow-up visits as directed by your health care provider. SEEK MEDICAL CARE IF:   Your hearing is not better in 3 months.  Your hearing is worse.  You have ear pain.  You have drainage from the ear.  You have dizziness.  You have serous otitis media only in one ear or have any bleeding from your nose (epistaxis).  You notice a lump on your neck. MAKE SURE YOU:  Understand these instructions.   Will watch your condition.   Will get help right away if you are not doing well or get worse.  This information is not intended to replace advice given to you by your health care provider. Make sure you discuss any questions you have with your health care  provider. Document Released: 06/19/2003 Document Revised: 04/19/2014 Document Reviewed: 10/24/2012 Elsevier Interactive Patient Education  2017 [employer]

## 2016-05-17 ENCOUNTER — Other Ambulatory Visit: Payer: Self-pay | Admitting: Family Medicine

## 2016-05-23 ENCOUNTER — Other Ambulatory Visit: Payer: Self-pay | Admitting: Family Medicine

## 2016-06-19 ENCOUNTER — Telehealth: Payer: Self-pay | Admitting: Family Medicine

## 2016-06-19 NOTE — Telephone Encounter (Signed)
Pt is asking Clelia CroftShaw to give her a call.  She states that her employer is asking for FMLA Disability since the attendance policy has changed.  She states that she has multiple diagnoses that would probably qualify for FMLA and she wants to know how to proceed and has other questions regarding the process.   [phone]

## 2016-06-20 ENCOUNTER — Other Ambulatory Visit: Payer: Self-pay | Admitting: Family Medicine

## 2016-06-20 NOTE — Telephone Encounter (Signed)
Please advise. Ov? Drop off forms?

## 2016-06-21 MED ORDER — BUPROPION HCL ER (SR) 150 MG PO TB12: 150.0000 mg | ORAL_TABLET | Freq: Every day | ORAL | 1 refills | Status: DC

## 2016-06-21 MED ORDER — DULOXETINE HCL 60 MG PO CPEP: 60.0000 mg | ORAL_CAPSULE | Freq: Every day | ORAL | 1 refills | Status: DC

## 2016-06-21 NOTE — Telephone Encounter (Signed)
Called pt. She will drop off the forms.

## 2016-06-22 ENCOUNTER — Other Ambulatory Visit: Payer: Self-pay | Admitting: Family Medicine

## 2016-06-24 ENCOUNTER — Ambulatory Visit (INDEPENDENT_AMBULATORY_CARE_PROVIDER_SITE_OTHER): Payer: 59 | Admitting: Family Medicine

## 2016-06-24 ENCOUNTER — Encounter: Payer: Self-pay | Admitting: Family Medicine

## 2016-06-24 VITALS — BP 114/68 | HR 100 | Temp 98.0°F | Ht 65.0 in | Wt 304.4 lb

## 2016-06-24 DIAGNOSIS — J329 Chronic sinusitis, unspecified: Secondary | ICD-10-CM | POA: Diagnosis not present

## 2016-06-24 DIAGNOSIS — M797 Fibromyalgia: Secondary | ICD-10-CM | POA: Diagnosis not present

## 2016-06-24 DIAGNOSIS — N390 Urinary tract infection, site not specified: Secondary | ICD-10-CM | POA: Diagnosis not present

## 2016-06-24 DIAGNOSIS — Z6841 Body Mass Index (BMI) 40.0 and over, adult: Secondary | ICD-10-CM | POA: Diagnosis not present

## 2016-06-24 DIAGNOSIS — F332 Major depressive disorder, recurrent severe without psychotic features: Secondary | ICD-10-CM | POA: Diagnosis not present

## 2016-06-24 DIAGNOSIS — F4310 Post-traumatic stress disorder, unspecified: Secondary | ICD-10-CM

## 2016-06-24 DIAGNOSIS — E118 Type 2 diabetes mellitus with unspecified complications: Secondary | ICD-10-CM

## 2016-06-24 LAB — POCT URINALYSIS DIP (MANUAL ENTRY)
Bilirubin, UA: NEGATIVE
Blood, UA: NEGATIVE
Glucose, UA: NEGATIVE
Leukocytes, UA: NEGATIVE
Nitrite, UA: NEGATIVE
PH UA: 5 (ref 5.0–8.0)
PROTEIN UA: NEGATIVE
SPEC GRAV UA: 1.025 (ref 1.030–1.035)
UROBILINOGEN UA: 0.2 (ref ?–2.0)

## 2016-06-24 LAB — POCT GLYCOSYLATED HEMOGLOBIN (HGB A1C): HEMOGLOBIN A1C: 7.2

## 2016-06-24 MED ORDER — METFORMIN HCL 500 MG PO TABS: 500.0000 mg | ORAL_TABLET | Freq: Two times a day (BID) | ORAL | 1 refills | Status: DC

## 2016-06-24 NOTE — Patient Instructions (Signed)
     IF you received an x-ray today, you will receive an invoice from [facility]. Please contact [facility] at [phone] with questions or concerns regarding your invoice.   IF you received labwork today, you will receive an invoice from [facility]. Please contact [facility] at [phone] with questions or concerns regarding your invoice.   Our billing staff will not be able to assist you with questions regarding bills from these companies.  You will be contacted with the lab results as soon as they are available. The fastest way to get your results is to activate your My Chart account. Instructions are located on the last page of this paperwork. If you have not heard from us regarding the results in 2 weeks, please contact this office.     
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
## 2016-06-25 LAB — COMPREHENSIVE METABOLIC PANEL
A/G RATIO: 1.6 (ref 1.2–2.2)
ALBUMIN: 4.2 g/dL (ref 3.5–5.5)
ALK PHOS: 67 IU/L (ref 39–117)
ALT: 29 IU/L (ref 0–32)
AST: 16 IU/L (ref 0–40)
BUN / CREAT RATIO: 17 (ref 9–23)
BUN: 12 mg/dL (ref 6–20)
Bilirubin Total: <0.2 mg/dL (ref 0.0–1.2)
CO2: 23 mmol/L (ref 18–29)
CREATININE: 0.7 mg/dL (ref 0.57–1.00)
Calcium: 9.4 mg/dL (ref 8.7–10.2)
Chloride: 102 mmol/L (ref 96–106)
GFR calc Af Amer: 130 mL/min/{1.73_m2} (ref >59)
GFR calc non Af Amer: 113 mL/min/{1.73_m2} (ref >59)
GLOBULIN, TOTAL: 2.6 g/dL (ref 1.5–4.5)
Glucose: 151 mg/dL — ABNORMAL HIGH (ref 65–99)
POTASSIUM: 4.4 mmol/L (ref 3.5–5.2)
SODIUM: 141 mmol/L (ref 134–144)
Total Protein: 6.8 g/dL (ref 6.0–8.5)

## 2016-06-25 LAB — TSH: TSH: 2.48 u[IU]/mL (ref 0.450–4.500)

## 2016-06-25 LAB — CBC
HEMATOCRIT: 40 % (ref 34.0–46.6)
HEMOGLOBIN: 12.7 g/dL (ref 11.1–15.9)
MCH: 27.8 pg (ref 26.6–33.0)
MCHC: 31.8 g/dL (ref 31.5–35.7)
MCV: 88 fL (ref 79–97)
Platelets: 324 10*3/uL (ref 150–379)
RBC: 4.57 x10E6/uL (ref 3.77–5.28)
RDW: 14.6 % (ref 12.3–15.4)
WBC: 7.5 10*3/uL (ref 3.4–10.8)

## 2016-06-25 LAB — SEDIMENTATION RATE: Sed Rate: 20 mm/hr (ref 0–32)

## 2016-06-28 ENCOUNTER — Telehealth: Payer: Self-pay | Admitting: Family Medicine

## 2016-06-28 NOTE — Telephone Encounter (Signed)
Patient needs Disability forms completed by Dr Clelia CroftShaw based off her last OV from 06/24/16. I have completed what I could and highlighted the areas I was unsure about. I will place the forms in your box on 06/28/16 please return them to the FMLA/Disability box at the 102 checkout desk within 5-7 business days. Thank you!

## 2016-07-10 ENCOUNTER — Ambulatory Visit (INDEPENDENT_AMBULATORY_CARE_PROVIDER_SITE_OTHER): Payer: 59 | Admitting: Family Medicine

## 2016-07-10 VITALS — BP 118/82 | HR 102 | Temp 98.7°F | Resp 18 | Wt 307.5 lb

## 2016-07-10 DIAGNOSIS — H6501 Acute serous otitis media, right ear: Secondary | ICD-10-CM

## 2016-07-10 MED ORDER — MONTELUKAST SODIUM 10 MG PO TABS: 10.0000 mg | ORAL_TABLET | Freq: Every day | ORAL | 3 refills | Status: DC

## 2016-07-10 MED ORDER — CLARITHROMYCIN ER 500 MG PO TB24: 1000.0000 mg | ORAL_TABLET | Freq: Every day | ORAL | 0 refills | Status: DC
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
## 2016-07-10 NOTE — Patient Instructions (Addendum)
IF you received an x-ray today, you will receive an invoice from [facility]. Please contact [facility] at [phone] with questions or concerns regarding your invoice.   IF you received labwork today, you will receive an invoice from [facility]. Please contact [facility] at [phone] with questions or concerns regarding your invoice.   Our billing staff will not be able to assist you with questions regarding bills from these companies.  You will be contacted with the lab results as soon as they are available. The fastest way to get your results is to activate your My Chart account. Instructions are located on the last page of this paperwork. If you have not heard from [name] regarding the results in 2 weeks, please contact this office.      Barotitis Media Barotitis media is inflammation of the middle ear. This condition occurs when an auditory tube (eustachian tube) is blocked in one or both ears. These tubes lead from the middle ear to the back of the nose (nasopharynx). This condition typically occurs when you experience changes in pressure, such as when flying or scuba diving. Untreated barotitis media may lead to damage or hearing loss (barotrauma), which may become permanent. What are the causes? This condition may be caused by changes in air pressure from:  Flying.  Scuba diving.  A nearby explosion. What increases the risk? The following factors may make you more likely to develop this condition:  Middle ear infection.  Sinus infection.  A cold.  Environmental allergies.  Small eustachian tubes.  Recent ear surgery. What are the signs or symptoms? Symptoms of this condition may include:  Ear pain.  Hearing loss. In severe cases, symptoms can include:  Dizziness and nausea (vertigo).  Temporary facial paralysis. How is this diagnosed? This condition is diagnosed based on:  A physical exam. Your health care provider may:  Use a device  (otoscope) to look into your ear canal and check your eardrum.  Do a test that changes air pressure in the middle ear to check how well the eardrum moves and to see if the eustachian tube is working(tympanogram).  Your medical history. In some cases, your health care provider may have you take a hearing test. You may also be referred to someone who specializes in ear treatment (otolaryngologist, "ENT"). How is this treated? This condition may be treated with:  Medicines to relieve congestion in your nose, sinus, or upper respiratory tract (decongestants).  Techniques to equalize pressure (to "pop" your ears), such as:  Yawning.  Chewing gum.  Swallowing. In severe cases, you may need surgery to relieve your symptoms or to prevent future inflammation. Follow these instructions at home:  Take over-the-counter and prescription medicines only as told by your health care provider.  Do not put anything into your ears to clean or unplug them. Ear drops will not help.  Keep all follow-up visits as told by your health care provider. This is important. How is this prevented? Using these strategies may help to prevent barotitis media:  Chewing gum with frequent, forceful swallowing during takeoff and landing when flying.  Holding your nose and gently blowing to pop your ears for equalizing pressure changes. This forces air into the eustachian tube.  Yawning during air pressure changes.  Using a nasal decongestant about 30-60 minutes before flying, if you have nasal congestion. Contact a health care provider if:  You have vertigo.  You have hearing loss.  Your symptoms do not get better or they get  worse.  You have a fever. Get help right away if:  You have a severe headache, ear pain, and dizziness.  You have balance problems.  You cannot move or feel part of your face.  You have bloody or pus-like drainage from your ears. Summary  Barotitis media is inflammation of the  middle ear.  This condition typically occurs when you experience changes in pressure, such as when flying or scuba diving.  You may be at a higher risk for this condition if you have small eustachian tubes, had recent ear surgery, or have allergies, a cold, or sinus or middle ear infection.  This condition may be treated with medicines or techniques to equalize pressure in your ears.  Strategies can be used to help prevent barotitis media. This information is not intended to replace advice given to you by your health care provider. Make sure you discuss any questions you have with your health care provider. Document Released: 03/26/2000 Document Revised: 02/16/2016 Document Reviewed: 02/16/2016 Elsevier Interactive Patient Education  2017 [employer].

## 2016-07-15 NOTE — Telephone Encounter (Signed)
Have these forms been completed, I NEVER got them back and it has been over 2 weeks now?  Can you please let me know. Thank you!

## 2016-07-20 ENCOUNTER — Other Ambulatory Visit: Payer: Self-pay | Admitting: Physician Assistant

## 2016-07-22 NOTE — Telephone Encounter (Signed)
Patient called today and left a message asking about her forms, have we completed them? I placed them in your box on 06/28/16 and I have not gotten them back. They are WAY OVER DUE. Please let me know ASAP.  Thank you

## 2016-07-25 NOTE — Telephone Encounter (Signed)
So sorry - finished and returned. And I have apologized to pt. Please make lots of copies - that was ridiculously detailed and lengthy and I am concerned that perhaps we got sent the wrong forms - these questions all seemed to be more consistent with disability insurance rather than the standard FMLA but hoping that they just use the same forms in case but maybe just in case they are wrong if you could make a copy and put in my box for me to hold onto until I see the scan come through that'd be great.

## 2016-07-26 NOTE — Telephone Encounter (Signed)
Paperwork scanned and faxed to [employer] on 07/26/16

## 2016-08-09 ENCOUNTER — Emergency Department (HOSPITAL_COMMUNITY)
Admission: EM | Admit: 2016-08-09 | Discharge: 2016-08-09 | Disposition: A | Discharge status: Home / Self Care | Payer: Managed Care, Other (non HMO) | Attending: Emergency Medicine | Admitting: Emergency Medicine

## 2016-08-09 ENCOUNTER — Encounter (HOSPITAL_COMMUNITY): Payer: Self-pay | Admitting: Emergency Medicine

## 2016-08-09 ENCOUNTER — Emergency Department (HOSPITAL_COMMUNITY): Payer: Managed Care, Other (non HMO)

## 2016-08-09 ENCOUNTER — Ambulatory Visit: Payer: 59 | Admitting: Family Medicine

## 2016-08-09 DIAGNOSIS — N23 Unspecified renal colic: Secondary | ICD-10-CM

## 2016-08-09 DIAGNOSIS — Z91040 Latex allergy status: Secondary | ICD-10-CM | POA: Diagnosis not present

## 2016-08-09 DIAGNOSIS — N132 Hydronephrosis with renal and ureteral calculous obstruction: Secondary | ICD-10-CM | POA: Diagnosis not present

## 2016-08-09 DIAGNOSIS — Z7984 Long term (current) use of oral hypoglycemic drugs: Secondary | ICD-10-CM | POA: Insufficient documentation

## 2016-08-09 DIAGNOSIS — R1031 Right lower quadrant pain: Secondary | ICD-10-CM | POA: Diagnosis present

## 2016-08-09 DIAGNOSIS — E119 Type 2 diabetes mellitus without complications: Secondary | ICD-10-CM | POA: Diagnosis not present

## 2016-08-09 HISTORY — DX: Urinary tract infection, site not specified: N39.0

## 2016-08-09 HISTORY — DX: Disorder of kidney and ureter, unspecified: N28.9

## 2016-08-09 LAB — I-STAT BETA HCG BLOOD, ED (MC, WL, AP ONLY)

## 2016-08-09 MED ORDER — KETOROLAC TROMETHAMINE 30 MG/ML IJ SOLN
30.0000 mg | Freq: Once | INTRAMUSCULAR | Status: AC
  Administered 2016-08-09: 30 mg via INTRAVENOUS
  Filled 2016-08-09: qty 1

## 2016-08-09 MED ORDER — HYDROMORPHONE HCL 1 MG/ML IJ SOLN
1.0000 mg | Freq: Once | INTRAMUSCULAR | Status: AC
  Administered 2016-08-09: 1 mg via INTRAVENOUS
  Filled 2016-08-09: qty 1

## 2016-08-09 MED ORDER — OXYCODONE-ACETAMINOPHEN 5-325 MG PO TABS: 1.0000 | ORAL_TABLET | ORAL | 0 refills | Status: DC | PRN

## 2016-08-09 MED ORDER — ONDANSETRON 8 MG PO TBDP: 8.0000 mg | ORAL_TABLET | Freq: Three times a day (TID) | ORAL | 0 refills | Status: DC | PRN

## 2016-08-09 MED ORDER — ONDANSETRON HCL 4 MG/2ML IJ SOLN
4.0000 mg | Freq: Once | INTRAMUSCULAR | Status: AC
  Administered 2016-08-09: 4 mg via INTRAVENOUS
  Filled 2016-08-09: qty 2
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
## 2016-08-09 NOTE — ED Notes (Signed)
Family at bedside. 

## 2016-08-09 NOTE — ED Triage Notes (Addendum)
Patient is complaining of right flank pain.  Radiates to front of body.  Pain started 2 hours ago.  Patient states her urine is dark brown in color, denies seeing blood in urine.    BP:132/90 HR: 94 R:18  CBG:187

## 2016-08-09 NOTE — ED Notes (Signed)
Pt ambulatory and independent at discharge.  Verbalized understanding of discharge instructions 

## 2016-08-09 NOTE — ED Notes (Signed)
ED Provider at bedside. 

## 2016-08-09 NOTE — ED Notes (Signed)
Patient transported to CT 

## 2016-08-17 ENCOUNTER — Other Ambulatory Visit: Payer: Self-pay | Admitting: Family Medicine

## 2016-08-23 ENCOUNTER — Other Ambulatory Visit: Payer: Self-pay | Admitting: Physician Assistant

## 2016-08-30 ENCOUNTER — Other Ambulatory Visit: Payer: Self-pay | Admitting: Family Medicine

## 2016-08-30 MED ORDER — FLUCONAZOLE 150 MG PO TABS: 150.0000 mg | ORAL_TABLET | Freq: Once | ORAL | 0 refills | Status: AC

## 2016-08-30 MED ORDER — FLUCONAZOLE 150 MG PO TABS: 150.0000 mg | ORAL_TABLET | Freq: Once | ORAL | 0 refills | Status: DC

## 2016-09-01 ENCOUNTER — Other Ambulatory Visit: Payer: Self-pay | Admitting: Family Medicine

## 2016-09-01 DIAGNOSIS — Z6841 Body Mass Index (BMI) 40.0 and over, adult: Principal | ICD-10-CM

## 2016-09-10 ENCOUNTER — Encounter: Payer: Self-pay | Admitting: Family Medicine

## 2016-09-10 ENCOUNTER — Ambulatory Visit (INDEPENDENT_AMBULATORY_CARE_PROVIDER_SITE_OTHER): Payer: 59 | Admitting: Family Medicine

## 2016-09-10 VITALS — BP 101/66 | HR 107 | Temp 98.8°F | Resp 17 | Ht 65.0 in | Wt 310.0 lb

## 2016-09-10 DIAGNOSIS — F332 Major depressive disorder, recurrent severe without psychotic features: Secondary | ICD-10-CM | POA: Diagnosis not present

## 2016-09-10 DIAGNOSIS — Z6841 Body Mass Index (BMI) 40.0 and over, adult: Secondary | ICD-10-CM | POA: Diagnosis not present

## 2016-09-10 MED ORDER — PHENTERMINE HCL 15 MG PO CAPS: 15.0000 mg | ORAL_CAPSULE | ORAL | 1 refills | Status: DC

## 2016-09-10 NOTE — Patient Instructions (Signed)
     IF you received an x-ray today, you will receive an invoice from [facility]. Please contact [facility] at [phone] with questions or concerns regarding your invoice.   IF you received labwork today, you will receive an invoice from [facility]. Please contact [facility] at [phone] with questions or concerns regarding your invoice.   Our billing staff will not be able to assist you with questions regarding bills from these companies.  You will be contacted with the lab results as soon as they are available. The fastest way to get your results is to activate your My Chart account. Instructions are located on the last page of this paperwork. If you have not heard from us regarding the results in 2 weeks, please contact this office.     
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
## 2016-09-11 ENCOUNTER — Telehealth: Payer: Self-pay | Admitting: Family Medicine

## 2016-09-11 NOTE — Telephone Encounter (Signed)
She states is the 15mg  1 qd and 1  At lunch prn  the most current dose?  "Did you find a cheaper way?" per pt.Marland Kitchen..Marland Kitchen

## 2016-09-11 NOTE — Telephone Encounter (Signed)
Called pt. She tried this at [facility] with good rx coupon (was around $24 I think). Next mo will try changing to phenteramine 37.5mg  tablets - can get [facility] for $9.30 with good rx coupon and then can split them in half to be about same dose as current rx.

## 2016-09-11 NOTE — Telephone Encounter (Signed)
Pt is checking on the status of the medication change for the phentermine that was given on 09/10/16   Best number [phone]

## 2016-09-13 NOTE — Telephone Encounter (Signed)
Pa request sent from local pharm for phentermine  [facility] is reviewing your PA request and will respond within 24-72 hours. To check for an update later, open this request from your dashboard. You may close this dialog and return to your dashboard to perform other tasks.

## 2016-09-21 ENCOUNTER — Other Ambulatory Visit: Payer: Self-pay | Admitting: Physician Assistant

## 2016-09-22 ENCOUNTER — Other Ambulatory Visit: Payer: Self-pay | Admitting: Family Medicine

## 2016-09-24 NOTE — Telephone Encounter (Signed)
Please advise 

## 2016-09-27 ENCOUNTER — Encounter: Payer: Self-pay | Admitting: Emergency Medicine

## 2016-09-27 ENCOUNTER — Ambulatory Visit (INDEPENDENT_AMBULATORY_CARE_PROVIDER_SITE_OTHER): Payer: 59 | Admitting: Emergency Medicine

## 2016-09-27 VITALS — BP 120/82 | HR 118 | Temp 98.6°F | Resp 18 | Ht 65.0 in | Wt 308.2 lb

## 2016-09-27 DIAGNOSIS — M62838 Other muscle spasm: Secondary | ICD-10-CM

## 2016-09-27 DIAGNOSIS — M25512 Pain in left shoulder: Secondary | ICD-10-CM

## 2016-09-27 NOTE — Patient Instructions (Addendum)
     IF you received an x-ray today, you will receive an invoice from [facility]. Please contact [facility] at [phone] with questions or concerns regarding your invoice.   IF you received labwork today, you will receive an invoice from [facility]. Please contact [facility] at [phone] with questions or concerns regarding your invoice.   Our billing staff will not be able to assist you with questions regarding bills from these companies.  You will be contacted with the lab results as soon as they are available. The fastest way to get your results is to activate your My Chart account. Instructions are located on the last page of this paperwork. If you have not heard from us regarding the results in 2 weeks, please contact this office.     Shoulder Pain Many things can cause shoulder pain, including:  An injury.  Moving the arm in the same way again and again (overuse).  Joint pain (arthritis).  Follow these instructions at home: Take these actions to help with your pain:  Squeeze a soft ball or a foam pad as much as you can. This helps to prevent swelling. It also makes the arm stronger.  Take over-the-counter and prescription medicines only as told by your doctor.  If told, put ice on the area: ? Put ice in a plastic bag. ? Place a towel between your skin and the bag. ? Leave the ice on for 20 minutes, 2-3 times per day. Stop putting on ice if it does not help with the pain.  If you were given a shoulder sling or immobilizer: ? Wear it as told. ? Remove it to shower or bathe. ? Move your arm as little as possible. ? Keep your hand moving. This helps prevent swelling.  Contact a doctor if:  Your pain gets worse.  Medicine does not help your pain.  You have new pain in your arm, hand, or fingers. Get help right away if:  Your arm, hand, or fingers: ? Tingle. ? Are numb. ? Are swollen. ? Are painful. ? Turn white or blue. This information is  not intended to replace advice given to you by your health care provider. Make sure you discuss any questions you have with your health care provider. Document Released: 09/15/2007 Document Revised: 11/23/2015 Document Reviewed: 07/22/2014 Elsevier Interactive Patient Education  2018 [employer]  
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
## 2016-09-28 ENCOUNTER — Telehealth: Payer: Self-pay | Admitting: Family Medicine

## 2016-09-28 NOTE — Telephone Encounter (Signed)
Restart flonase several days ago as she continues to have pnd and then had sinus pressure and started to feel ill and so missed the passed few d of work (thurs, fri, mon).  She no longer has any FMLA days left (is -1)  She is becoming progressively concerned about her physical health. 3d ago she had some LBP while during laundry and did 4400 steps, then the next day she diffuse muscle cramps. Then yesterday, while in the shoulder she felt severe pain in her upper back shoulder than shot all the way down into her hand. Saw Dr. Alvy BimlerSagardia who told her it was like a charley horse in her shoulder.  She took 2 percocet and a flexeril yesterday which helped. She tried to increase her diet in K and started a K and Mg supp. The shooting nerve pain is gone and pain is sig less - just muscle is sore - so going to do epsom salt bath tonight. Had diarrhea.  Stopped the phentermine for the 3d that she did not go to work as she didn't get out of bed. She did want to eat those days but not enough to get out of bed to get it.   She did take it again today.  She has a yogurt coffee drink with [employer] - "powerful drink + energy" made with greek yogurt 13g of carbs 20g of protein, 4g of fat for breakfast.  She will start 2 bananas - attributes to sweating and diarrhea.  She will drink a powerade and take K and Mg tonight. She is challenging herself to do a food diary with what she wants to eat vs what she chooses to eat and write down everything. Going to start today.  She cancelled her cruise as doesn't have enough vacation time but her family might defer it until next year when she could go to.

## 2016-10-05 ENCOUNTER — Other Ambulatory Visit: Payer: Self-pay | Admitting: Family Medicine

## 2016-10-09 ENCOUNTER — Encounter: Payer: Self-pay | Admitting: Family Medicine

## 2016-10-09 ENCOUNTER — Ambulatory Visit (INDEPENDENT_AMBULATORY_CARE_PROVIDER_SITE_OTHER): Payer: 59 | Admitting: Family Medicine

## 2016-10-09 VITALS — BP 120/85 | HR 94 | Temp 98.5°F | Resp 16 | Ht 65.0 in | Wt 309.0 lb

## 2016-10-09 DIAGNOSIS — E11649 Type 2 diabetes mellitus with hypoglycemia without coma: Secondary | ICD-10-CM

## 2016-10-09 DIAGNOSIS — G894 Chronic pain syndrome: Secondary | ICD-10-CM | POA: Diagnosis not present

## 2016-10-09 DIAGNOSIS — E282 Polycystic ovarian syndrome: Secondary | ICD-10-CM

## 2016-10-09 DIAGNOSIS — Z6841 Body Mass Index (BMI) 40.0 and over, adult: Secondary | ICD-10-CM | POA: Diagnosis not present

## 2016-10-09 LAB — POCT GLYCOSYLATED HEMOGLOBIN (HGB A1C): HEMOGLOBIN A1C: 7.6
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
## 2016-10-09 NOTE — Patient Instructions (Signed)
     IF you received an x-ray today, you will receive an invoice from [facility]. Please contact [facility] at [phone] with questions or concerns regarding your invoice.   IF you received labwork today, you will receive an invoice from [facility]. Please contact [facility] at [phone] with questions or concerns regarding your invoice.   Our billing staff will not be able to assist you with questions regarding bills from these companies.  You will be contacted with the lab results as soon as they are available. The fastest way to get your results is to activate your My Chart account. Instructions are located on the last page of this paperwork. If you have not heard from us regarding the results in 2 weeks, please contact this office.     

## 2016-10-10 LAB — COMPREHENSIVE METABOLIC PANEL
A/G RATIO: 1.9 (ref 1.2–2.2)
ALK PHOS: 67 IU/L (ref 39–117)
ALT: 28 IU/L (ref 0–32)
AST: 17 IU/L (ref 0–40)
Albumin: 4.3 g/dL (ref 3.5–5.5)
BUN/Creatinine Ratio: 14 (ref 9–23)
BUN: 11 mg/dL (ref 6–20)
CHLORIDE: 102 mmol/L (ref 96–106)
CO2: 22 mmol/L (ref 20–29)
Calcium: 9 mg/dL (ref 8.7–10.2)
Creatinine, Ser: 0.76 mg/dL (ref 0.57–1.00)
GFR calc Af Amer: 117 mL/min/{1.73_m2} (ref >59)
GFR calc non Af Amer: 101 mL/min/{1.73_m2} (ref >59)
Globulin, Total: 2.3 g/dL (ref 1.5–4.5)
Glucose: 135 mg/dL — ABNORMAL HIGH (ref 65–99)
POTASSIUM: 4.8 mmol/L (ref 3.5–5.2)
SODIUM: 142 mmol/L (ref 134–144)
Total Protein: 6.6 g/dL (ref 6.0–8.5)

## 2016-10-10 LAB — LIPID PANEL
CHOL/HDL RATIO: 3.4 ratio (ref 0.0–4.4)
Cholesterol, Total: 168 mg/dL (ref 100–199)
HDL: 49 mg/dL (ref >39)
LDL CALC: 82 mg/dL (ref 0–99)
TRIGLYCERIDES: 184 mg/dL — AB (ref 0–149)
VLDL CHOLESTEROL CAL: 37 mg/dL (ref 5–40)

## 2016-10-10 LAB — MICROALBUMIN / CREATININE URINE RATIO
CREATININE, UR: 101.3 mg/dL
MICROALBUM., U, RANDOM: 14.5 ug/mL
Microalb/Creat Ratio: 14.3 mg/g creat (ref 0.0–30.0)

## 2016-10-11 NOTE — Telephone Encounter (Signed)
According to OV on 6/30 patient has started her phentermine

## 2016-10-27 ENCOUNTER — Telehealth: Payer: Self-pay | Admitting: Family Medicine

## 2016-10-27 DIAGNOSIS — M25512 Pain in left shoulder: Secondary | ICD-10-CM

## 2016-10-27 DIAGNOSIS — G8929 Other chronic pain: Secondary | ICD-10-CM

## 2016-10-27 DIAGNOSIS — M797 Fibromyalgia: Secondary | ICD-10-CM

## 2016-10-27 DIAGNOSIS — M545 Low back pain: Secondary | ICD-10-CM

## 2016-10-27 NOTE — Telephone Encounter (Signed)
Please advise. dg 

## 2016-10-27 NOTE — Telephone Encounter (Signed)
PATIENT STATES SHE SAW DR. SAGARDIA ON 09/27/16 FOR MUSCLE SPASMS IN HER (L) SHOULDER. HE SAID AT THAT TIME THAT IT MIGHT BE A GOOD IDEA TO HAVE SOME PHYSICAL THERAPY. SHE ALSO SAW DR. SHAW WHO IS HER PCP ON 10/09/16. SHE WOULD LIKE TO ASK DR. SHAW IF SHE WOULD WRITE ON A PRESCRIPTON FOR HER TO HAVE P.T. AT [facility]. HER INSURANCE TOLD HER THEY WOULD COVER IT IF IT WAS DONE THIS WAY. PLEASE FAX TO THEM AT [phone] PATIENT PHONE [phone] (CELL) MBC

## 2016-10-27 NOTE — Telephone Encounter (Signed)
Referral placed.

## 2016-10-28 NOTE — Telephone Encounter (Signed)
Sent to [facility] on 7/19. Pt advised.

## 2016-11-05 ENCOUNTER — Other Ambulatory Visit: Payer: Self-pay | Admitting: Family Medicine

## 2016-11-17 ENCOUNTER — Other Ambulatory Visit: Payer: Self-pay | Admitting: Family Medicine

## 2016-11-23 ENCOUNTER — Encounter (INDEPENDENT_AMBULATORY_CARE_PROVIDER_SITE_OTHER): Payer: 59

## 2016-11-27 ENCOUNTER — Encounter: Payer: Self-pay | Admitting: Family Medicine

## 2016-11-27 ENCOUNTER — Ambulatory Visit (INDEPENDENT_AMBULATORY_CARE_PROVIDER_SITE_OTHER): Payer: 59 | Admitting: Family Medicine

## 2016-11-27 VITALS — BP 113/79 | HR 95 | Temp 98.0°F | Resp 16 | Ht 65.16 in | Wt 310.0 lb

## 2016-11-27 DIAGNOSIS — F418 Other specified anxiety disorders: Secondary | ICD-10-CM

## 2016-11-27 DIAGNOSIS — R197 Diarrhea, unspecified: Secondary | ICD-10-CM

## 2016-11-27 DIAGNOSIS — G894 Chronic pain syndrome: Secondary | ICD-10-CM | POA: Diagnosis not present

## 2016-11-27 DIAGNOSIS — K582 Mixed irritable bowel syndrome: Secondary | ICD-10-CM | POA: Diagnosis not present

## 2016-11-27 DIAGNOSIS — K6289 Other specified diseases of anus and rectum: Secondary | ICD-10-CM | POA: Diagnosis not present

## 2016-11-27 DIAGNOSIS — M797 Fibromyalgia: Secondary | ICD-10-CM | POA: Diagnosis not present

## 2016-11-27 DIAGNOSIS — E118 Type 2 diabetes mellitus with unspecified complications: Secondary | ICD-10-CM

## 2016-11-27 DIAGNOSIS — R399 Unspecified symptoms and signs involving the genitourinary system: Secondary | ICD-10-CM | POA: Diagnosis not present

## 2016-11-27 DIAGNOSIS — E282 Polycystic ovarian syndrome: Secondary | ICD-10-CM

## 2016-11-27 DIAGNOSIS — Z23 Encounter for immunization: Secondary | ICD-10-CM

## 2016-11-27 LAB — POCT WET + KOH PREP
TRICH BY WET PREP: ABSENT
YEAST BY KOH: ABSENT
Yeast by wet prep: ABSENT

## 2016-11-27 LAB — POCT URINALYSIS DIP (MANUAL ENTRY)
Bilirubin, UA: NEGATIVE
Glucose, UA: NEGATIVE mg/dL
Ketones, POC UA: NEGATIVE mg/dL
Leukocytes, UA: NEGATIVE
NITRITE UA: NEGATIVE
PH UA: 5.5 (ref 5.0–8.0)
Protein Ur, POC: NEGATIVE mg/dL
Urobilinogen, UA: 0.2 E.U./dL

## 2016-11-27 LAB — POC MICROSCOPIC URINALYSIS (UMFC): Mucus: ABSENT

## 2016-11-27 MED ORDER — TRAZODONE HCL 50 MG PO TABS: ORAL_TABLET | ORAL | 0 refills | Status: DC
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
## 2016-11-27 NOTE — Patient Instructions (Addendum)
IF you received an x-ray today, you will receive an invoice from [facility]. Please contact [facility] at [phone] with questions or concerns regarding your invoice.   IF you received labwork today, you will receive an invoice from [facility]. Please contact [facility] at [phone] with questions or concerns regarding your invoice.   Our billing staff will not be able to assist you with questions regarding bills from these companies.  You will be contacted with the lab results as soon as they are available. The fastest way to get your results is to activate your My Chart account. Instructions are located on the last page of this paperwork. If you have not heard from [name] regarding the results in 2 weeks, please contact this office.     Diet for Irritable Bowel Syndrome When you have irritable bowel syndrome (IBS), the foods you eat and your eating habits are very important. IBS may cause various symptoms, such as abdominal pain, constipation, or diarrhea. Choosing the right foods can help ease discomfort caused by these symptoms. Work with your health care provider and dietitian to find the best eating plan to help control your symptoms. What general guidelines do I need to follow?  Keep a food diary. This will help you identify foods that cause symptoms. Write down: ? What you eat and when. ? What symptoms you have. ? When symptoms occur in relation to your meals.  Avoid foods that cause symptoms. Talk with your dietitian about other ways to get the same nutrients that are in these foods.  Eat more foods that contain fiber. Take a fiber supplement if directed by your dietitian.  Eat your meals slowly, in a relaxed setting.  Aim to eat 5-6 small meals per day. Do not skip meals.  Drink enough fluids to keep your urine clear or pale yellow.  Ask your health care provider if you should take an over-the-counter probiotic during flare-ups to help restore healthy  gut bacteria.  If you have cramping or diarrhea, try making your meals low in fat and high in carbohydrates. Examples of carbohydrates are pasta, rice, whole grain breads and cereals, fruits, and vegetables.  If dairy products cause your symptoms to flare up, try eating less of them. You might be able to handle yogurt better than other dairy products because it contains bacteria that help with digestion. What foods are not recommended? The following are some foods and drinks that may worsen your symptoms:  Fatty foods, such as [place] fries.  Milk products, such as cheese or ice cream.  Chocolate.  Alcohol.  Products with caffeine, such as coffee.  Carbonated drinks, such as soda.  The items listed above may not be a complete list of foods and beverages to avoid. Contact your dietitian for more information. What foods are good sources of fiber? Your health care provider or dietitian may recommend that you eat more foods that contain fiber. Fiber can help reduce constipation and other IBS symptoms. Add foods with fiber to your diet a little at a time so that your body can get used to them. Too much fiber at once might cause gas and swelling of your abdomen. The following are some foods that are good sources of fiber:  Apples.  Peaches.  Pears.  Berries.  Figs.  Broccoli (raw).  Cabbage.  Carrots.  Raw peas.  Kidney beans.  Lima beans.  Whole grain bread.  Whole grain cereal.  Where to find more information: [employer] for  Functional Gastrointestinal Disorders: www.iffgd.[employer] of Diabetes and Digestive and Kidney Diseases: http://norris-lawson.com/.aspx This information is not intended to replace advice given to you by your health care provider. Make sure you discuss any questions you have with your health care provider. Document Released: 06/19/2003 Document Revised:  09/04/2015 Document Reviewed: 06/29/2013 Elsevier Interactive Patient Education  2018 [employer] Inc.  Irritable Bowel Syndrome, Adult Irritable bowel syndrome (IBS) is not one specific disease. It is a group of symptoms that affects the organs responsible for digestion (gastrointestinal or GI tract). To regulate how your GI tract works, your body sends signals back and forth between your intestines and your brain. If you have IBS, there may be a problem with these signals. As a result, your GI tract does not function normally. Your intestines may become more sensitive and overreact to certain things. This is especially true when you eat certain foods or when you are under stress. There are four types of IBS. These may be determined based on the consistency of your stool:  IBS with diarrhea.  IBS with constipation.  Mixed IBS.  Unsubtyped IBS.  It is important to know which type of IBS you have. Some treatments are more likely to be helpful for certain types of IBS. What are the causes? The exact cause of IBS is not known. What increases the risk? You may have a higher risk of IBS if:  You are a woman.  You are younger than 36 years old.  You have a family history of IBS.  You have mental health problems.  You have had bacterial infection of your GI tract.  What are the signs or symptoms? Symptoms of IBS vary from person to person. The main symptom is abdominal pain or discomfort. Additional symptoms usually include one or more of the following:  Diarrhea, constipation, or both.  Abdominal swelling or bloating.  Feeling full or sick after eating a small or regular-size meal.  Frequent gas.  Mucus in the stool.  A feeling of having more stool left after a bowel movement.  Symptoms tend to come and go. They may be associated with stress, psychiatric conditions, or nothing at all. How is this diagnosed? There is no specific test to diagnose IBS. Your health care provider  will make a diagnosis based on a physical exam, medical history, and your symptoms. You may have other tests to rule out other conditions that may be causing your symptoms. These may include:  Blood tests.  X-rays.  CT scan.  Endoscopy and colonoscopy. This is a test in which your GI tract is viewed with a long, thin, flexible tube.  How is this treated? There is no cure for IBS, but treatment can help relieve symptoms. IBS treatment often includes:  Changes to your diet, such as: ? Eating more fiber. ? Avoiding foods that cause symptoms. ? Drinking more water. ? Eating regular, medium-sized portioned meals.  Medicines. These may include: ? Fiber supplements if you have constipation. ? Medicine to control diarrhea (antidiarrheal medicines). ? Medicine to help control muscle spasms in your GI tract (antispasmodic medicines). ? Medicines to help with any mental health issues, such as antidepressants or tranquilizers.  Therapy. ? Talk therapy may help with anxiety, depression, or other mental health issues that can make IBS symptoms worse.  Stress reduction. ? Managing your stress can help keep symptoms under control.  Follow these instructions at home:  Take medicines only as directed by your health care provider.  Eat a healthy  diet. ? Avoid foods and drinks with added sugar. ? Include more whole grains, fruits, and vegetables gradually into your diet. This may be especially helpful if you have IBS with constipation. ? Avoid any foods and drinks that make your symptoms worse. These may include dairy products and caffeinated or carbonated drinks. ? Do not eat large meals. ? Drink enough fluid to keep your urine clear or pale yellow.  Exercise regularly. Ask your health care provider for recommendations of good activities for you.  Keep all follow-up visits as directed by your health care provider. This is important. Contact a health care provider if:  You have constant  pain.  You have trouble or pain with swallowing.  You have worsening diarrhea. Get help right away if:  You have severe and worsening abdominal pain.  You have diarrhea and: ? You have a rash, stiff neck, or severe headache. ? You are irritable, sleepy, or difficult to awaken. ? You are weak, dizzy, or extremely thirsty.  You have bright red blood in your stool or you have black tarry stools.  You have unusual abdominal swelling that is painful.  You vomit continuously.  You vomit blood (hematemesis).  You have both abdominal pain and a fever. This information is not intended to replace advice given to you by your health care provider. Make sure you discuss any questions you have with your health care provider. Document Released: 03/29/2005 Document Revised: 08/29/2015 Document Reviewed: 12/14/2013 Elsevier Interactive Patient Education  2018 [employer].

## 2016-11-28 LAB — URINE CULTURE: ORGANISM ID, BACTERIA: NO GROWTH

## 2016-12-09 ENCOUNTER — Encounter (INDEPENDENT_AMBULATORY_CARE_PROVIDER_SITE_OTHER): Payer: Self-pay | Admitting: Family Medicine

## 2016-12-09 ENCOUNTER — Ambulatory Visit (INDEPENDENT_AMBULATORY_CARE_PROVIDER_SITE_OTHER): Payer: 59 | Admitting: Family Medicine

## 2016-12-09 VITALS — BP 116/78 | HR 87 | Temp 97.9°F | Ht 64.0 in | Wt 307.0 lb

## 2016-12-09 DIAGNOSIS — Z1331 Encounter for screening for depression: Secondary | ICD-10-CM

## 2016-12-09 DIAGNOSIS — Z0289 Encounter for other administrative examinations: Secondary | ICD-10-CM

## 2016-12-09 DIAGNOSIS — E669 Obesity, unspecified: Secondary | ICD-10-CM | POA: Diagnosis not present

## 2016-12-09 DIAGNOSIS — Z6841 Body Mass Index (BMI) 40.0 and over, adult: Secondary | ICD-10-CM | POA: Diagnosis not present

## 2016-12-09 DIAGNOSIS — F418 Other specified anxiety disorders: Secondary | ICD-10-CM | POA: Diagnosis not present

## 2016-12-09 DIAGNOSIS — R0609 Other forms of dyspnea: Secondary | ICD-10-CM | POA: Diagnosis not present

## 2016-12-09 DIAGNOSIS — Z1389 Encounter for screening for other disorder: Secondary | ICD-10-CM

## 2016-12-09 DIAGNOSIS — IMO0001 Reserved for inherently not codable concepts without codable children: Secondary | ICD-10-CM

## 2016-12-09 DIAGNOSIS — Z9189 Other specified personal risk factors, not elsewhere classified: Secondary | ICD-10-CM | POA: Diagnosis not present

## 2016-12-09 DIAGNOSIS — R5383 Other fatigue: Secondary | ICD-10-CM

## 2016-12-09 DIAGNOSIS — E119 Type 2 diabetes mellitus without complications: Secondary | ICD-10-CM
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
## 2016-12-10 LAB — CBC WITH DIFFERENTIAL
BASOS ABS: 0 10*3/uL (ref 0.0–0.2)
BASOS: 0 %
EOS (ABSOLUTE): 0.2 10*3/uL (ref 0.0–0.4)
Eos: 2 %
Hematocrit: 38.1 % (ref 34.0–46.6)
Hemoglobin: 12.4 g/dL (ref 11.1–15.9)
Immature Grans (Abs): 0 10*3/uL (ref 0.0–0.1)
Immature Granulocytes: 0 %
Lymphocytes Absolute: 2.1 10*3/uL (ref 0.7–3.1)
Lymphs: 26 %
MCH: 28 pg (ref 26.6–33.0)
MCHC: 32.5 g/dL (ref 31.5–35.7)
MCV: 86 fL (ref 79–97)
MONOS ABS: 0.3 10*3/uL (ref 0.1–0.9)
Monocytes: 4 %
NEUTROS PCT: 68 %
Neutrophils Absolute: 5.6 10*3/uL (ref 1.4–7.0)
RBC: 4.43 x10E6/uL (ref 3.77–5.28)
RDW: 14.9 % (ref 12.3–15.4)
WBC: 8.2 10*3/uL (ref 3.4–10.8)

## 2016-12-10 LAB — COMPREHENSIVE METABOLIC PANEL
A/G RATIO: 1.4 (ref 1.2–2.2)
ALK PHOS: 66 IU/L (ref 39–117)
ALT: 24 IU/L (ref 0–32)
AST: 16 IU/L (ref 0–40)
Albumin: 3.9 g/dL (ref 3.5–5.5)
BUN / CREAT RATIO: 18 (ref 9–23)
BUN: 13 mg/dL (ref 6–20)
CO2: 23 mmol/L (ref 20–29)
CREATININE: 0.72 mg/dL (ref 0.57–1.00)
Calcium: 8.8 mg/dL (ref 8.7–10.2)
Chloride: 99 mmol/L (ref 96–106)
GFR calc Af Amer: 125 mL/min/{1.73_m2} (ref >59)
GFR calc non Af Amer: 108 mL/min/{1.73_m2} (ref >59)
GLOBULIN, TOTAL: 2.8 g/dL (ref 1.5–4.5)
Glucose: 147 mg/dL — ABNORMAL HIGH (ref 65–99)
POTASSIUM: 4.5 mmol/L (ref 3.5–5.2)
SODIUM: 138 mmol/L (ref 134–144)
Total Protein: 6.7 g/dL (ref 6.0–8.5)

## 2016-12-10 LAB — T3: T3, Total: 142 ng/dL (ref 71–180)

## 2016-12-10 LAB — FOLATE: Folate: 11.8 ng/mL (ref >3.0)

## 2016-12-10 LAB — TSH: TSH: 2.31 u[IU]/mL (ref 0.450–4.500)

## 2016-12-10 LAB — LIPID PANEL WITH LDL/HDL RATIO
Cholesterol, Total: 170 mg/dL (ref 100–199)
HDL: 47 mg/dL (ref >39)
LDL Calculated: 83 mg/dL (ref 0–99)
LDl/HDL Ratio: 1.8 ratio (ref 0.0–3.2)
Triglycerides: 198 mg/dL — ABNORMAL HIGH (ref 0–149)
VLDL Cholesterol Cal: 40 mg/dL (ref 5–40)

## 2016-12-10 LAB — VITAMIN D 25 HYDROXY (VIT D DEFICIENCY, FRACTURES): VIT D 25 HYDROXY: 23.9 ng/mL — AB (ref 30.0–100.0)

## 2016-12-10 LAB — HEMOGLOBIN A1C
ESTIMATED AVERAGE GLUCOSE: 177 mg/dL
Hgb A1c MFr Bld: 7.8 % — ABNORMAL HIGH (ref 4.8–5.6)

## 2016-12-10 LAB — VITAMIN B12: Vitamin B-12: 757 pg/mL (ref 232–1245)

## 2016-12-10 LAB — INSULIN, RANDOM: INSULIN: 56.3 u[IU]/mL — AB (ref 2.6–24.9)

## 2016-12-10 LAB — T4, FREE: FREE T4: 1.12 ng/dL (ref 0.82–1.77)

## 2016-12-23 ENCOUNTER — Ambulatory Visit (INDEPENDENT_AMBULATORY_CARE_PROVIDER_SITE_OTHER): Payer: 59 | Admitting: Family Medicine

## 2016-12-23 ENCOUNTER — Telehealth (INDEPENDENT_AMBULATORY_CARE_PROVIDER_SITE_OTHER): Payer: Self-pay | Admitting: Family Medicine

## 2016-12-23 VITALS — BP 114/77 | HR 88 | Temp 98.4°F | Ht 64.0 in | Wt 301.0 lb

## 2016-12-23 DIAGNOSIS — Z9189 Other specified personal risk factors, not elsewhere classified: Secondary | ICD-10-CM

## 2016-12-23 DIAGNOSIS — E119 Type 2 diabetes mellitus without complications: Secondary | ICD-10-CM

## 2016-12-23 DIAGNOSIS — E559 Vitamin D deficiency, unspecified: Secondary | ICD-10-CM | POA: Diagnosis not present

## 2016-12-23 MED ORDER — INSULIN PEN NEEDLE 32G X 4 MM MISC: 1.0000 | Freq: Two times a day (BID) | 0 refills | Status: DC

## 2016-12-23 MED ORDER — LIRAGLUTIDE 18 MG/3ML ~~LOC~~ SOPN: 1.2000 mg | PEN_INJECTOR | Freq: Every morning | SUBCUTANEOUS | 0 refills | Status: DC

## 2016-12-23 MED ORDER — VITAMIN D (ERGOCALCIFEROL) 1.25 MG (50000 UNIT) PO CAPS: 50000.0000 [IU] | ORAL_CAPSULE | ORAL | 0 refills | Status: DC

## 2016-12-23 NOTE — Telephone Encounter (Signed)
Ins requires more info in order to pay for medication that was prescribed today .  Pt number is [phone]

## 2016-12-27 ENCOUNTER — Encounter (INDEPENDENT_AMBULATORY_CARE_PROVIDER_SITE_OTHER): Payer: Self-pay
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
## 2016-12-27 NOTE — Telephone Encounter (Signed)
We have started the prior authorization and may take up to 7 days before we have an approval.  Hildy Nicholl R CMA

## 2017-01-05 ENCOUNTER — Encounter (INDEPENDENT_AMBULATORY_CARE_PROVIDER_SITE_OTHER): Payer: Self-pay | Admitting: Family Medicine

## 2017-01-11 ENCOUNTER — Ambulatory Visit (INDEPENDENT_AMBULATORY_CARE_PROVIDER_SITE_OTHER): Payer: 59 | Admitting: Physician Assistant

## 2017-01-11 VITALS — BP 106/72 | HR 86 | Temp 98.5°F | Ht 64.0 in | Wt 297.0 lb

## 2017-01-11 DIAGNOSIS — E119 Type 2 diabetes mellitus without complications: Secondary | ICD-10-CM

## 2017-01-11 DIAGNOSIS — Z6841 Body Mass Index (BMI) 40.0 and over, adult: Secondary | ICD-10-CM | POA: Diagnosis not present
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
## 2017-01-16 ENCOUNTER — Other Ambulatory Visit: Payer: Self-pay | Admitting: Family Medicine

## 2017-01-17 ENCOUNTER — Ambulatory Visit (INDEPENDENT_AMBULATORY_CARE_PROVIDER_SITE_OTHER): Payer: 59 | Admitting: Physician Assistant

## 2017-01-19 ENCOUNTER — Encounter (INDEPENDENT_AMBULATORY_CARE_PROVIDER_SITE_OTHER): Payer: Self-pay | Admitting: Physician Assistant

## 2017-01-27 ENCOUNTER — Ambulatory Visit (INDEPENDENT_AMBULATORY_CARE_PROVIDER_SITE_OTHER): Payer: 59 | Admitting: Physician Assistant

## 2017-01-27 VITALS — BP 132/74 | HR 91 | Temp 98.0°F | Ht 64.0 in | Wt 295.0 lb

## 2017-01-27 DIAGNOSIS — Z9189 Other specified personal risk factors, not elsewhere classified: Secondary | ICD-10-CM | POA: Diagnosis not present

## 2017-01-27 DIAGNOSIS — E559 Vitamin D deficiency, unspecified: Secondary | ICD-10-CM

## 2017-01-27 DIAGNOSIS — Z6841 Body Mass Index (BMI) 40.0 and over, adult: Secondary | ICD-10-CM | POA: Diagnosis not present

## 2017-01-27 DIAGNOSIS — E119 Type 2 diabetes mellitus without complications: Secondary | ICD-10-CM | POA: Diagnosis not present

## 2017-01-27 MED ORDER — LIRAGLUTIDE 18 MG/3ML ~~LOC~~ SOPN: 1.2000 mg | PEN_INJECTOR | Freq: Every morning | SUBCUTANEOUS | 0 refills | Status: DC

## 2017-01-27 MED ORDER — VITAMIN D (ERGOCALCIFEROL) 1.25 MG (50000 UNIT) PO CAPS: 50000.0000 [IU] | ORAL_CAPSULE | ORAL | 0 refills | Status: DC
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
## 2017-02-07 ENCOUNTER — Other Ambulatory Visit: Payer: Self-pay | Admitting: Family Medicine

## 2017-02-10 ENCOUNTER — Ambulatory Visit (INDEPENDENT_AMBULATORY_CARE_PROVIDER_SITE_OTHER): Payer: 59 | Admitting: Physician Assistant

## 2017-02-11 ENCOUNTER — Other Ambulatory Visit: Payer: Self-pay | Admitting: Family Medicine

## 2017-02-12 ENCOUNTER — Encounter: Payer: Self-pay | Admitting: Physician Assistant

## 2017-02-12 ENCOUNTER — Ambulatory Visit (INDEPENDENT_AMBULATORY_CARE_PROVIDER_SITE_OTHER): Payer: 59 | Admitting: Physician Assistant

## 2017-02-12 VITALS — BP 132/82 | HR 94 | Temp 98.3°F | Resp 18 | Ht 64.0 in | Wt 300.0 lb

## 2017-02-12 DIAGNOSIS — B9789 Other viral agents as the cause of diseases classified elsewhere: Secondary | ICD-10-CM | POA: Diagnosis not present

## 2017-02-12 DIAGNOSIS — W5503XA Scratched by cat, initial encounter: Secondary | ICD-10-CM | POA: Diagnosis not present

## 2017-02-12 DIAGNOSIS — J069 Acute upper respiratory infection, unspecified: Secondary | ICD-10-CM

## 2017-02-12 DIAGNOSIS — S50812A Abrasion of left forearm, initial encounter: Secondary | ICD-10-CM | POA: Diagnosis not present

## 2017-02-12 NOTE — Progress Notes (Signed)
Patient ID: Kerri Ford, female    DOB: 1980/07/22, 36 y.o.   MRN: [id number]  PCP: Shawnee Knapp, MD  Chief Complaint  Patient presents with  . Illness    x4 days, sore throat, coughing up mucous, and sinus pressure.  . Cat scratch     left lower arm, Pt states she was scratch by her cat on Monday and thinks it may be infected.    Subjective:   Presents for evaluation of upper respiratory illness and cat scratch.  Her father's cat scratched her 02/07/2017 while bathing it. A back claw dug in to the LEFT forearm. She's been "digging" at it. Doesn't really hurt, but there is a little redness, and she wants to make sure it's not infected. She is allergic to cats. The next day, she developed sinus pressure and drainage. Cough in the morning is productive of greenish sputum, but then non-productive the rest of the day. She nasal drainage is clear, with occasional tinges of blood. No fever, chills, nausea, vomiting. No headache or dizziness. No swollen or tender lymph nodes.   Review of Systems As above.    Patient Active Problem List   Diagnosis Date Noted  . Vitamin D deficiency 12/23/2016  . Acute pain of left shoulder 09/27/2016  . Muscle spasm of left shoulder 09/27/2016  . Class 3 obesity due to excess calories without serious comorbidity with body mass index (BMI) of 45.0 to 49.9 in adult 05/28/2016  . Fibromyalgia 09/09/2015  . Chronic pain syndrome 09/09/2015  . Severe episode of recurrent major depressive disorder, without psychotic features ([place])   . Hyperglycemia 02/20/2015  . PTSD (post-traumatic stress disorder) 07/24/2014  . Type 2 diabetes mellitus ([place]) 07/24/2014  . DDD (degenerative disc disease), lumbar 07/24/2014  . Migraine   . Morbid obesity with BMI of 45.0-49.9, adult ([place]) 05/20/2012  . Depression with anxiety 12/31/2011  . PCOS (polycystic ovarian syndrome) 08/11/2011  . Diverticulitis of sigmoid colon 10/09/2010     Prior to Admission  medications   Medication Sig Start Date End Date Taking? Authorizing Provider  acetaminophen (TYLENOL) 500 MG tablet Take 500 mg by mouth every 6 (six) hours as needed.   Yes [provider]  albuterol (PROVENTIL HFA;VENTOLIN HFA) 108 (90 Base) MCG/ACT inhaler Inhale 1-2 puffs into the lungs every 4 (four) hours as needed for wheezing or shortness of breath. Reported on 10/11/2015 04/20/16  Yes Shawnee Knapp, MD  APAP-Pamabrom-Pyrilamine (PAMPRIN MAX PAIN FORMULA) 500-25-15 MG TABS Take by mouth 4 (four) times daily as needed.    Yes [provider]  b complex vitamins tablet Take 1 tablet by mouth daily.   Yes [provider]  blood glucose meter kit and supplies KIT Dispense based on patient and insurance preference. Use up to four times daily as directed. (FOR ICD-9 250.00, 250.01). 10/11/15  Yes Shawnee Knapp, MD  buPROPion [facility] SR) 150 MG 12 hr tablet TAKE 1 TABLET(150 MG) BY MOUTH DAILY 08/20/16  Yes Shawnee Knapp, MD  clonazePAM (KLONOPIN) 0.5 MG tablet Take 0.5 mg by mouth as needed for anxiety.   Yes [provider]  cyclobenzaprine (FLEXERIL) 10 MG tablet Take 1 tablet (10 mg total) by mouth at bedtime. 09/27/16  Yes Shawnee Knapp, MD  DULoxetine (CYMBALTA) 60 MG capsule Take 1 capsule (60 mg total) by mouth at bedtime. 06/21/16  Yes Shawnee Knapp, MD  fluticasone (FLONASE) 50 MCG/ACT nasal spray Place 1 spray into both nostrils  daily.   Yes [provider]  ibuprofen (ADVIL,MOTRIN) 200 MG tablet Take 200 mg by mouth every 6 (six) hours as needed.   Yes [provider]  Insulin Pen Needle (BD PEN NEEDLE NANO U/F) 32G X 4 MM MISC 1 Package by Does not apply route 2 (two) times daily. 12/23/16  Yes Beasley, Caren D, MD  ketoprofen (ORUDIS) 75 MG capsule Take 1 capsule (75 mg total) by mouth daily as needed (headaches). 12/26/15  Yes Shawnee Knapp, MD  Lactobacillus (PROBIOTIC ACIDOPHILUS PO) Take 1 tablet by mouth daily. Reported on 10/11/2015   Yes  [provider]  levocetirizine (XYZAL) 5 MG tablet Take 1 tablet (5 mg total) by mouth every evening. 09/01/15  Yes Shawnee Knapp, MD  liraglutide 18 MG/3ML SOPN Inject 0.2 mLs (1.2 mg total) into the skin every morning. 01/27/17  Yes Lacy Duverney M, PA-C  Magnesium 400 MG TABS Take 1 tablet by mouth 3 (three) times a week.   Yes [provider]  metFORMIN (GLUCOPHAGE) 500 MG tablet TAKE 1 TABLET(500 MG) BY MOUTH TWICE DAILY WITH A MEAL 09/23/16  Yes Shawnee Knapp, MD  montelukast (SINGULAIR) 10 MG tablet TAKE 1 TABLET(10 MG) BY MOUTH AT BEDTIME 11/06/16  Yes Shawnee Knapp, MD  naproxen sodium (ANAPROX) 220 MG tablet Take 220 mg by mouth 2 (two) times daily as needed.   Yes [provider]  Nutritional Supplements (JUICE PLUS FIBRE PO) Take 3 capsules by mouth every morning.   Yes [provider]  ONE TOUCH ULTRA TEST test strip USE TO TEST FOUR TIMES DAILY 10/07/16  Yes Shawnee Knapp, MD  [facility] DELICA LANCETS FINE MISC USE UP TO FOUR TIMES DAILY AS DIRECTED 02/08/17  Yes Shawnee Knapp, MD  oxyCODONE-acetaminophen (PERCOCET/ROXICET) 5-325 MG tablet Take 1 tablet by mouth every 4 (four) hours as needed for severe pain. 08/09/16  Yes Jola Schmidt, MD  Potassium 99 MG TABS Take 1 tablet by mouth 3 (three) times a week.   Yes [provider]  traZODone (DESYREL) 50 MG tablet TAKE 1 TO 2 TABLETS BY MOUTH EVERY NIGHT AT BEDTIME AS NEEDED FOR SLEEP 11/27/16  Yes Shawnee Knapp, MD  Vitamin D, Ergocalciferol, (DRISDOL) 50000 units CAPS capsule Take 1 capsule (50,000 Units total) by mouth every 7 (seven) days. 01/27/17  Yes Lacy Duverney M, PA-C     Allergies  Allergen Reactions  . Latex Itching and Cough  . Aspartame And Phenylalanine Other (See Comments)    Inflamed esophagus       Objective:  Physical Exam  Constitutional: She is oriented to person, place, and time. She appears well-developed and well-nourished. She is active and cooperative. No distress.  BP  132/82 (BP Location: Left Arm, Patient Position: Sitting, Cuff Size: Large)   Pulse 94   Temp 98.3 F (36.8 C) (Oral)   Resp 18   Ht '5\' 4"'$  (1.626 m)   Wt 300 lb (136.1 kg)   LMP 02/09/2017   SpO2 97%   BMI 51.49 kg/m   HENT:  Head: Normocephalic and atraumatic.  Right Ear: Hearing, tympanic membrane, external ear and ear canal normal.  Left Ear: Hearing, tympanic membrane, external ear and ear canal normal.  Nose: Nose normal.  Mouth/Throat: Uvula is midline, oropharynx is clear and moist and mucous membranes are normal. No oral lesions. No uvula swelling.  Eyes: Conjunctivae are normal. No scleral icterus.  Neck: Normal range of motion. Neck supple. No thyromegaly present.  Cardiovascular: Normal rate, regular rhythm and normal heart sounds.   Pulses:      Radial pulses are 2+ on the right side, and 2+ on the left side.  Pulmonary/Chest: Effort normal and breath sounds normal.  Lymphadenopathy:       Head (right side): No tonsillar, no preauricular, no posterior auricular and no occipital adenopathy present.       Head (left side): No tonsillar, no preauricular, no posterior auricular and no occipital adenopathy present.    She has no cervical adenopathy.       Right: No supraclavicular adenopathy present.       Left: No supraclavicular adenopathy present.  Neurological: She is alert and oriented to person, place, and time. No sensory deficit.  Skin: Skin is warm, dry and intact. No rash noted. No cyanosis or erythema. Nails show no clubbing.  Psychiatric: She has a normal mood and affect. Her speech is normal and behavior is normal.           Assessment & Plan:   1. Viral URI with cough Supportive care. Use Flonase she has at home. Continue oral antihistamine (Xyzal).  2. Cat scratch of forearm, left, initial encounter Irritated, but not infected. Wash daily with soap and water. Stop picking at it. RTC if increased redness, develops pain, streaking, drainage,  swelling.    Return if symptoms worsen or fail to improve.   Fara Chute, PA-C [facility] [place]

## 2017-02-12 NOTE — Patient Instructions (Addendum)
Wash the wound daily with soap and water.  STOP PICKING AT IT!  REST. Drink at least 64 ounces of water daily. Use the Flonase. Continue the Xyzal (or other oral antihistamine).    IF you received an x-ray today, you will [facility]. Please contact [facility] at [phone] with questions or concerns regarding your invoice.   IF you received labwork today, you will receive an invoice from [facility]. Please contact [facility] at [phone] with questions or concerns regarding your invoice.   Our billing staff will not be able to assist you with questions regarding bills from these companies.  You will be contacted with the lab results as soon as they are available. The fastest way to get your results is to activate your My Chart account. Instructions are located on the last page of this paperwork. If you have not heard from us regarding the results in 2 weeks, please contact this office.

## 2017-02-17 ENCOUNTER — Ambulatory Visit (INDEPENDENT_AMBULATORY_CARE_PROVIDER_SITE_OTHER): Payer: 59 | Admitting: Physician Assistant

## 2017-02-17 VITALS — BP 118/77 | HR 82 | Temp 98.4°F | Ht 64.0 in | Wt 294.0 lb

## 2017-02-17 DIAGNOSIS — E559 Vitamin D deficiency, unspecified: Secondary | ICD-10-CM

## 2017-02-17 DIAGNOSIS — E119 Type 2 diabetes mellitus without complications: Secondary | ICD-10-CM

## 2017-02-17 DIAGNOSIS — Z6841 Body Mass Index (BMI) 40.0 and over, adult: Secondary | ICD-10-CM

## 2017-02-17 DIAGNOSIS — Z9189 Other specified personal risk factors, not elsewhere classified: Secondary | ICD-10-CM

## 2017-02-17 DIAGNOSIS — E66813 Obesity, class 3: Secondary | ICD-10-CM

## 2017-02-17 MED ORDER — LIRAGLUTIDE 18 MG/3ML ~~LOC~~ SOPN: 1.2000 mg | PEN_INJECTOR | Freq: Every morning | SUBCUTANEOUS | 0 refills | Status: DC

## 2017-02-17 MED ORDER — VITAMIN D (ERGOCALCIFEROL) 1.25 MG (50000 UNIT) PO CAPS: 50000.0000 [IU] | ORAL_CAPSULE | ORAL | 0 refills | Status: DC
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
## 2017-02-26 ENCOUNTER — Encounter: Payer: Self-pay | Admitting: Family Medicine

## 2017-02-26 ENCOUNTER — Ambulatory Visit (INDEPENDENT_AMBULATORY_CARE_PROVIDER_SITE_OTHER): Payer: 59 | Admitting: Family Medicine

## 2017-02-26 VITALS — BP 113/79 | HR 90 | Temp 97.9°F | Resp 16 | Ht 64.0 in | Wt 301.0 lb

## 2017-02-26 DIAGNOSIS — F418 Other specified anxiety disorders: Secondary | ICD-10-CM | POA: Diagnosis not present

## 2017-02-26 DIAGNOSIS — G894 Chronic pain syndrome: Secondary | ICD-10-CM

## 2017-02-26 DIAGNOSIS — J329 Chronic sinusitis, unspecified: Secondary | ICD-10-CM

## 2017-02-26 DIAGNOSIS — G43809 Other migraine, not intractable, without status migrainosus: Secondary | ICD-10-CM | POA: Diagnosis not present

## 2017-02-26 DIAGNOSIS — K582 Mixed irritable bowel syndrome: Secondary | ICD-10-CM

## 2017-02-26 DIAGNOSIS — Z23 Encounter for immunization: Secondary | ICD-10-CM

## 2017-02-26 DIAGNOSIS — G8929 Other chronic pain: Secondary | ICD-10-CM

## 2017-02-26 DIAGNOSIS — M545 Low back pain: Secondary | ICD-10-CM

## 2017-02-26 DIAGNOSIS — Z9109 Other allergy status, other than to drugs and biological substances: Secondary | ICD-10-CM

## 2017-02-26 DIAGNOSIS — M797 Fibromyalgia: Secondary | ICD-10-CM

## 2017-02-26 MED ORDER — TRAZODONE HCL 50 MG PO TABS: 100.0000 mg | ORAL_TABLET | Freq: Every day | ORAL | 1 refills | Status: DC
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
## 2017-02-26 NOTE — Patient Instructions (Signed)
     IF you received an x-ray today, you will receive an invoice from [facility]. Please contact  Radiology at [phone] with questions or concerns regarding your invoice.   IF you received labwork today, you will receive an invoice from [facility]. Please contact [facility] at [phone] with questions or concerns regarding your invoice.   Our billing staff will not be able to assist you with questions regarding bills from these companies.  You will be contacted with the lab results as soon as they are available. The fastest way to get your results is to activate your My Chart account. Instructions are located on the last page of this paperwork. If you have not heard from us regarding the results in 2 weeks, please contact this office.     

## 2017-02-28 DIAGNOSIS — Z0271 Encounter for disability determination: Secondary | ICD-10-CM

## 2017-03-01 DIAGNOSIS — M545 Low back pain, unspecified: Secondary | ICD-10-CM | POA: Insufficient documentation

## 2017-03-01 DIAGNOSIS — G8929 Other chronic pain: Secondary | ICD-10-CM | POA: Insufficient documentation

## 2017-03-01 DIAGNOSIS — K582 Mixed irritable bowel syndrome: Secondary | ICD-10-CM | POA: Insufficient documentation

## 2017-03-01 DIAGNOSIS — J329 Chronic sinusitis, unspecified: Secondary | ICD-10-CM | POA: Insufficient documentation

## 2017-03-01 DIAGNOSIS — Z9109 Other allergy status, other than to drugs and biological substances: Secondary | ICD-10-CM | POA: Insufficient documentation

## 2017-03-02 ENCOUNTER — Telehealth: Payer: Self-pay

## 2017-03-02 NOTE — Telephone Encounter (Signed)
Do you have this paperwork?  Copied from CRM [phone]. 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  Pt would like to have it ready to pick up tomorrow. Please call when ready for pickup.

## 2017-03-02 NOTE — Telephone Encounter (Signed)
I put it into the FMLA file on Saturday after clinic - she gave it to me in her visit and we completed it at that time.  No Charge please since was able to be done during visit.

## 2017-03-02 NOTE — Telephone Encounter (Signed)
Paperwork is scanned into the patient charts under Media tab as Completed FMLA forms

## 2017-03-05 ENCOUNTER — Other Ambulatory Visit: Payer: Self-pay | Admitting: Family Medicine

## 2017-03-08 ENCOUNTER — Ambulatory Visit (INDEPENDENT_AMBULATORY_CARE_PROVIDER_SITE_OTHER): Payer: 59 | Admitting: Physician Assistant

## 2017-03-08 VITALS — BP 114/74 | HR 92 | Temp 97.4°F | Ht 64.0 in | Wt 296.0 lb

## 2017-03-08 DIAGNOSIS — E559 Vitamin D deficiency, unspecified: Secondary | ICD-10-CM

## 2017-03-08 DIAGNOSIS — Z9189 Other specified personal risk factors, not elsewhere classified: Secondary | ICD-10-CM

## 2017-03-08 DIAGNOSIS — E119 Type 2 diabetes mellitus without complications: Secondary | ICD-10-CM | POA: Diagnosis not present

## 2017-03-08 DIAGNOSIS — Z6841 Body Mass Index (BMI) 40.0 and over, adult: Secondary | ICD-10-CM | POA: Diagnosis not present

## 2017-03-08 MED ORDER — INSULIN PEN NEEDLE 32G X 4 MM MISC: 1.0000 | Freq: Two times a day (BID) | 0 refills | Status: DC
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
## 2017-03-17 ENCOUNTER — Other Ambulatory Visit: Payer: Self-pay | Admitting: Family Medicine

## 2017-03-19 ENCOUNTER — Other Ambulatory Visit: Payer: Self-pay | Admitting: Family Medicine

## 2017-03-22 ENCOUNTER — Telehealth (INDEPENDENT_AMBULATORY_CARE_PROVIDER_SITE_OTHER): Payer: Self-pay

## 2017-03-22 NOTE — Telephone Encounter (Signed)
Patient had to r/s her appt due to the weather and has [facility] [address] She states she is taking 1.8mg  daily and will be out within 2 days. April, CMA

## 2017-03-22 NOTE — Addendum Note (Signed)
Addended by: Sherren MochaSHAW, [place] Tapp N on: 03/22/2017 04:15 PM   Modules accepted: Orders

## 2017-03-22 NOTE — Telephone Encounter (Signed)
[facility] at [address] not currently accepting e-rx so make sure this printed and sign or retry sending other day.

## 2017-03-23 ENCOUNTER — Encounter (INDEPENDENT_AMBULATORY_CARE_PROVIDER_SITE_OTHER): Payer: Self-pay

## 2017-03-23 ENCOUNTER — Ambulatory Visit (INDEPENDENT_AMBULATORY_CARE_PROVIDER_SITE_OTHER): Payer: 59 | Admitting: Physician Assistant

## 2017-03-23 MED ORDER — CYCLOBENZAPRINE HCL 10 MG PO TABS: ORAL_TABLET | ORAL | 1 refills | Status: DC

## 2017-03-24 ENCOUNTER — Other Ambulatory Visit: Payer: Self-pay | Admitting: Family Medicine

## 2017-03-24 DIAGNOSIS — E119 Type 2 diabetes mellitus without complications: Secondary | ICD-10-CM

## 2017-03-24 NOTE — Telephone Encounter (Signed)
Phone call to pharmacy to inquire about cyclobenzaprine prescription. Pharmacy staff states cyclobenzaprine is available for patient pickup.   Phone call to patient. Patient notified prescription is ready for pickup at [address] [facility]. Patient appreciative.

## 2017-03-24 NOTE — Telephone Encounter (Signed)
Copied from CRM [phone]. Topic: Inquiry >> Mar 24, 2017  6:33 PM Stephannie LiSimmons, Agustus Mane L, NT wrote: Reason for CRM: Patient needs a refill for victoza  injection ,was originally prescribed by Dr Dal[facility] pharmacy said this can be refilled by  her  PCP please advise , just needs enough until appointment  next week .03/30/17 call patient at  [phone]

## 2017-03-25 MED ORDER — LIRAGLUTIDE 18 MG/3ML ~~LOC~~ SOPN
1.8000 mg | PEN_INJECTOR | Freq: Every morning | SUBCUTANEOUS | 0 refills | Status: DC
Start: 2017-03-25 — End: 2017-04-23

## 2017-03-25 NOTE — Telephone Encounter (Signed)
Sent refill into pharm. Pt informed.

## 2017-03-27 ENCOUNTER — Other Ambulatory Visit: Payer: Self-pay | Admitting: Family Medicine
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
## 2017-03-29 NOTE — Telephone Encounter (Signed)
Lancets refilled on 03/05/17, no indication of Metformin refills since June, 2018. Please review need for Metformin refill. Thanks.

## 2017-03-30 ENCOUNTER — Ambulatory Visit (INDEPENDENT_AMBULATORY_CARE_PROVIDER_SITE_OTHER): Payer: 59 | Admitting: Physician Assistant

## 2017-03-30 VITALS — BP 116/78 | HR 88 | Temp 97.4°F | Ht 64.0 in | Wt 286.0 lb

## 2017-03-30 DIAGNOSIS — Z6841 Body Mass Index (BMI) 40.0 and over, adult: Secondary | ICD-10-CM

## 2017-03-30 DIAGNOSIS — E119 Type 2 diabetes mellitus without complications: Secondary | ICD-10-CM

## 2017-03-30 DIAGNOSIS — E782 Mixed hyperlipidemia: Secondary | ICD-10-CM | POA: Diagnosis not present

## 2017-03-30 DIAGNOSIS — E66813 Obesity, class 3: Secondary | ICD-10-CM

## 2017-03-30 DIAGNOSIS — R5383 Other fatigue: Secondary | ICD-10-CM

## 2017-03-30 DIAGNOSIS — Z9189 Other specified personal risk factors, not elsewhere classified: Secondary | ICD-10-CM

## 2017-03-30 DIAGNOSIS — E559 Vitamin D deficiency, unspecified: Secondary | ICD-10-CM | POA: Diagnosis not present

## 2017-03-30 DIAGNOSIS — E1169 Type 2 diabetes mellitus with other specified complication: Secondary | ICD-10-CM | POA: Insufficient documentation

## 2017-03-30 MED ORDER — VITAMIN D (ERGOCALCIFEROL) 1.25 MG (50000 UNIT) PO CAPS: 50000.0000 [IU] | ORAL_CAPSULE | ORAL | 0 refills | Status: DC
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
## 2017-03-31 LAB — LIPID PANEL WITH LDL/HDL RATIO
Cholesterol, Total: 173 mg/dL (ref 100–199)
HDL: 48 mg/dL (ref >39)
LDL CALC: 76 mg/dL (ref 0–99)
LDl/HDL Ratio: 1.6 ratio (ref 0.0–3.2)
Triglycerides: 244 mg/dL — ABNORMAL HIGH (ref 0–149)
VLDL CHOLESTEROL CAL: 49 mg/dL — AB (ref 5–40)

## 2017-03-31 LAB — COMPREHENSIVE METABOLIC PANEL
ALBUMIN: 4.5 g/dL (ref 3.5–5.5)
ALT: 30 IU/L (ref 0–32)
AST: 19 IU/L (ref 0–40)
Albumin/Globulin Ratio: 1.8 (ref 1.2–2.2)
Alkaline Phosphatase: 66 IU/L (ref 39–117)
BILIRUBIN TOTAL: 0.3 mg/dL (ref 0.0–1.2)
BUN / CREAT RATIO: 18 (ref 9–23)
BUN: 14 mg/dL (ref 6–20)
CO2: 24 mmol/L (ref 20–29)
CREATININE: 0.79 mg/dL (ref 0.57–1.00)
Calcium: 9.3 mg/dL (ref 8.7–10.2)
Chloride: 102 mmol/L (ref 96–106)
GFR calc non Af Amer: 97 mL/min/{1.73_m2} (ref >59)
GFR, EST AFRICAN AMERICAN: 111 mL/min/{1.73_m2} (ref >59)
GLUCOSE: 128 mg/dL — AB (ref 65–99)
Globulin, Total: 2.5 g/dL (ref 1.5–4.5)
Potassium: 4.2 mmol/L (ref 3.5–5.2)
Sodium: 140 mmol/L (ref 134–144)
TOTAL PROTEIN: 7 g/dL (ref 6.0–8.5)

## 2017-03-31 LAB — HEMOGLOBIN A1C
Est. average glucose Bld gHb Est-mCnc: 143 mg/dL
HEMOGLOBIN A1C: 6.6 % — AB (ref 4.8–5.6)

## 2017-03-31 LAB — T3: T3 TOTAL: 154 ng/dL (ref 71–180)

## 2017-03-31 LAB — T4, FREE: FREE T4: 1.1 ng/dL (ref 0.82–1.77)

## 2017-03-31 LAB — INSULIN, RANDOM: INSULIN: 32.2 u[IU]/mL — AB (ref 2.6–24.9)

## 2017-03-31 LAB — TSH: TSH: 4.51 u[IU]/mL — ABNORMAL HIGH (ref 0.450–4.500)

## 2017-03-31 LAB — VITAMIN D 25 HYDROXY (VIT D DEFICIENCY, FRACTURES): VIT D 25 HYDROXY: 23.6 ng/mL — AB (ref 30.0–100.0)

## 2017-04-08 NOTE — Telephone Encounter (Signed)
I spoke with pt and she said she is currently still taking them, she stated [facility], so she had plenty refills left, but she has less than a week left now, and according to healthy weight she should be still taking them. Is it ok to refill?

## 2017-04-10 NOTE — Telephone Encounter (Signed)
Great chart review AND thanks for checking with pt - YOU ROCK TASHA!!!!! (rxs sent in - you don't need to do anything - just wanted to make sure you got my major props)

## 2017-04-12 ENCOUNTER — Other Ambulatory Visit: Payer: Self-pay | Admitting: Family Medicine

## 2017-04-12 MED ORDER — METFORMIN HCL 500 MG PO TABS: ORAL_TABLET | ORAL | 1 refills | Status: DC

## 2017-04-20 ENCOUNTER — Ambulatory Visit (INDEPENDENT_AMBULATORY_CARE_PROVIDER_SITE_OTHER): Payer: 59 | Admitting: Physician Assistant

## 2017-04-20 VITALS — BP 119/72 | HR 91 | Temp 98.2°F | Ht 64.0 in | Wt 290.0 lb

## 2017-04-20 DIAGNOSIS — E119 Type 2 diabetes mellitus without complications: Secondary | ICD-10-CM

## 2017-04-20 DIAGNOSIS — R7989 Other specified abnormal findings of blood chemistry: Secondary | ICD-10-CM | POA: Diagnosis not present

## 2017-04-20 DIAGNOSIS — Z9189 Other specified personal risk factors, not elsewhere classified: Secondary | ICD-10-CM | POA: Diagnosis not present

## 2017-04-20 DIAGNOSIS — E559 Vitamin D deficiency, unspecified: Secondary | ICD-10-CM | POA: Diagnosis not present

## 2017-04-20 DIAGNOSIS — Z6841 Body Mass Index (BMI) 40.0 and over, adult: Secondary | ICD-10-CM

## 2017-04-20 MED ORDER — VITAMIN D (ERGOCALCIFEROL) 1.25 MG (50000 UNIT) PO CAPS: 50000.0000 [IU] | ORAL_CAPSULE | ORAL | 0 refills | Status: DC
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
## 2017-04-21 ENCOUNTER — Other Ambulatory Visit: Payer: Self-pay | Admitting: Family Medicine

## 2017-04-21 ENCOUNTER — Telehealth (INDEPENDENT_AMBULATORY_CARE_PROVIDER_SITE_OTHER): Payer: Self-pay

## 2017-04-22 ENCOUNTER — Other Ambulatory Visit: Payer: Self-pay | Admitting: Family Medicine

## 2017-04-22 DIAGNOSIS — E119 Type 2 diabetes mellitus without complications: Secondary | ICD-10-CM

## 2017-04-23 ENCOUNTER — Other Ambulatory Visit: Payer: Self-pay | Admitting: Family Medicine

## 2017-04-23 ENCOUNTER — Encounter: Payer: Self-pay | Admitting: Family Medicine

## 2017-04-23 DIAGNOSIS — E119 Type 2 diabetes mellitus without complications: Secondary | ICD-10-CM

## 2017-04-23 MED ORDER — LIRAGLUTIDE 18 MG/3ML ~~LOC~~ SOPN: 1.8000 mg | PEN_INJECTOR | Freq: Every morning | SUBCUTANEOUS | 0 refills | Status: DC

## 2017-04-26 ENCOUNTER — Telehealth: Payer: Self-pay | Admitting: Family Medicine

## 2017-04-26 NOTE — Telephone Encounter (Signed)
Patient is coming in on 04/30/17 to see Dr Clelia CroftShaw in order to get her FMLA forms updated. I will place the blank forms in Dr Alver FisherShaw's box on 04/26/17 please complete and return to the FMLA/Disability box at the [employer] checkout desk within 5-7 business days.  It is ok that the patient gets a copy at her OV but I need the originals placed back in my back so I can scan them and faxed them to her Employer.

## 2017-04-30 ENCOUNTER — Ambulatory Visit (INDEPENDENT_AMBULATORY_CARE_PROVIDER_SITE_OTHER): Payer: 59 | Admitting: Family Medicine

## 2017-04-30 VITALS — BP 126/72 | HR 88 | Temp 98.1°F | Resp 16 | Ht 64.0 in | Wt 292.0 lb

## 2017-04-30 DIAGNOSIS — Z9109 Other allergy status, other than to drugs and biological substances: Secondary | ICD-10-CM

## 2017-04-30 DIAGNOSIS — M5136 Other intervertebral disc degeneration, lumbar region: Secondary | ICD-10-CM

## 2017-04-30 DIAGNOSIS — G43809 Other migraine, not intractable, without status migrainosus: Secondary | ICD-10-CM

## 2017-04-30 DIAGNOSIS — K582 Mixed irritable bowel syndrome: Secondary | ICD-10-CM

## 2017-04-30 DIAGNOSIS — M797 Fibromyalgia: Secondary | ICD-10-CM

## 2017-04-30 DIAGNOSIS — M5030 Other cervical disc degeneration, unspecified cervical region: Secondary | ICD-10-CM

## 2017-04-30 DIAGNOSIS — J329 Chronic sinusitis, unspecified: Secondary | ICD-10-CM | POA: Diagnosis not present

## 2017-04-30 NOTE — Telephone Encounter (Signed)
[facility] and [facility]
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
## 2017-04-30 NOTE — Patient Instructions (Signed)
     IF you received an x-ray today, you will receive an invoice from [facility]. Please contact [facility] at [phone] with questions or concerns regarding your invoice.   IF you received labwork today, you will receive an invoice from [facility]. Please contact [facility] at [phone] with questions or concerns regarding your invoice.   Our billing staff will not be able to assist you with questions regarding bills from these companies.  You will be contacted with the lab results as soon as they are available. The fastest way to get your results is to activate your My Chart account. Instructions are located on the last page of this paperwork. If you have not heard from us regarding the results in 2 weeks, please contact this office.     

## 2017-05-04 ENCOUNTER — Ambulatory Visit (INDEPENDENT_AMBULATORY_CARE_PROVIDER_SITE_OTHER): Payer: 59 | Admitting: Family Medicine

## 2017-05-04 ENCOUNTER — Encounter: Payer: Self-pay | Admitting: Family Medicine

## 2017-05-04 ENCOUNTER — Ambulatory Visit (INDEPENDENT_AMBULATORY_CARE_PROVIDER_SITE_OTHER): Payer: 59 | Admitting: Physician Assistant

## 2017-05-04 ENCOUNTER — Other Ambulatory Visit: Payer: Self-pay | Admitting: Family Medicine

## 2017-05-04 ENCOUNTER — Encounter (INDEPENDENT_AMBULATORY_CARE_PROVIDER_SITE_OTHER): Payer: Self-pay

## 2017-05-04 VITALS — BP 110/70 | HR 51 | Temp 98.1°F | Resp 18 | Ht 64.0 in | Wt 292.0 lb

## 2017-05-04 DIAGNOSIS — G43101 Migraine with aura, not intractable, with status migrainosus: Secondary | ICD-10-CM

## 2017-05-04 DIAGNOSIS — R197 Diarrhea, unspecified: Secondary | ICD-10-CM | POA: Diagnosis not present

## 2017-05-04 DIAGNOSIS — R202 Paresthesia of skin: Secondary | ICD-10-CM | POA: Diagnosis not present

## 2017-05-04 DIAGNOSIS — R1110 Vomiting, unspecified: Secondary | ICD-10-CM | POA: Diagnosis not present

## 2017-05-04 LAB — CBC WITH DIFFERENTIAL/PLATELET
Basophils Absolute: 0 10*3/uL (ref 0.0–0.2)
Basos: 0 %
EOS (ABSOLUTE): 0.2 10*3/uL (ref 0.0–0.4)
Eos: 3 %
Hematocrit: 39.9 % (ref 34.0–46.6)
Hemoglobin: 12.8 g/dL (ref 11.1–15.9)
Immature Grans (Abs): 0 10*3/uL (ref 0.0–0.1)
Immature Granulocytes: 0 %
Lymphocytes Absolute: 2.4 10*3/uL (ref 0.7–3.1)
Lymphs: 25 %
MCH: 27.5 pg (ref 26.6–33.0)
MCHC: 32.1 g/dL (ref 31.5–35.7)
MCV: 86 fL (ref 79–97)
Monocytes Absolute: 0.6 10*3/uL (ref 0.1–0.9)
Monocytes: 6 %
Neutrophils Absolute: 6.2 10*3/uL (ref 1.4–7.0)
Neutrophils: 66 %
Platelets: 376 10*3/uL (ref 150–379)
RBC: 4.66 x10E6/uL (ref 3.77–5.28)
RDW: 14.8 % (ref 12.3–15.4)
WBC: 9.4 10*3/uL (ref 3.4–10.8)

## 2017-05-04 LAB — COMPREHENSIVE METABOLIC PANEL WITH GFR
ALT: 27 [IU]/L (ref 0–32)
AST: 15 [IU]/L (ref 0–40)
Albumin/Globulin Ratio: 1.5 (ref 1.2–2.2)
Albumin: 4.3 g/dL (ref 3.5–5.5)
Alkaline Phosphatase: 73 [IU]/L (ref 39–117)
BUN/Creatinine Ratio: 17 (ref 9–23)
BUN: 11 mg/dL (ref 6–20)
Bilirubin Total: <0.2 mg/dL (ref 0.0–1.2)
CO2: 22 mmol/L (ref 20–29)
Calcium: 9.7 mg/dL (ref 8.7–10.2)
Chloride: 102 mmol/L (ref 96–106)
Creatinine, Ser: 0.66 mg/dL (ref 0.57–1.00)
GFR calc Af Amer: 131 mL/min/{1.73_m2}
GFR calc non Af Amer: 114 mL/min/{1.73_m2}
Globulin, Total: 2.9 g/dL (ref 1.5–4.5)
Glucose: 110 mg/dL — ABNORMAL HIGH (ref 65–99)
Potassium: 4.5 mmol/L (ref 3.5–5.2)
Sodium: 141 mmol/L (ref 134–144)
Total Protein: 7.2 g/dL (ref 6.0–8.5)

## 2017-05-04 MED ORDER — RIZATRIPTAN BENZOATE 10 MG PO TBDP: 10.0000 mg | ORAL_TABLET | ORAL | 0 refills | Status: DC | PRN

## 2017-05-04 MED ORDER — PROMETHAZINE HCL 25 MG/ML IJ SOLN
25.0000 mg | Freq: Once | INTRAMUSCULAR | Status: AC
  Administered 2017-05-04: 25 mg via INTRAMUSCULAR

## 2017-05-04 MED ORDER — KETOROLAC TROMETHAMINE 60 MG/2ML IM SOLN
60.0000 mg | Freq: Once | INTRAMUSCULAR | Status: AC
  Administered 2017-05-04: 60 mg via INTRAMUSCULAR
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
## 2017-05-04 NOTE — Patient Instructions (Addendum)
This afternoon if you still have a headache, pick up the maxalt and take 1. If your headache still persists in 2 hours, take a second. If your headache is still there tomorrow at noon, take a ketoprofen.  Restart the magnesium at night for migraine prevention although you may need to decrease back to just 3x/wk if it is causing worsening diarrhea and GI discomfort.  Consider adding in a melatonin  IMMEDIATE RELEASE tab 3mg  or higher for migraine prevention as well (will help you fall asleep - take 1-2 hours before bed time on an empty stomach).   IF you received an x-ray today, you will receive an invoice from [facility]. Please contact [facility] at [phone] with questions or concerns regarding your invoice.   IF you received labwork today, you will receive an invoice from [facility]. Please contact [facility] at [phone] with questions or concerns regarding your invoice.   Our billing staff will not be able to assist you with questions regarding bills from these companies.  You will be contacted with the lab results as soon as they are available. The fastest way to get your results is to activate your My Chart account. Instructions are located on the last page of this paperwork. If you have not heard from [name] regarding the results in 2 weeks, please contact this office.      Recurrent Migraine Headache A migraine headache is very bad, throbbing pain that is usually on one side of your head. Recurrent migraines keep coming back (recurring). Talk with your doctor about what things may bring on (trigger) your migraine headaches. Follow these instructions at home: Medicines  Take over-the-counter and prescription medicines only as told by your doctor.  Do not drive or use heavy machinery while taking prescription pain medicine. Lifestyle  Do not use any products that contain nicotine or tobacco, such as cigarettes and e-cigarettes. If you need help quitting, ask your  doctor.  Limit alcohol intake to no more than 1 drink a day for nonpregnant women and 2 drinks a day for men. One drink equals 12 oz of beer, 5 oz of wine, or 1 oz of hard liquor.  Get 7-9 hours of sleep each night.  Lessen any stress in your life. Ask your doctor about ways to lower your stress.  Stay at a healthy weight. Talk with your doctor if you need help losing weight.  Get regular exercise. General instructions  Keep a journal to find out if certain things bring on migraine headaches. For example, write down: ? What you eat and drink. ? How much sleep you get. ? Any change to your diet or medicines.  Lie down in a dark, quiet room when you have a migraine.  Try placing a cool towel over your head when you have a migraine.  Keep lights dim if bright lights bother you or make your migraines worse.  Keep all follow-up visits as told by your doctor. This is important. Contact a doctor if:  Medicine does not help your migraines.  Your pain keeps coming back.  You have a fever.  You have weight loss without trying. Get help right away if:  Your migraine becomes really bad and medicine does not help.  You have a stiff neck.  You have trouble seeing.  Your muscles are weak or you lose control of your muscles.  You lose your balance or have trouble walking.  You feel like you will pass out (faint) or you pass out.  You have really bad symptoms that are different than your first symptoms.  You start having sudden, very bad headaches that last for one second or less, like a thunderclap. Summary  A migraine headache is very bad, throbbing pain that is usually on one side of your head.  Talk with your doctor about what things may bring on (trigger) your migraine headaches.  Take over-the-counter and prescription medicines only as told by your doctor.  Lie down in a dark, quiet room when you have a migraine.  Keep a journal about what you eat and drink, how  much sleep you get, and any changes to your medicines. This can help you find out if certain things make you have migraine headaches. This information is not intended to replace advice given to you by your health care provider. Make sure you discuss any questions you have with your health care provider. Document Released: 01/06/2008 Document Revised: 02/20/2016 Document Reviewed: 02/20/2016 Elsevier Interactive Patient Education  2017 [employer]

## 2017-05-09 ENCOUNTER — Other Ambulatory Visit: Payer: Self-pay | Admitting: Family Medicine

## 2017-05-09 DIAGNOSIS — E119 Type 2 diabetes mellitus without complications: Secondary | ICD-10-CM

## 2017-05-10 ENCOUNTER — Ambulatory Visit (INDEPENDENT_AMBULATORY_CARE_PROVIDER_SITE_OTHER): Payer: 59 | Admitting: Physician Assistant

## 2017-05-10 VITALS — BP 128/77 | HR 95 | Temp 98.7°F | Ht 64.0 in | Wt 289.0 lb

## 2017-05-10 DIAGNOSIS — Z6841 Body Mass Index (BMI) 40.0 and over, adult: Secondary | ICD-10-CM | POA: Diagnosis not present

## 2017-05-10 DIAGNOSIS — E119 Type 2 diabetes mellitus without complications: Secondary | ICD-10-CM | POA: Diagnosis not present
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
## 2017-05-17 ENCOUNTER — Other Ambulatory Visit (INDEPENDENT_AMBULATORY_CARE_PROVIDER_SITE_OTHER): Payer: Self-pay | Admitting: Physician Assistant

## 2017-05-17 DIAGNOSIS — E559 Vitamin D deficiency, unspecified: Secondary | ICD-10-CM

## 2017-05-19 ENCOUNTER — Telehealth: Payer: Self-pay | Admitting: Family Medicine

## 2017-05-19 NOTE — Telephone Encounter (Signed)
05/19/2017 - PATIENT DROPPED OFF [employer] PAPER WORK FOR DR. SHAW AT [address]. SHE SAID DR. SHAW KNOWS ALL ABOUT IT. SHE ALSO PUT A YELLOW STICKY SHEET EXPLAINING THINGS ON HER BLANK PAPER WORK. I WILL SEND THIS MESSAGE TO [facility]. MBC

## 2017-05-19 NOTE — Telephone Encounter (Signed)
05/19/2017 - I PUT THE FORMS IN [facility]'S DISABILITY BOX AT [address]. MBC

## 2017-05-23 NOTE — Telephone Encounter (Signed)
Patient just need forms updated to just state her being out for migraines. I have updated the forms and refaxed them to [employer].

## 2017-05-24 IMAGING — CT CT RENAL STONE PROTOCOL
2 of 3 series · 17 of 46 positions shown, 19 images · non-contrast
Comparison: 09/18/2012

CLINICAL DATA: Right flank pain.

EXAM:
CT ABDOMEN AND PELVIS WITHOUT CONTRAST
TECHNIQUE: Multidetector CT imaging of the abdomen and pelvis was performed
following the standard protocol without IV contrast.

[Series 4: lung · axial · 0.88mm/px · z∈[+1586,+1654]mm · 14 of 40 slices shown, 16 images]
[im 3/40  soft-tissue]
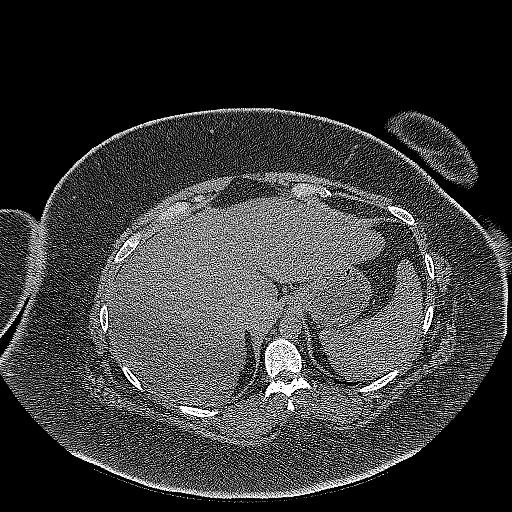
[im 3/40  bone]
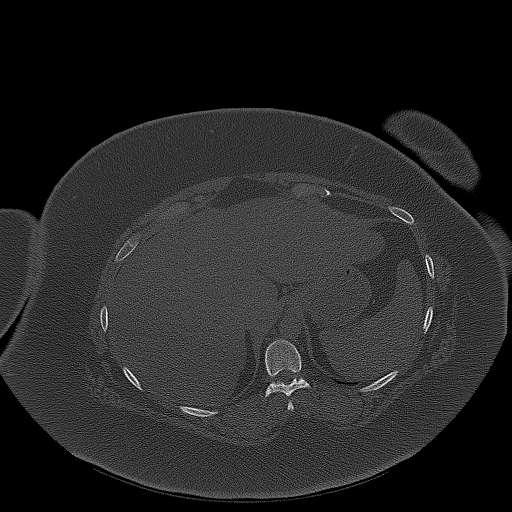
[im 6/40  soft-tissue]
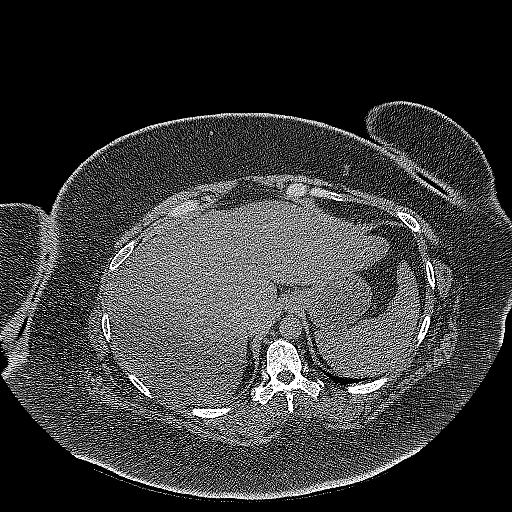
[im 8/40  soft-tissue]
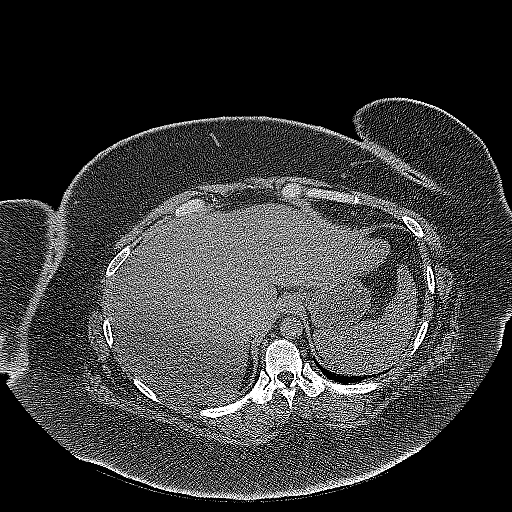
[im 11/40  soft-tissue]
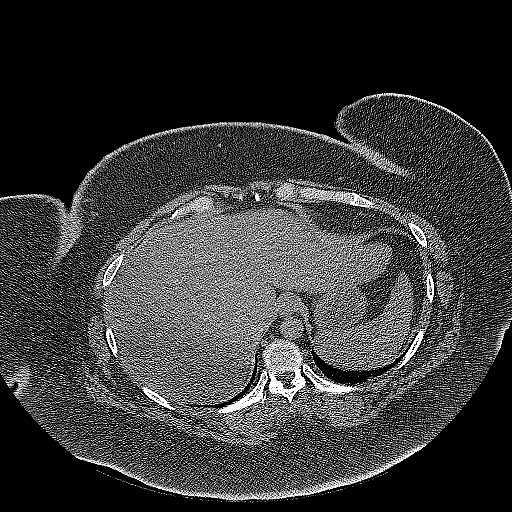
[im 13/40  soft-tissue]
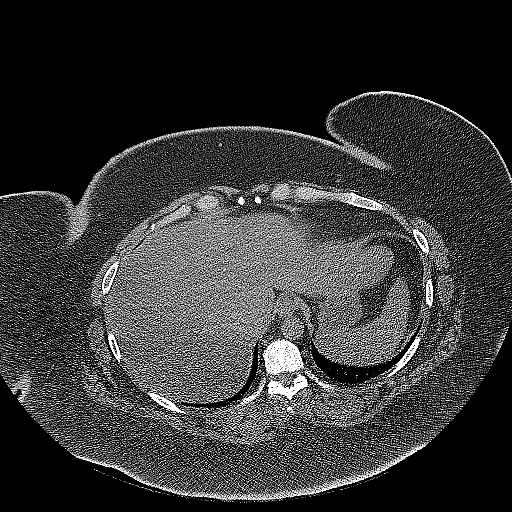
[im 16/40  soft-tissue]
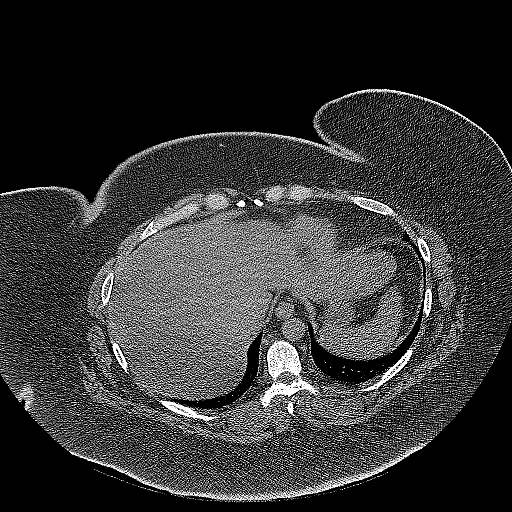
[im 18/40  soft-tissue]
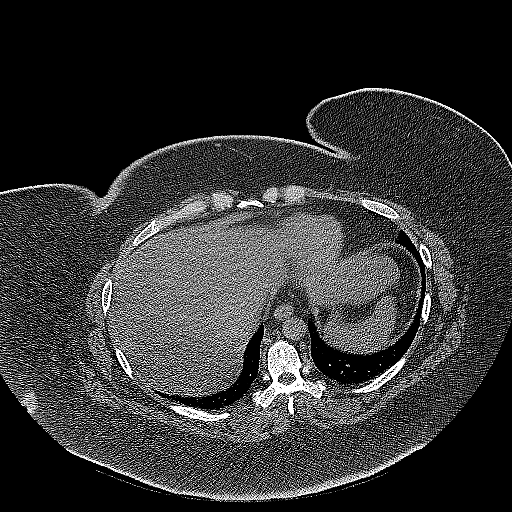
[im 22/40  soft-tissue]
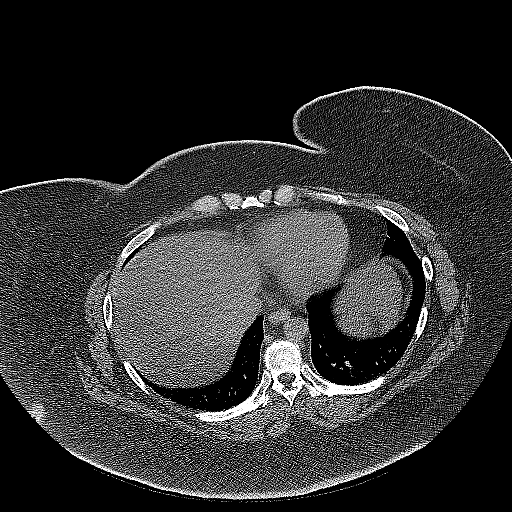
[im 24/40  soft-tissue]
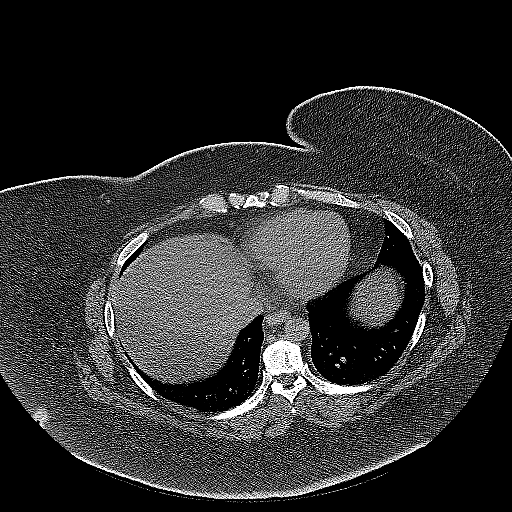
[im 24/40  bone]
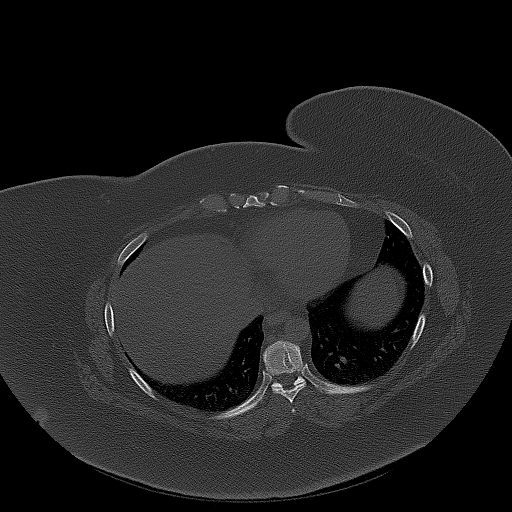
[im 27/40  soft-tissue]
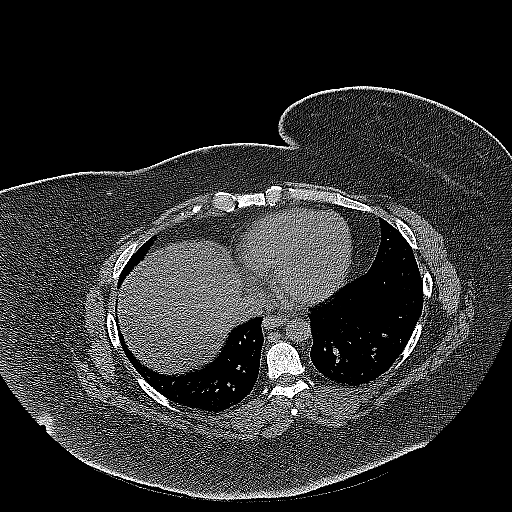
[im 29/40  soft-tissue]
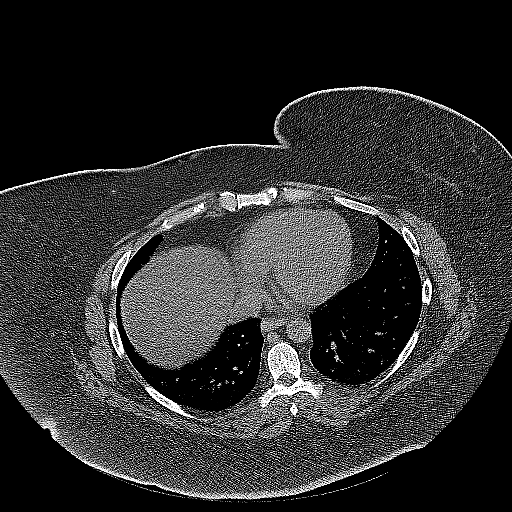
[im 32/40  soft-tissue]
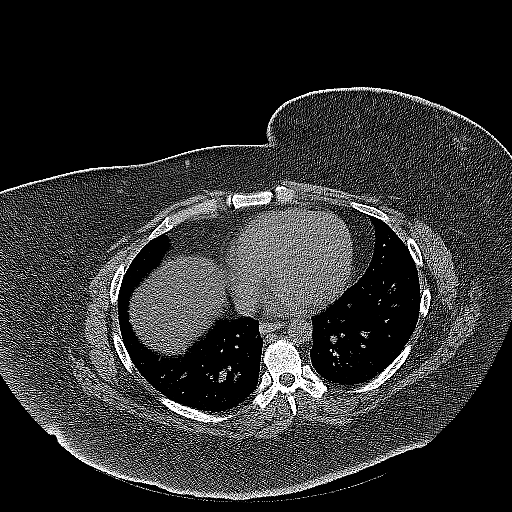
[im 34/40  soft-tissue]
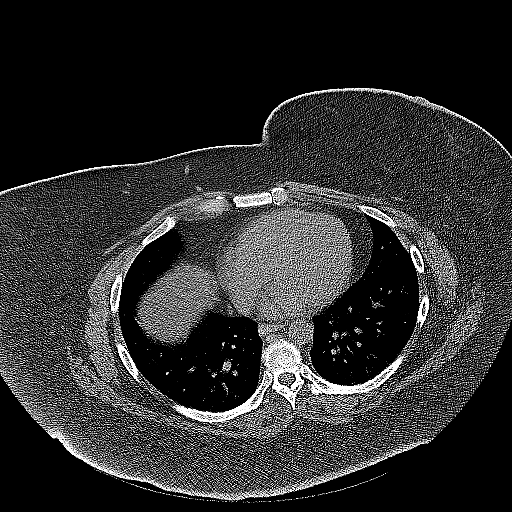
[im 37/40  soft-tissue]
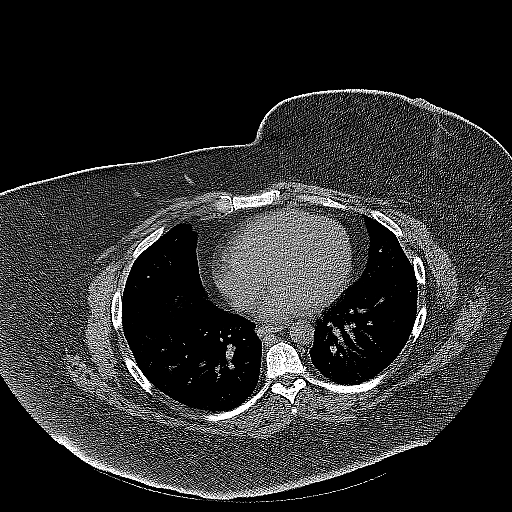

[Series 5: coronal · coronal · 0.94mm/px · 3 of 198 slices shown]
[im 66/198  soft-tissue]
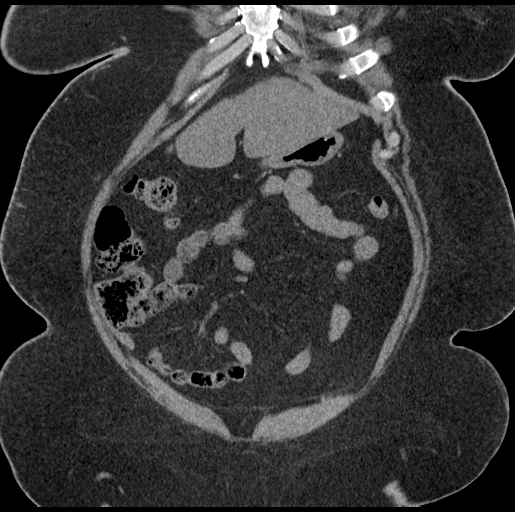
[im 88/198  soft-tissue]
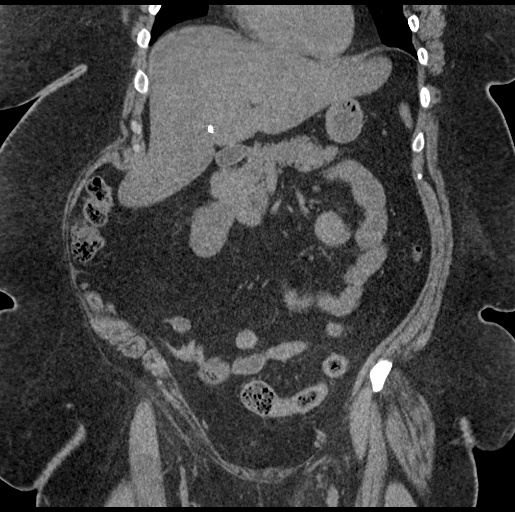
[im 110/198  soft-tissue]
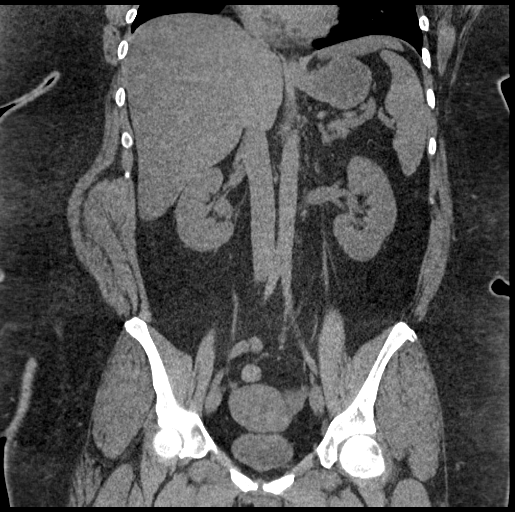

[17 of 46 positions shown; findings below may reference images not displayed]

FINDINGS: Lower chest: Lung bases are clear. No effusions. Heart is normal
size.

Hepatobiliary: Prior cholecystectomy. Mild diffuse fatty
infiltration of the liver. No focal hepatic abnormality.

Pancreas: No focal abnormality or ductal dilatation.

Spleen: No focal abnormality.  Normal size.

Adrenals/Urinary Tract: Mild right hydronephrosis. There is a
punctate 1 mm stone in the mid right ureter on image 53. No
additional renal or ureteral stones. Adrenal glands is unremarkable.
Urinary bladder decompressed, grossly unremarkable.

Stomach/Bowel: Normal appendix. Scattered left colonic
diverticulosis. No active diverticulitis. Stomach and small bowel
decompressed.

Vascular/Lymphatic: No evidence of aneurysm or adenopathy.
Retroaortic left renal vein noted.

Reproductive: Uterus and adnexa unremarkable.  No mass.

Other: No free fluid or free air.

Musculoskeletal: No acute bony abnormality.
IMPRESSION: Punctate 1 mm mid right ureteral stone with mild right
hydronephrosis.

Mild fatty infiltration of the liver.

Left colonic diverticulosis.

## 2017-05-26 ENCOUNTER — Encounter (INDEPENDENT_AMBULATORY_CARE_PROVIDER_SITE_OTHER): Payer: Self-pay

## 2017-05-26 ENCOUNTER — Ambulatory Visit (INDEPENDENT_AMBULATORY_CARE_PROVIDER_SITE_OTHER): Payer: 59 | Admitting: Physician Assistant

## 2017-05-30 ENCOUNTER — Encounter (INDEPENDENT_AMBULATORY_CARE_PROVIDER_SITE_OTHER): Payer: Self-pay

## 2017-05-30 ENCOUNTER — Ambulatory Visit (INDEPENDENT_AMBULATORY_CARE_PROVIDER_SITE_OTHER): Payer: 59 | Admitting: Physician Assistant

## 2017-05-30 VITALS — BP 114/75 | HR 86 | Temp 97.9°F | Ht 64.0 in | Wt 288.0 lb

## 2017-05-30 DIAGNOSIS — E559 Vitamin D deficiency, unspecified: Secondary | ICD-10-CM

## 2017-05-30 DIAGNOSIS — E119 Type 2 diabetes mellitus without complications: Secondary | ICD-10-CM | POA: Diagnosis not present

## 2017-05-30 DIAGNOSIS — Z9189 Other specified personal risk factors, not elsewhere classified: Secondary | ICD-10-CM

## 2017-05-30 DIAGNOSIS — Z6841 Body Mass Index (BMI) 40.0 and over, adult: Secondary | ICD-10-CM | POA: Diagnosis not present

## 2017-05-30 DIAGNOSIS — E66813 Obesity, class 3: Secondary | ICD-10-CM

## 2017-05-30 MED ORDER — VITAMIN D (ERGOCALCIFEROL) 1.25 MG (50000 UNIT) PO CAPS: 50000.0000 [IU] | ORAL_CAPSULE | ORAL | 0 refills | Status: DC
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
## 2017-06-06 ENCOUNTER — Other Ambulatory Visit: Payer: Self-pay | Admitting: Family Medicine

## 2017-06-06 DIAGNOSIS — E119 Type 2 diabetes mellitus without complications: Secondary | ICD-10-CM

## 2017-06-11 ENCOUNTER — Ambulatory Visit (INDEPENDENT_AMBULATORY_CARE_PROVIDER_SITE_OTHER): Payer: 59 | Admitting: Family Medicine

## 2017-06-11 ENCOUNTER — Encounter: Payer: Self-pay | Admitting: Family Medicine

## 2017-06-11 VITALS — BP 110/80 | HR 108 | Temp 98.3°F | Resp 16 | Ht 64.0 in | Wt 294.2 lb

## 2017-06-11 DIAGNOSIS — F418 Other specified anxiety disorders: Secondary | ICD-10-CM

## 2017-06-11 DIAGNOSIS — E119 Type 2 diabetes mellitus without complications: Secondary | ICD-10-CM

## 2017-06-11 DIAGNOSIS — J0111 Acute recurrent frontal sinusitis: Secondary | ICD-10-CM | POA: Diagnosis not present

## 2017-06-11 DIAGNOSIS — F4310 Post-traumatic stress disorder, unspecified: Secondary | ICD-10-CM

## 2017-06-11 MED ORDER — CLARITHROMYCIN ER 500 MG PO TB24: 1000.0000 mg | ORAL_TABLET | Freq: Every day | ORAL | 0 refills | Status: DC

## 2017-06-11 MED ORDER — BUPROPION HCL ER (SR) 150 MG PO TB12: 300.0000 mg | ORAL_TABLET | Freq: Every day | ORAL | 0 refills | Status: DC

## 2017-06-11 MED ORDER — FLUCONAZOLE 150 MG PO TABS: 150.0000 mg | ORAL_TABLET | Freq: Once | ORAL | 0 refills | Status: AC

## 2017-06-11 MED ORDER — EXENATIDE 10 MCG/0.04ML ~~LOC~~ SOPN: 10.0000 ug | PEN_INJECTOR | Freq: Two times a day (BID) | SUBCUTANEOUS | 1 refills | Status: DC

## 2017-06-11 MED ORDER — METHYLPREDNISOLONE ACETATE 80 MG/ML IJ SUSP
80.0000 mg | Freq: Once | INTRAMUSCULAR | Status: AC
  Administered 2017-06-11: 80 mg via INTRAMUSCULAR

## 2017-06-11 NOTE — Telephone Encounter (Signed)
Please help pt with this PA.
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
## 2017-06-11 NOTE — Patient Instructions (Addendum)
   IF you received an x-ray today, you will receive an invoice from [facility]. Please contact [facility] at [phone] with questions or concerns regarding your invoice.   IF you received labwork today, you will receive an invoice from [facility]. Please contact [facility] at [phone] with questions or concerns regarding your invoice.   Our billing staff will not be able to assist you with questions regarding bills from these companies.  You will be contacted with the lab results as soon as they are available. The fastest way to get your results is to activate your My Chart account. Instructions are located on the last page of this paperwork. If you have not heard from us regarding the results in 2 weeks, please contact this office.      Sinusitis, Adult Sinusitis is soreness and inflammation of your sinuses. Sinuses are hollow spaces in the bones around your face. Your sinuses are located:  Around your eyes.  In the middle of your forehead.  Behind your nose.  In your cheekbones.  Your sinuses and nasal passages are lined with a stringy fluid (mucus). Mucus normally drains out of your sinuses. When your nasal tissues become inflamed or swollen, the mucus can become trapped or blocked so air cannot flow through your sinuses. This allows bacteria, viruses, and funguses to grow, which leads to infection. Sinusitis can develop quickly and last for 7?10 days (acute) or for more than 12 weeks (chronic). Sinusitis often develops after a cold. What are the causes? This condition is caused by anything that creates swelling in the sinuses or stops mucus from draining, including:  Allergies.  Asthma.  Bacterial or viral infection.  Abnormally shaped bones between the nasal passages.  Nasal growths that contain mucus (nasal polyps).  Narrow sinus openings.  Pollutants, such as chemicals or irritants in the air.  A foreign object stuck in the nose.  A fungal  infection. This is rare.  What increases the risk? The following factors may make you more likely to develop this condition:  Having allergies or asthma.  Having had a recent cold or respiratory tract infection.  Having structural deformities or blockages in your nose or sinuses.  Having a weak immune system.  Doing a lot of swimming or diving.  Overusing nasal sprays.  Smoking.  What are the signs or symptoms? The main symptoms of this condition are pain and a feeling of pressure around the affected sinuses. Other symptoms include:  Upper toothache.  Earache.  Headache.  Bad breath.  Decreased sense of smell and taste.  A cough that may get worse at night.  Fatigue.  Fever.  Thick drainage from your nose. The drainage is often green and it may contain pus (purulent).  Stuffy nose or congestion.  Postnasal drip. This is when extra mucus collects in the throat or back of the nose.  Swelling and warmth over the affected sinuses.  Sore throat.  Sensitivity to light.  How is this diagnosed? This condition is diagnosed based on symptoms, a medical history, and a physical exam. To find out if your condition is acute or chronic, your health care provider may:  Look in your nose for signs of nasal polyps.  Tap over the affected sinus to check for signs of infection.  View the inside of your sinuses using an imaging device that has a light attached (endoscope).  If your health care provider suspects that you have chronic sinusitis, you may also:  Be tested for allergies.    Have a sample of mucus taken from your nose (nasal culture) and checked for bacteria.  Have a mucus sample examined to see if your sinusitis is related to an allergy.  If your sinusitis does not respond to treatment and it lasts longer than 8 weeks, you may have an MRI or CT scan to check your sinuses. These scans also help to determine how severe your infection is. In rare cases, a bone  biopsy may be done to rule out more serious types of fungal sinus disease. How is this treated? Treatment for sinusitis depends on the cause and whether your condition is chronic or acute. If a virus is causing your sinusitis, your symptoms will go away on their own within 10 days. You may be given medicines to relieve your symptoms, including:  Topical nasal decongestants. They shrink swollen nasal passages and let mucus drain from your sinuses.  Antihistamines. These drugs block inflammation that is triggered by allergies. This can help to ease swelling in your nose and sinuses.  Topical nasal corticosteroids. These are nasal sprays that ease inflammation and swelling in your nose and sinuses.  Nasal saline washes. These rinses can help to get rid of thick mucus in your nose.  If your condition is caused by bacteria, you will be given an antibiotic medicine. If your condition is caused by a fungus, you will be given an antifungal medicine. Surgery may be needed to correct underlying conditions, such as narrow nasal passages. Surgery may also be needed to remove polyps. Follow these instructions at home: Medicines  Take, use, or apply over-the-counter and prescription medicines only as told by your health care provider. These may include nasal sprays.  If you were prescribed an antibiotic medicine, take it as told by your health care provider. Do not stop taking the antibiotic even if you start to feel better. Hydrate and Humidify  Drink enough water to keep your urine clear or pale yellow. Staying hydrated will help to thin your mucus.  Use a cool mist humidifier to keep the humidity level in your home above 50%.  Inhale steam for 10-15 minutes, 3-4 times a day or as told by your health care provider. You can do this in the bathroom while a hot shower is running.  Limit your exposure to cool or dry air. Rest  Rest as much as possible.  Sleep with your head raised  (elevated).  Make sure to get enough sleep each night. General instructions  Apply a warm, moist washcloth to your face 3-4 times a day or as told by your health care provider. This will help with discomfort.  Wash your hands often with soap and water to reduce your exposure to viruses and other germs. If soap and water are not available, use hand sanitizer.  Do not smoke. Avoid being around people who are smoking (secondhand smoke).  Keep all follow-up visits as told by your health care provider. This is important. Contact a health care provider if:  You have a fever.  Your symptoms get worse.  Your symptoms do not improve within 10 days. Get help right away if:  You have a severe headache.  You have persistent vomiting.  You have pain or swelling around your face or eyes.  You have vision problems.  You develop confusion.  Your neck is stiff.  You have trouble breathing. This information is not intended to replace advice given to you by your health care provider. Make sure you discuss any questions you   have with your health care provider. Document Released: 03/29/2005 Document Revised: 11/23/2015 Document Reviewed: 01/22/2015 Elsevier Interactive Patient Education  2018 [employer]  

## 2017-06-15 ENCOUNTER — Ambulatory Visit (INDEPENDENT_AMBULATORY_CARE_PROVIDER_SITE_OTHER): Payer: 59 | Admitting: Physician Assistant

## 2017-06-15 VITALS — BP 111/77 | HR 76 | Temp 98.2°F | Ht 64.0 in | Wt 289.0 lb

## 2017-06-15 DIAGNOSIS — Z6841 Body Mass Index (BMI) 40.0 and over, adult: Secondary | ICD-10-CM | POA: Diagnosis not present

## 2017-06-15 DIAGNOSIS — E119 Type 2 diabetes mellitus without complications: Secondary | ICD-10-CM

## 2017-06-15 DIAGNOSIS — E66813 Obesity, class 3: Secondary | ICD-10-CM
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
## 2017-06-23 ENCOUNTER — Telehealth (INDEPENDENT_AMBULATORY_CARE_PROVIDER_SITE_OTHER): Payer: Self-pay | Admitting: Physician Assistant

## 2017-06-23 ENCOUNTER — Other Ambulatory Visit (INDEPENDENT_AMBULATORY_CARE_PROVIDER_SITE_OTHER): Payer: Self-pay

## 2017-06-23 DIAGNOSIS — E119 Type 2 diabetes mellitus without complications: Secondary | ICD-10-CM

## 2017-06-23 MED ORDER — INSULIN PEN NEEDLE 32G X 4 MM MISC: 1.0000 | Freq: Two times a day (BID) | 0 refills | Status: DC

## 2017-06-23 NOTE — Telephone Encounter (Signed)
Please refill.

## 2017-06-23 NOTE — Telephone Encounter (Signed)
[facility] April, CMA

## 2017-06-23 NOTE — Telephone Encounter (Signed)
Patient is requesting a ref[facility] on her [facility] [address] Thank you. Selena BattenKim

## 2017-06-25 ENCOUNTER — Encounter: Payer: Self-pay | Admitting: Family Medicine

## 2017-06-25 ENCOUNTER — Ambulatory Visit (INDEPENDENT_AMBULATORY_CARE_PROVIDER_SITE_OTHER): Payer: 59 | Admitting: Family Medicine

## 2017-06-25 ENCOUNTER — Other Ambulatory Visit: Payer: Self-pay

## 2017-06-25 VITALS — BP 126/76 | HR 121 | Temp 98.6°F | Resp 18 | Ht 64.0 in | Wt 298.4 lb

## 2017-06-25 DIAGNOSIS — E118 Type 2 diabetes mellitus with unspecified complications: Secondary | ICD-10-CM

## 2017-06-25 DIAGNOSIS — F332 Major depressive disorder, recurrent severe without psychotic features: Secondary | ICD-10-CM | POA: Diagnosis not present

## 2017-06-25 DIAGNOSIS — J0111 Acute recurrent frontal sinusitis: Secondary | ICD-10-CM

## 2017-06-25 DIAGNOSIS — M5030 Other cervical disc degeneration, unspecified cervical region: Secondary | ICD-10-CM | POA: Diagnosis not present

## 2017-06-25 DIAGNOSIS — G8929 Other chronic pain: Secondary | ICD-10-CM | POA: Diagnosis not present

## 2017-06-25 DIAGNOSIS — M797 Fibromyalgia: Secondary | ICD-10-CM | POA: Diagnosis not present

## 2017-06-25 DIAGNOSIS — M545 Low back pain, unspecified: Secondary | ICD-10-CM

## 2017-06-25 DIAGNOSIS — M5136 Other intervertebral disc degeneration, lumbar region: Secondary | ICD-10-CM | POA: Diagnosis not present

## 2017-06-25 MED ORDER — NYSTATIN-TRIAMCINOLONE 100000-0.1 UNIT/GM-% EX OINT: 1.0000 "application " | TOPICAL_OINTMENT | Freq: Two times a day (BID) | CUTANEOUS | 0 refills | Status: DC
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
## 2017-06-25 NOTE — Patient Instructions (Addendum)
   IF you received an x-ray today, you will receive an invoice from [facility]. Please contact [facility] at [phone] with questions or concerns regarding your invoice.   IF you received labwork today, you will receive an invoice from [facility]. Please contact [facility] at [phone] with questions or concerns regarding your invoice.   Our billing staff will not be able to assist you with questions regarding bills from these companies.  You will be contacted with the lab results as soon as they are available. The fastest way to get your results is to activate your My Chart account. Instructions are located on the last page of this paperwork. If you have not heard from us regarding the results in 2 weeks, please contact this office.    Allergic Rhinitis, Adult Allergic rhinitis is an allergic reaction that affects the mucous membrane inside the nose. It causes sneezing, a runny or stuffy nose, and the feeling of mucus going down the back of the throat (postnasal drip). Allergic rhinitis can be mild to severe. There are two types of allergic rhinitis:  Seasonal. This type is also called hay fever. It happens only during certain seasons.  Perennial. This type can happen at any time of the year.  What are the causes? This condition happens when the body's defense system (immune system) responds to certain harmless substances called allergens as though they were germs.  Seasonal allergic rhinitis is triggered by pollen, which can come from grasses, trees, and weeds. Perennial allergic rhinitis may be caused by:  House dust mites.  Pet dander.  Mold spores.  What are the signs or symptoms? Symptoms of this condition include:  Sneezing.  Runny or stuffy nose (nasal congestion).  Postnasal drip.  Itchy nose.  Tearing of the eyes.  Trouble sleeping.  Daytime sleepiness.  How is this diagnosed? This condition may be diagnosed based on:  Your medical  history.  A physical exam.  Tests to check for related conditions, such as: ? Asthma. ? Pink eye. ? Ear infection. ? Upper respiratory infection.  Tests to find out which allergens trigger your symptoms. These may include skin or blood tests.  How is this treated? There is no cure for this condition, but treatment can help control symptoms. Treatment may include:  Taking medicines that block allergy symptoms, such as antihistamines. Medicine may be given as a shot, nasal spray, or pill.  Avoiding the allergen.  Desensitization. This treatment involves getting ongoing shots until your body becomes less sensitive to the allergen. This treatment may be done if other treatments do not help.  If taking medicine and avoiding the allergen does not work, new, stronger medicines may be prescribed.  Follow these instructions at home:  Find out what you are allergic to. Common allergens include smoke, dust, and pollen.  Avoid the things you are allergic to. These are some things you can do to help avoid allergens: ? Replace carpet with wood, tile, or vinyl flooring. Carpet can trap dander and dust. ? Do not smoke. Do not allow smoking in your home. ? Change your heating and air conditioning filter at least once a month. ? During allergy season:  Keep windows closed as much as possible.  Plan outdoor activities when pollen counts are lowest. This is usually during the evening hours.  When coming indoors, change clothing and shower before sitting on furniture or bedding.  Take over-the-counter and prescription medicines only as told by your health care provider.  Keep all   follow-up visits as told by your health care provider. This is important. Contact a health care provider if:  You have a fever.  You develop a persistent cough.  You make whistling sounds when you breathe (you wheeze).  Your symptoms interfere with your normal daily activities. Get help right away if:  You  have shortness of breath. Summary  This condition can be managed by taking medicines as directed and avoiding allergens.  Contact your health care provider if you develop a persistent cough or fever.  During allergy season, keep windows closed as much as possible. This information is not intended to replace advice given to you by your health care provider. Make sure you discuss any questions you have with your health care provider. Document Released: 12/22/2000 Document Revised: 05/06/2016 Document Reviewed: 05/06/2016 Elsevier Interactive Patient Education  2018 [employer]  

## 2017-06-30 ENCOUNTER — Other Ambulatory Visit (INDEPENDENT_AMBULATORY_CARE_PROVIDER_SITE_OTHER): Payer: Self-pay | Admitting: Physician Assistant

## 2017-06-30 ENCOUNTER — Other Ambulatory Visit: Payer: Self-pay | Admitting: Family Medicine

## 2017-06-30 ENCOUNTER — Telehealth (INDEPENDENT_AMBULATORY_CARE_PROVIDER_SITE_OTHER): Payer: Self-pay | Admitting: Family Medicine

## 2017-06-30 ENCOUNTER — Ambulatory Visit (INDEPENDENT_AMBULATORY_CARE_PROVIDER_SITE_OTHER): Payer: 59 | Admitting: Physician Assistant

## 2017-06-30 VITALS — BP 122/79 | HR 88 | Temp 98.1°F | Ht 64.0 in | Wt 290.0 lb

## 2017-06-30 DIAGNOSIS — E559 Vitamin D deficiency, unspecified: Secondary | ICD-10-CM

## 2017-06-30 DIAGNOSIS — F339 Major depressive disorder, recurrent, unspecified: Secondary | ICD-10-CM

## 2017-06-30 DIAGNOSIS — Z9189 Other specified personal risk factors, not elsewhere classified: Secondary | ICD-10-CM

## 2017-06-30 DIAGNOSIS — E66813 Obesity, class 3: Secondary | ICD-10-CM

## 2017-06-30 DIAGNOSIS — Z6841 Body Mass Index (BMI) 40.0 and over, adult: Secondary | ICD-10-CM

## 2017-06-30 MED ORDER — VITAMIN D (ERGOCALCIFEROL) 1.25 MG (50000 UNIT) PO CAPS: 50000.0000 [IU] | ORAL_CAPSULE | ORAL | 0 refills | Status: DC
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
## 2017-06-30 NOTE — Telephone Encounter (Signed)
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

## 2017-07-07 ENCOUNTER — Telehealth: Payer: Self-pay

## 2017-07-07 NOTE — Telephone Encounter (Signed)
Do we have this paperwork ready?  Copied from CRM [id number]. Topic: Inquiry >> Jul 07, 2017 10:44 AM Diana EvesHoyt, Maryann B wrote: Reason for CRM: pt calling in checking on the ADA PPW for her sit stand desk at work.

## 2017-07-14 ENCOUNTER — Other Ambulatory Visit: Payer: Self-pay | Admitting: Family Medicine

## 2017-07-15 NOTE — Telephone Encounter (Signed)
No. Maybe next wk

## 2017-07-18 ENCOUNTER — Ambulatory Visit (INDEPENDENT_AMBULATORY_CARE_PROVIDER_SITE_OTHER): Payer: 59 | Admitting: Physician Assistant

## 2017-07-18 VITALS — BP 120/76 | HR 78 | Temp 98.2°F | Ht 64.0 in | Wt 298.0 lb

## 2017-07-18 DIAGNOSIS — Z6841 Body Mass Index (BMI) 40.0 and over, adult: Secondary | ICD-10-CM | POA: Diagnosis not present

## 2017-07-18 DIAGNOSIS — E559 Vitamin D deficiency, unspecified: Secondary | ICD-10-CM | POA: Diagnosis not present
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
## 2017-07-20 ENCOUNTER — Encounter: Payer: Self-pay | Admitting: Physician Assistant

## 2017-07-22 NOTE — Telephone Encounter (Signed)
Paperwork done. Gave to heather to copy for copy to be put in scan pile and original placed in 102 front office in envelop for pt pickup. Pt notified.

## 2017-07-26 ENCOUNTER — Other Ambulatory Visit: Payer: Self-pay | Admitting: Family Medicine

## 2017-07-30 ENCOUNTER — Other Ambulatory Visit (INDEPENDENT_AMBULATORY_CARE_PROVIDER_SITE_OTHER): Payer: Self-pay | Admitting: Physician Assistant

## 2017-07-30 DIAGNOSIS — E559 Vitamin D deficiency, unspecified: Secondary | ICD-10-CM

## 2017-08-03 ENCOUNTER — Ambulatory Visit (INDEPENDENT_AMBULATORY_CARE_PROVIDER_SITE_OTHER): Payer: 59 | Admitting: Physician Assistant

## 2017-08-03 VITALS — BP 126/85 | HR 93 | Temp 97.9°F | Ht 64.0 in | Wt 296.0 lb

## 2017-08-03 DIAGNOSIS — Z9189 Other specified personal risk factors, not elsewhere classified: Secondary | ICD-10-CM

## 2017-08-03 DIAGNOSIS — E119 Type 2 diabetes mellitus without complications: Secondary | ICD-10-CM | POA: Diagnosis not present

## 2017-08-03 DIAGNOSIS — E559 Vitamin D deficiency, unspecified: Secondary | ICD-10-CM

## 2017-08-03 DIAGNOSIS — Z6841 Body Mass Index (BMI) 40.0 and over, adult: Secondary | ICD-10-CM

## 2017-08-03 MED ORDER — VITAMIN D (ERGOCALCIFEROL) 1.25 MG (50000 UNIT) PO CAPS: 50000.0000 [IU] | ORAL_CAPSULE | ORAL | 0 refills | Status: DC
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
## 2017-08-11 ENCOUNTER — Other Ambulatory Visit: Payer: Self-pay | Admitting: Family Medicine

## 2017-08-12 NOTE — Telephone Encounter (Signed)
LOV  06/25/17 Dr. Clelia Croft Last refill 02/26/17  # 180 with 1 refill

## 2017-08-19 ENCOUNTER — Other Ambulatory Visit: Payer: Self-pay | Admitting: Family Medicine

## 2017-08-22 ENCOUNTER — Telehealth (INDEPENDENT_AMBULATORY_CARE_PROVIDER_SITE_OTHER): Payer: Self-pay

## 2017-08-22 ENCOUNTER — Ambulatory Visit (INDEPENDENT_AMBULATORY_CARE_PROVIDER_SITE_OTHER): Payer: 59 | Admitting: Physician Assistant

## 2017-08-22 VITALS — BP 116/74 | HR 72 | Temp 97.8°F | Ht 64.0 in | Wt 298.0 lb

## 2017-08-22 DIAGNOSIS — Z9189 Other specified personal risk factors, not elsewhere classified: Secondary | ICD-10-CM

## 2017-08-22 DIAGNOSIS — E559 Vitamin D deficiency, unspecified: Secondary | ICD-10-CM

## 2017-08-22 DIAGNOSIS — E119 Type 2 diabetes mellitus without complications: Secondary | ICD-10-CM

## 2017-08-22 DIAGNOSIS — Z6841 Body Mass Index (BMI) 40.0 and over, adult: Secondary | ICD-10-CM

## 2017-08-22 MED ORDER — INSULIN PEN NEEDLE 32G X 4 MM MISC: 1.0000 | Freq: Two times a day (BID) | 0 refills | Status: DC
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
## 2017-08-22 NOTE — Telephone Encounter (Signed)
Pt made the said appt this morning.  Kerri Ford

## 2017-08-24 ENCOUNTER — Other Ambulatory Visit: Payer: Self-pay

## 2017-08-24 ENCOUNTER — Encounter: Payer: Self-pay | Admitting: Physician Assistant

## 2017-08-24 ENCOUNTER — Ambulatory Visit (INDEPENDENT_AMBULATORY_CARE_PROVIDER_SITE_OTHER): Payer: 59 | Admitting: Physician Assistant

## 2017-08-24 VITALS — BP 122/88 | HR 100 | Temp 99.6°F | Resp 16 | Ht 64.0 in | Wt 303.8 lb

## 2017-08-24 DIAGNOSIS — R519 Headache, unspecified: Secondary | ICD-10-CM

## 2017-08-24 DIAGNOSIS — R51 Headache: Secondary | ICD-10-CM
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
## 2017-08-24 NOTE — Patient Instructions (Addendum)
Continue the cyclobenzaprine. You can use the Valium in place of of the Klonopin. It's OK to try the ketoprofen. Apply a warm compress (heating pad) to the back of your head.  [address] [place], [place] [address]  [phone] [email or web address]  [email or web address]     IF you received an x-ray today, you will receive an invoice from [facility]. Please contact [facility] at [phone] with questions or concerns regarding your invoice.   IF you received labwork today, you will receive an invoice from [facility]. Please contact [facility] at [phone] with questions or concerns regarding your invoice.   Our billing staff will not be able to assist you with questions regarding bills from these companies.  You will be contacted with the lab results as soon as they are available. The fastest way to get your results is to activate your My Chart account. Instructions are located on the last page of this paperwork. If you have not heard from [name] regarding the results in 2 weeks, please contact this office.

## 2017-08-28 ENCOUNTER — Other Ambulatory Visit (INDEPENDENT_AMBULATORY_CARE_PROVIDER_SITE_OTHER): Payer: Self-pay | Admitting: Physician Assistant

## 2017-08-28 DIAGNOSIS — E559 Vitamin D deficiency, unspecified: Secondary | ICD-10-CM

## 2017-09-01 ENCOUNTER — Other Ambulatory Visit: Payer: Self-pay | Admitting: Family Medicine

## 2017-09-08 ENCOUNTER — Ambulatory Visit (INDEPENDENT_AMBULATORY_CARE_PROVIDER_SITE_OTHER): Payer: 59 | Admitting: Family Medicine

## 2017-09-08 VITALS — BP 112/75 | HR 98 | Temp 97.9°F | Ht 64.0 in | Wt 306.0 lb

## 2017-09-08 DIAGNOSIS — Z6841 Body Mass Index (BMI) 40.0 and over, adult: Secondary | ICD-10-CM

## 2017-09-08 DIAGNOSIS — E559 Vitamin D deficiency, unspecified: Secondary | ICD-10-CM

## 2017-09-08 DIAGNOSIS — E1165 Type 2 diabetes mellitus with hyperglycemia: Secondary | ICD-10-CM
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
## 2017-09-11 ENCOUNTER — Other Ambulatory Visit: Payer: Self-pay | Admitting: Family Medicine

## 2017-09-11 MED ORDER — FLUCONAZOLE 150 MG PO TABS: 150.0000 mg | ORAL_TABLET | Freq: Once | ORAL | 2 refills | Status: AC

## 2017-09-13 ENCOUNTER — Other Ambulatory Visit: Payer: Self-pay | Admitting: Family Medicine

## 2017-09-27 ENCOUNTER — Other Ambulatory Visit: Payer: Self-pay | Admitting: Family Medicine

## 2017-09-28 ENCOUNTER — Ambulatory Visit (INDEPENDENT_AMBULATORY_CARE_PROVIDER_SITE_OTHER): Payer: 59 | Admitting: Family Medicine

## 2017-10-04 ENCOUNTER — Other Ambulatory Visit: Payer: Self-pay | Admitting: Family Medicine

## 2017-10-04 NOTE — Telephone Encounter (Signed)
Refill request Flexeril 10 mg  LOV 08/24/2017  Kerri Ford  Last Filled 03-23-2017 Dr Clelia CroftShaw 90 tabs 1 refill  1 q hs  Pharmacy on File

## 2017-10-05 ENCOUNTER — Encounter: Payer: Self-pay | Admitting: Family Medicine

## 2017-10-05 ENCOUNTER — Other Ambulatory Visit: Payer: Self-pay

## 2017-10-05 ENCOUNTER — Ambulatory Visit (INDEPENDENT_AMBULATORY_CARE_PROVIDER_SITE_OTHER): Payer: 59 | Admitting: Family Medicine

## 2017-10-05 VITALS — BP 113/81 | HR 100 | Temp 98.2°F | Resp 16 | Ht 64.0 in | Wt 307.4 lb

## 2017-10-05 DIAGNOSIS — R51 Headache: Secondary | ICD-10-CM | POA: Diagnosis not present

## 2017-10-05 DIAGNOSIS — M797 Fibromyalgia: Secondary | ICD-10-CM | POA: Diagnosis not present

## 2017-10-05 DIAGNOSIS — J324 Chronic pansinusitis: Secondary | ICD-10-CM | POA: Diagnosis not present

## 2017-10-05 DIAGNOSIS — E119 Type 2 diabetes mellitus without complications: Secondary | ICD-10-CM | POA: Diagnosis not present

## 2017-10-05 DIAGNOSIS — R519 Headache, unspecified: Secondary | ICD-10-CM

## 2017-10-05 DIAGNOSIS — M5481 Occipital neuralgia: Secondary | ICD-10-CM

## 2017-10-05 MED ORDER — AMOXICILLIN-POT CLAVULANATE 875-125 MG PO TABS: 1.0000 | ORAL_TABLET | Freq: Two times a day (BID) | ORAL | 0 refills | Status: DC

## 2017-10-05 MED ORDER — METHYLPREDNISOLONE ACETATE 80 MG/ML IJ SUSP
120.0000 mg | Freq: Once | INTRAMUSCULAR | Status: AC
  Administered 2017-10-05: 120 mg via INTRAMUSCULAR
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
## 2017-10-05 NOTE — Patient Instructions (Addendum)
IF you received an x-ray today, you will receive an invoice from [facility]. Please contact [facility] at [phone] with questions or concerns regarding your invoice.   IF you received labwork today, you will receive an invoice from [facility]. Please contact [facility] at [phone] with questions or concerns regarding your invoice.   Our billing staff will not be able to assist you with questions regarding bills from these companies.  You will be contacted with the lab results as soon as they are available. The fastest way to get your results is to activate your My Chart account. Instructions are located on the last page of this paperwork. If you have not heard from [name] regarding the results in 2 weeks, please contact this office.     Occipital Neuralgia Occipital neuralgia is a type of headache that causes episodes of very bad pain in the back of your head. Pain from occipital neuralgia may spread (radiate) to other parts of your head. The pain is usually brief and often goes away after you rest and relax. These headaches may be caused by irritation of the nerves that leave your spinal cord high up in your neck, just below the base of your skull (occipital nerves). Your occipital nerves transmit sensations from the back of your head, the top of your head, and the areas behind your ears. What are the causes? Occipital neuralgia can occur without any known cause (primary headache syndrome). In other cases, occipital neuralgia is caused by pressure on or irritation of one of the two occipital nerves. Causes of occipital nerve compression or irritation include:  Wear and tear of the vertebrae in the neck (osteoarthritis).  Neck injury.  Disease of the disks that separate the vertebrae.  Tumors.  Gout.  Infections.  Diabetes.  Swollen blood vessels that put pressure on the occipital nerves.  Muscle spasm in the neck.  What are the signs or symptoms? Pain  is the main symptom of occipital neuralgia. It usually starts in the back of the head but may also be felt in other areas supplied by the occipital nerves. Pain is usually on one side but may be on both sides. You may have:  Brief episodes of very bad pain that is burning, stabbing, shocking, or shooting.  Pain behind the eye.  Pain triggered by neck movement or hair brushing.  Scalp tenderness.  Aching in the back of the head between episodes of very bad pain.  How is this diagnosed? Your health care provider may diagnose occipital neuralgia based on your symptoms and a physical exam. During the exam, the health care provider may push on areas supplied by the occipital nerves to see if they are painful. Some tests may also be done to help in making the diagnosis. These may include:  Imaging studies of the upper spinal cord, such as an MRI or CT scan. These may show compression or spinal cord abnormalities.  Nerve block. You will get an injection of numbing medicine (local anesthetic) near the occipital nerve to see if this relieves pain.  How is this treated? Treatment may begin with simple measures, such as:  Rest.  Massage.  Heat.  Over-the-counter pain relievers.  If these measures do not work, you may need other treatments, including:  Medicines such as: ? Prescription-strength anti-inflammatory medicines. ? Muscle relaxants. ? Antiseizure medicines. ? Antidepressants.  Steroid injection. This involves injections of local anesthetic and strong anti-inflammatory drugs (steroids).  Pulsed radiofrequency. Wires are implanted to  deliver electrical impulses that block pain signals from the occipital nerve.  Physical therapy.  Surgery to relieve nerve pressure.  Follow these instructions at home:  Take all medicines as directed by your health care provider.  Avoid activities that cause pain.  Rest when you have an attack of pain.  Try gentle massage or a heating  pad to relieve pain.  Work with a physical therapist to learn stretching exercises you can do at home.  Try a different pillow or sleeping position.  Practice good posture.  Try to stay active. Get regular exercise that does not cause pain. Ask your health care provider to suggest safe exercises for you.  Keep all follow-up visits as directed by your health care provider. This is important. Contact a health care provider if:  Your medicine is not working.  You have new or worsening symptoms. Get help right away if:  You have very bad head pain that is not going away.  You have a sudden change in vision, balance, or speech. This information is not intended to replace advice given to you by your health care provider. Make sure you discuss any questions you have with your health care provider. Document Released: 03/23/2001 Document Revised: 09/04/2015 Document Reviewed: 03/21/2013 Elsevier Interactive Patient Education  2017 [employer]

## 2017-10-06 ENCOUNTER — Encounter: Payer: Self-pay | Admitting: Neurology

## 2017-10-06 ENCOUNTER — Ambulatory Visit (INDEPENDENT_AMBULATORY_CARE_PROVIDER_SITE_OTHER): Payer: 59 | Admitting: Neurology

## 2017-10-06 VITALS — BP 114/80 | HR 96 | Ht 64.0 in | Wt 304.0 lb

## 2017-10-06 DIAGNOSIS — M5481 Occipital neuralgia: Secondary | ICD-10-CM

## 2017-10-06 NOTE — Addendum Note (Signed)
Addended by: Levert FeinsteinYAN, Cleophas Yoak on: 10/06/2017 09:53 AM   Modules accepted: Level of Service
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
## 2017-10-06 NOTE — Progress Notes (Signed)
37 year old female, presented with 576-month history of persistent right occipital area radiating pain to right skull, tenderness at right nuchal area, especially at the exit point of the right greater occipital nerve,  Nerve block was performed mixing   1.5 cc of 0.5 Marcaine with 1.5 cc of Celestone (6 mg/cc)  Injection was started at right lateral 1/3 of the line connecting inion and right mastoid process, which is also the most tenderness upon deep palpitation, spreading medially and laterally patient responded to the injection well at multiple injection sites  Patient responded to injection well

## 2017-10-08 ENCOUNTER — Other Ambulatory Visit: Payer: Self-pay | Admitting: Family Medicine

## 2017-10-12 ENCOUNTER — Ambulatory Visit (INDEPENDENT_AMBULATORY_CARE_PROVIDER_SITE_OTHER): Payer: 59 | Admitting: Family Medicine

## 2017-10-26 ENCOUNTER — Ambulatory Visit (INDEPENDENT_AMBULATORY_CARE_PROVIDER_SITE_OTHER): Payer: 59 | Admitting: Family Medicine

## 2017-10-26 ENCOUNTER — Other Ambulatory Visit: Payer: Self-pay | Admitting: Family Medicine

## 2017-10-26 VITALS — BP 126/88 | HR 94 | Temp 98.1°F | Ht 64.0 in | Wt 300.0 lb

## 2017-10-26 DIAGNOSIS — Z6841 Body Mass Index (BMI) 40.0 and over, adult: Secondary | ICD-10-CM

## 2017-10-26 DIAGNOSIS — Z9189 Other specified personal risk factors, not elsewhere classified: Secondary | ICD-10-CM

## 2017-10-26 DIAGNOSIS — E559 Vitamin D deficiency, unspecified: Secondary | ICD-10-CM | POA: Diagnosis not present

## 2017-10-26 DIAGNOSIS — E119 Type 2 diabetes mellitus without complications: Secondary | ICD-10-CM

## 2017-10-26 MED ORDER — INSULIN PEN NEEDLE 32G X 4 MM MISC: 1.0000 | Freq: Two times a day (BID) | 0 refills | Status: DC
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
## 2017-11-08 ENCOUNTER — Encounter: Payer: Self-pay | Admitting: Family Medicine

## 2017-11-08 ENCOUNTER — Ambulatory Visit (INDEPENDENT_AMBULATORY_CARE_PROVIDER_SITE_OTHER): Payer: 59 | Admitting: Family Medicine

## 2017-11-08 ENCOUNTER — Other Ambulatory Visit: Payer: Self-pay

## 2017-11-08 VITALS — BP 119/79 | HR 115 | Temp 98.9°F | Resp 17 | Ht 64.0 in | Wt 304.4 lb

## 2017-11-08 DIAGNOSIS — R110 Nausea: Secondary | ICD-10-CM | POA: Diagnosis not present

## 2017-11-08 DIAGNOSIS — R51 Headache: Secondary | ICD-10-CM | POA: Diagnosis not present

## 2017-11-08 DIAGNOSIS — R6883 Chills (without fever): Secondary | ICD-10-CM

## 2017-11-08 DIAGNOSIS — R5383 Other fatigue: Secondary | ICD-10-CM | POA: Diagnosis not present

## 2017-11-08 DIAGNOSIS — R7989 Other specified abnormal findings of blood chemistry: Secondary | ICD-10-CM

## 2017-11-08 DIAGNOSIS — J3489 Other specified disorders of nose and nasal sinuses: Secondary | ICD-10-CM

## 2017-11-08 DIAGNOSIS — R519 Headache, unspecified: Secondary | ICD-10-CM

## 2017-11-08 DIAGNOSIS — R000 Tachycardia, unspecified: Secondary | ICD-10-CM

## 2017-11-08 DIAGNOSIS — E119 Type 2 diabetes mellitus without complications: Secondary | ICD-10-CM | POA: Diagnosis not present

## 2017-11-08 LAB — POCT CBC
GRANULOCYTE PERCENT: 71.2 % (ref 37–80)
HCT, POC: 40 % (ref 37.7–47.9)
Hemoglobin: 12.1 g/dL — AB (ref 12.2–16.2)
Lymph, poc: 2.7 (ref 0.6–3.4)
MCH: 25.2 pg — AB (ref 27–31.2)
MCHC: 30.2 g/dL — AB (ref 31.8–35.4)
MCV: 83.5 fL (ref 80–97)
MID (cbc): 0.1 (ref 0–0.9)
MPV: 7.1 fL (ref 0–99.8)
POC Granulocyte: 6.9 (ref 2–6.9)
POC LYMPH %: 27.4 % (ref 10–50)
POC MID %: 1.4 % (ref 0–12)
Platelet Count, POC: 359 10*3/uL (ref 142–424)
RBC: 4.8 M/uL (ref 4.04–5.48)
RDW, POC: 15.8 %
WBC: 9.7 10*3/uL (ref 4.6–10.2)

## 2017-11-08 LAB — GLUCOSE, POCT (MANUAL RESULT ENTRY): POC Glucose: 155 mg/dl — AB (ref 70–99)
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
## 2017-11-08 NOTE — Patient Instructions (Addendum)
Blood count is reassuring in the office today, and blood sugar was only mildly elevated.  I did check a thyroid test as that was borderline elevated in the past and for your current fatigue, but unlikely contributing to current symptoms.  Headache may be related to multiple causes including sinus pressure, stress or tension headaches, occipital neuralgia, and migraine.  Based on your exam Meningitis is unlikely.  However based on the continued symptoms I would recommend CT head to look at sinuses to see if it is worth trying a another round of antibiotics but also to look for other causes of headache.   Okay to take your usual regimen for migraine headache at this time, zofran if needed for nausea for now,  rest, drink plenty of fluids, and recheck in the next 48 to 72 hours.  Return to the clinic or go to the nearest emergency room if any of your symptoms worsen or new symptoms occur.    General Headache Without Cause A headache is pain or discomfort felt around the head or neck area. There are many causes and types of headaches. In some cases, the cause may not be found. Follow these instructions at home: Managing pain  Take over-the-counter and prescription medicines only as told by your doctor.  Lie down in a dark, quiet room when you have a headache.  If directed, apply ice to the head and neck area: ? Put ice in a plastic bag. ? Place a towel between your skin and the bag. ? Leave the ice on for 20 minutes, 2-3 times per day.  Use a heating pad or hot shower to apply heat to the head and neck area as told by your doctor.  Keep lights dim if bright lights bother you or make your headaches worse. Eating and drinking  Eat meals on a regular schedule.  Lessen how much alcohol you drink.  Lessen how much caffeine you drink, or stop drinking caffeine. General instructions  Keep all follow-up visits as told by your doctor. This is important.  Keep a journal to find out if certain  things bring on headaches. For example, write down: ? What you eat and drink. ? How much sleep you get. ? Any change to your diet or medicines.  Relax by getting a massage or doing other relaxing activities.  Lessen stress.  Sit up straight. Do not tighten (tense) your muscles.  Do not use tobacco products. This includes cigarettes, chewing tobacco, or e-cigarettes. If you need help quitting, ask your doctor.  Exercise regularly as told by your doctor.  Get enough sleep. This often means 7-9 hours of sleep. Contact a doctor if:  Your symptoms are not helped by medicine.  You have a headache that feels different than the other headaches.  You feel sick to your stomach (nauseous) or you throw up (vomit).  You have a fever. Get help right away if:  Your headache becomes really bad.  You keep throwing up.  You have a stiff neck.  You have trouble seeing.  You have trouble speaking.  You have pain in the eye or ear.  Your muscles are weak or you lose muscle control.  You lose your balance or have trouble walking.  You feel like you will pass out (faint) or you pass out.  You have confusion. This information is not intended to replace advice given to you by your health care provider. Make sure you discuss any questions you have with your health care  provider. Document Released: 01/06/2008 Document Revised: 09/04/2015 Document Reviewed: 07/22/2014 Elsevier Interactive Patient Education  2018 [employer].      IF you received an x-ray today, you will receive an invoice from [facility]. Please contact [facility] at [phone] with questions or concerns regarding your invoice.   IF you received labwork today, you will receive an invoice from [place]. Please contact [facility] at [phone] with questions or concerns regarding your invoice.   Our billing staff will not be able to assist you with questions regarding bills from these  companies.  You will be contacted with the lab results as soon as they are available. The fastest way to get your results is to activate your My Chart account. Instructions are located on the last page of this paperwork. If you have not heard from [name] regarding the results in 2 weeks, please contact this office.

## 2017-11-09 ENCOUNTER — Encounter (HOSPITAL_COMMUNITY): Payer: Self-pay | Admitting: Emergency Medicine

## 2017-11-09 ENCOUNTER — Other Ambulatory Visit: Payer: Self-pay

## 2017-11-09 ENCOUNTER — Emergency Department (HOSPITAL_COMMUNITY)
Admission: EM | Admit: 2017-11-09 | Discharge: 2017-11-10 | Disposition: A | Discharge status: Home / Self Care | Payer: 59 | Attending: Emergency Medicine | Admitting: Emergency Medicine

## 2017-11-09 DIAGNOSIS — J3489 Other specified disorders of nose and nasal sinuses: Secondary | ICD-10-CM | POA: Diagnosis not present

## 2017-11-09 DIAGNOSIS — Z7984 Long term (current) use of oral hypoglycemic drugs: Secondary | ICD-10-CM | POA: Diagnosis not present

## 2017-11-09 DIAGNOSIS — R531 Weakness: Secondary | ICD-10-CM | POA: Diagnosis not present

## 2017-11-09 DIAGNOSIS — Z91040 Latex allergy status: Secondary | ICD-10-CM | POA: Insufficient documentation

## 2017-11-09 DIAGNOSIS — E119 Type 2 diabetes mellitus without complications: Secondary | ICD-10-CM | POA: Diagnosis not present

## 2017-11-09 DIAGNOSIS — Z79899 Other long term (current) drug therapy: Secondary | ICD-10-CM | POA: Diagnosis not present

## 2017-11-09 DIAGNOSIS — R42 Dizziness and giddiness: Secondary | ICD-10-CM | POA: Diagnosis present

## 2017-11-09 LAB — CBC WITH DIFFERENTIAL/PLATELET
BASOS ABS: 0 10*3/uL (ref 0.0–0.1)
Basophils Relative: 0 %
EOS ABS: 0.2 10*3/uL (ref 0.0–0.7)
EOS PCT: 2 %
HCT: 40.7 % (ref 36.0–46.0)
HEMOGLOBIN: 12.8 g/dL (ref 12.0–15.0)
Lymphocytes Relative: 26 %
Lymphs Abs: 2.2 10*3/uL (ref 0.7–4.0)
MCH: 27 pg (ref 26.0–34.0)
MCHC: 31.4 g/dL (ref 30.0–36.0)
MCV: 85.9 fL (ref 78.0–100.0)
Monocytes Absolute: 0.3 10*3/uL (ref 0.1–1.0)
Monocytes Relative: 3 %
NEUTROS PCT: 69 %
Neutro Abs: 6.1 10*3/uL (ref 1.7–7.7)
PLATELETS: 357 10*3/uL (ref 150–400)
RBC: 4.74 MIL/uL (ref 3.87–5.11)
RDW: 15.2 % (ref 11.5–15.5)
WBC: 8.7 10*3/uL (ref 4.0–10.5)

## 2017-11-09 LAB — BASIC METABOLIC PANEL
ANION GAP: 10 (ref 5–15)
BUN: 14 mg/dL (ref 6–20)
CHLORIDE: 107 mmol/L (ref 98–111)
CO2: 27 mmol/L (ref 22–32)
Calcium: 9.1 mg/dL (ref 8.9–10.3)
Creatinine, Ser: 0.75 mg/dL (ref 0.44–1.00)
Glucose, Bld: 129 mg/dL — ABNORMAL HIGH (ref 70–99)
POTASSIUM: 4.3 mmol/L (ref 3.5–5.1)
SODIUM: 144 mmol/L (ref 135–145)

## 2017-11-09 LAB — TSH: TSH: 3.44 u[IU]/mL (ref 0.450–4.500)
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
## 2017-11-09 NOTE — ED Notes (Addendum)
Pt says she is not feeling right in her head, said neuro gave her a nerve block from dr. Terrace Arabiayan about a month or two ago. Muscle was pinching a nerve on her occipital. Polypharmacy and not taking migraine meds says she lost them.

## 2017-11-09 NOTE — ED Triage Notes (Signed)
Pt arriving with sinus infection symptoms. Complaining of generalized weakness and dizziness. Pt states  "I can't think like I normally do". Reports dizziness when trying to move her head and difficulty focusing on anything she tries to read. Pt reports being seen by doctor yesterday who suggested she have a CT scan soon. Pt requesting CT scan today.

## 2017-11-10 ENCOUNTER — Encounter (HOSPITAL_COMMUNITY): Payer: Self-pay | Admitting: *Deleted

## 2017-11-10 ENCOUNTER — Emergency Department (HOSPITAL_COMMUNITY): Payer: 59

## 2017-11-10 ENCOUNTER — Encounter (INDEPENDENT_AMBULATORY_CARE_PROVIDER_SITE_OTHER): Payer: Self-pay | Admitting: Family Medicine

## 2017-11-10 MED ORDER — DEXAMETHASONE SODIUM PHOSPHATE 10 MG/ML IJ SOLN
10.0000 mg | Freq: Once | INTRAMUSCULAR | Status: AC
  Administered 2017-11-10: 10 mg via INTRAMUSCULAR
  Filled 2017-11-10: qty 1

## 2017-11-10 NOTE — Discharge Instructions (Addendum)
Monitor your blood sugars closely after receiving steroids here today. Follow up with your doctor for recheck at next available appointment. REturn here as needed for new or concerning symptoms.

## 2017-11-11 NOTE — Telephone Encounter (Signed)
Called and scheduled pt with Dr. Clelia CroftShaw on 11/21/17 at 11:40 - advised of building number, time and late policy.

## 2017-11-15 ENCOUNTER — Encounter: Payer: Self-pay | Admitting: Family Medicine

## 2017-11-16 ENCOUNTER — Ambulatory Visit (INDEPENDENT_AMBULATORY_CARE_PROVIDER_SITE_OTHER): Payer: 59 | Admitting: Physician Assistant

## 2017-11-16 ENCOUNTER — Other Ambulatory Visit (INDEPENDENT_AMBULATORY_CARE_PROVIDER_SITE_OTHER): Payer: Self-pay | Admitting: Physician Assistant

## 2017-11-16 VITALS — BP 112/78 | HR 84 | Temp 97.7°F | Ht 64.0 in | Wt 295.0 lb

## 2017-11-16 DIAGNOSIS — E119 Type 2 diabetes mellitus without complications: Secondary | ICD-10-CM | POA: Diagnosis not present

## 2017-11-16 DIAGNOSIS — E559 Vitamin D deficiency, unspecified: Secondary | ICD-10-CM | POA: Diagnosis not present

## 2017-11-16 DIAGNOSIS — E782 Mixed hyperlipidemia: Secondary | ICD-10-CM | POA: Diagnosis not present

## 2017-11-16 DIAGNOSIS — Z9189 Other specified personal risk factors, not elsewhere classified: Secondary | ICD-10-CM | POA: Diagnosis not present

## 2017-11-16 DIAGNOSIS — Z6841 Body Mass Index (BMI) 40.0 and over, adult: Secondary | ICD-10-CM

## 2017-11-16 MED ORDER — VITAMIN D (ERGOCALCIFEROL) 1.25 MG (50000 UNIT) PO CAPS: 50000.0000 [IU] | ORAL_CAPSULE | ORAL | 0 refills | Status: DC

## 2017-11-17 ENCOUNTER — Other Ambulatory Visit: Payer: Self-pay

## 2017-11-17 LAB — INSULIN, RANDOM: INSULIN: 26.3 u[IU]/mL — AB (ref 2.6–24.9)

## 2017-11-17 LAB — VITAMIN D 25 HYDROXY (VIT D DEFICIENCY, FRACTURES): Vit D, 25-Hydroxy: 25.5 ng/mL — ABNORMAL LOW (ref 30.0–100.0)

## 2017-11-17 LAB — HEMOGLOBIN A1C
Est. average glucose Bld gHb Est-mCnc: 194 mg/dL
HEMOGLOBIN A1C: 8.4 % — AB (ref 4.8–5.6)

## 2017-11-17 LAB — LIPID PANEL WITH LDL/HDL RATIO
Cholesterol, Total: 213 mg/dL — ABNORMAL HIGH (ref 100–199)
HDL: 71 mg/dL (ref >39)
LDL CALC: 105 mg/dL — AB (ref 0–99)
LDL/HDL RATIO: 1.5 ratio (ref 0.0–3.2)
Triglycerides: 186 mg/dL — ABNORMAL HIGH (ref 0–149)
VLDL CHOLESTEROL CAL: 37 mg/dL (ref 5–40)
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
## 2017-11-18 ENCOUNTER — Encounter: Payer: Self-pay | Admitting: Family Medicine

## 2017-11-21 ENCOUNTER — Ambulatory Visit (INDEPENDENT_AMBULATORY_CARE_PROVIDER_SITE_OTHER): Payer: 59 | Admitting: Family Medicine

## 2017-11-21 ENCOUNTER — Encounter: Payer: Self-pay | Admitting: Family Medicine

## 2017-11-21 VITALS — BP 128/82 | HR 118 | Temp 98.9°F | Resp 16 | Ht 64.0 in | Wt 301.2 lb

## 2017-11-21 DIAGNOSIS — M797 Fibromyalgia: Secondary | ICD-10-CM | POA: Diagnosis not present

## 2017-11-21 DIAGNOSIS — E1165 Type 2 diabetes mellitus with hyperglycemia: Secondary | ICD-10-CM

## 2017-11-21 DIAGNOSIS — F418 Other specified anxiety disorders: Secondary | ICD-10-CM | POA: Diagnosis not present

## 2017-11-21 MED ORDER — GABAPENTIN 400 MG PO CAPS: ORAL_CAPSULE | ORAL | 1 refills | Status: DC

## 2017-11-21 NOTE — Patient Instructions (Addendum)
You need to go back swimming tomorrow. 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 Look up [employer] program to see if you could take 1 class in the morning.    IF you received an x-ray today, [facility] will [facility]. Please contact [facility] at [phone] with questions or concerns regarding your invoice.   IF you received labwork today, you will receive an invoice from [facility]. Please contact [facility] at [phone] with questions or concerns regarding your invoice.   Our billing staff will not be able to assist you with questions regarding bills from these companies.  You will be contacted with the lab results as soon as they are available. The fastest way to get your results is to activate your My Chart account. Instructions are located on the last page of this paperwork. If you have not heard from us regarding the results in 2 weeks, please contact this office.     Myofascial Pain Syndrome and Fibromyalgia Myofascial pain syndrome and fibromyalgia are both pain disorders. This pain may be felt mainly in your muscles.  Myofascial pain syndrome: ? Always has trigger points or tender points in the muscle that will cause pain when pressed. The pain may come and go. ? Usually affects your neck, upper back, and shoulder areas. The pain often radiates into your arms and hands.  Fibromyalgia: ? Has muscle pains and tenderness that come and go. ? Is often associated with fatigue and sleep disturbances. ? Has trigger points. ? Tends to be long-lasting (chronic), but is not life-threatening.  Fibromyalgia and myofascial pain are not the same. However, they often occur together. If you have both conditions, each can make the other worse. Both are common and can cause enough pain and fatigue to make day-to-day activities difficult. What are the causes? The exact causes of fibromyalgia and myofascial pain are  not known. People with certain gene types may be more likely to develop fibromyalgia. Some factors can be triggers for both conditions, such as:  Spine disorders.  Arthritis.  Severe injury (trauma) and other physical stressors.  Being under a lot of stress.  A medical illness.  What are the signs or symptoms? Fibromyalgia The main symptom of fibromyalgia is widespread pain and tenderness in your muscles. This can vary over time. Pain is sometimes described as stabbing, shooting, or burning. You may have tingling or numbness, too. You may also have sleep problems and fatigue. You may wake up feeling tired and groggy (fibro fog). Other symptoms may include:  Bowel and bladder problems.  Headaches.  Visual problems.  Problems with odors and noises.  Depression or mood changes.  Painful menstrual periods (dysmenorrhea).  Dry skin or eyes.  Myofascial pain syndrome Symptoms of myofascial pain syndrome include:  Tight, ropy bands of muscle.  Uncomfortable sensations in muscular areas, such as: ? Aching. ? Cramping. ? Burning. ? Numbness. ? Tingling. ? Muscle weakness.  Trouble moving certain muscles freely (range of motion).  How is this diagnosed? There are no specific tests to diagnose fibromyalgia or myofascial pain syndrome. Both can be hard to diagnose because their symptoms are common in many other conditions. Your health care provider may suspect one or both of these conditions based on your symptoms and medical history. Your health care provider will also do a physical exam. The key to diagnosing fibromyalgia is having pain, fatigue, and other symptoms for more than three months  that cannot be explained by another condition. The key to diagnosing myofascial pain syndrome is finding trigger points in muscles that are tender and cause pain elsewhere in your body (referred pain). How is this treated? Treating fibromyalgia and myofascial pain often requires a team of  health care providers. This usually starts with your primary provider and a physical therapist. You may also find it helpful to work with alternative health care providers, such as massage therapists or acupuncturists. Treatment for fibromyalgia may include medicines. This may include nonsteroidal anti-inflammatory drugs (NSAIDs), along with other medicines. Treatment for myofascial pain may also include:  NSAIDs.  Cooling and stretching of muscles.  Trigger point injections.  Sound wave (ultrasound) treatments to stimulate muscles.  Follow these instructions at home:  Take medicines only as directed by your health care provider.  Exercise as directed by your health care provider or physical therapist.  Try to avoid stressful situations.  Practice relaxation techniques to control your stress. You may want to try: ? Biofeedback. ? Visual imagery. ? Hypnosis. ? Muscle relaxation. ? Yoga. ? Meditation.  Talk to your health care provider about alternative treatments, such as acupuncture or massage treatment.  Maintain a healthy lifestyle. This includes eating a healthy diet and getting enough sleep.  Consider joining a support group.  Do not do activities that stress or strain your muscles. That includes repetitive motions and heavy lifting. Where to find more information:  National Fibromyalgia Association: www.fmaware.org  Arthritis Foundation: www.arthritis.org  American Chronic Pain Association: GumSearch.nlwww.theacpa.org/condition/myofascial-pain Contact a health care provider if:  You have new symptoms.  Your symptoms get worse.  You have side effects from your medicines.  You have trouble sleeping.  Your condition is causing depression or anxiety. This information is not intended to replace advice given to you by your health care provider. Make sure you discuss any questions you have with your health care provider. Document Released: 03/29/2005 Document Revised:  09/04/2015 Document Reviewed: 01/02/2014 Elsevier Interactive Patient Education  [employer]2018 [employer]
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
## 2017-11-25 ENCOUNTER — Ambulatory Visit (INDEPENDENT_AMBULATORY_CARE_PROVIDER_SITE_OTHER): Payer: 59 | Admitting: Emergency Medicine

## 2017-11-25 ENCOUNTER — Encounter: Payer: Self-pay | Admitting: Emergency Medicine

## 2017-11-25 ENCOUNTER — Other Ambulatory Visit: Payer: Self-pay

## 2017-11-25 VITALS — BP 117/86 | HR 111 | Temp 98.6°F | Resp 16 | Ht 64.96 in | Wt 305.0 lb

## 2017-11-25 DIAGNOSIS — R110 Nausea: Secondary | ICD-10-CM | POA: Diagnosis not present

## 2017-11-25 DIAGNOSIS — R197 Diarrhea, unspecified: Secondary | ICD-10-CM | POA: Diagnosis not present

## 2017-11-25 DIAGNOSIS — K529 Noninfective gastroenteritis and colitis, unspecified: Secondary | ICD-10-CM | POA: Diagnosis not present

## 2017-11-25 MED ORDER — ONDANSETRON HCL 4 MG PO TABS: 4.0000 mg | ORAL_TABLET | Freq: Three times a day (TID) | ORAL | 0 refills | Status: DC | PRN

## 2017-11-25 NOTE — Patient Instructions (Addendum)
   If you have lab work done today you will be contacted with your lab results within the next 2 weeks.  If you have not heard from us then please contact us. The fastest way to get your results is to register for My Chart.   IF you received an x-ray today, you will receive an invoice from [facility]. Please contact [facility] at [phone] with questions or concerns regarding your invoice.   IF you received labwork today, you will receive an invoice from [facility]. Please contact [facility] at [phone] with questions or concerns regarding your invoice.   Our billing staff will not be able to assist you with questions regarding bills from these companies.  You will be contacted with the lab results as soon as they are available. The fastest way to get your results is to activate your My Chart account. Instructions are located on the last page of this paperwork. If you have not heard from us regarding the results in 2 weeks, please contact this office.     Diarrhea, Adult Diarrhea is when you have loose and water poop (stool) often. Diarrhea can make you feel weak and cause you to get dehydrated. Dehydration can make you tired and thirsty, make you have a dry mouth, and make it so you pee (urinate) less often. Diarrhea often lasts 2-3 days. However, it can last longer if it is a sign of something more serious. It is important to treat your diarrhea as told by your doctor. Follow these instructions at home: Eating and drinking  Follow these recommendations as told by your doctor:  Take an oral rehydration solution (ORS). This is a drink that is sold at pharmacies and stores.  Drink clear fluids, such as: ? Water. ? Ice chips. ? Diluted fruit juice. ? Low-calorie sports drinks.  Eat bland, easy-to-digest foods in small amounts as you are able. These foods include: ? Bananas. ? Applesauce. ? Rice. ? Low-fat (lean) meats. ? Toast. ? Crackers.  Avoid drinking  fluids that have a lot of sugar or caffeine in them.  Avoid alcohol.  Avoid spicy or fatty foods.  General instructions   Drink enough fluid to keep your pee (urine) clear or pale yellow.  Wash your hands often. If you cannot use soap and water, use hand sanitizer.  Make sure that all people in your home wash their hands well and often.  Take over-the-counter and prescription medicines only as told by your doctor.  Rest at home while you get better.  Watch your condition for any changes.  Take a warm bath to help with any burning or pain from having diarrhea.  Keep all follow-up visits as told by your doctor. This is important. Contact a doctor if:  You have a fever.  Your diarrhea gets worse.  You have new symptoms.  You cannot keep fluids down.  You feel light-headed or dizzy.  You have a headache.  You have muscle cramps. Get help right away if:  You have chest pain.  You feel very weak or you pass out (faint).  You have bloody or black poop or poop that look like tar.  You have very bad pain, cramping, or bloating in your belly (abdomen).  You have trouble breathing or you are breathing very quickly.  Your heart is beating very quickly.  Your skin feels cold and clammy.  You feel confused.  You have signs of dehydration, such as: ? Dark pee, hardly any pee, or   no pee. ? Cracked lips. ? Dry mouth. ? Sunken eyes. ? Sleepiness. ? Weakness. This information is not intended to replace advice given to you by your health care provider. Make sure you discuss any questions you have with your health care provider. Document Released: 09/15/2007 Document Revised: 10/17/2015 Document Reviewed: 12/03/2014 Elsevier Interactive Patient Education  2018 [employer]  
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
## 2017-12-07 ENCOUNTER — Ambulatory Visit (INDEPENDENT_AMBULATORY_CARE_PROVIDER_SITE_OTHER): Payer: 59 | Admitting: Family Medicine

## 2017-12-07 VITALS — BP 103/70 | HR 96 | Temp 98.0°F | Ht 64.0 in | Wt 301.0 lb

## 2017-12-07 DIAGNOSIS — E119 Type 2 diabetes mellitus without complications: Secondary | ICD-10-CM | POA: Diagnosis not present

## 2017-12-07 DIAGNOSIS — Z6841 Body Mass Index (BMI) 40.0 and over, adult: Secondary | ICD-10-CM | POA: Diagnosis not present

## 2017-12-07 DIAGNOSIS — G8929 Other chronic pain: Secondary | ICD-10-CM | POA: Diagnosis not present
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
## 2017-12-13 ENCOUNTER — Encounter (INDEPENDENT_AMBULATORY_CARE_PROVIDER_SITE_OTHER): Payer: Self-pay | Admitting: Family Medicine

## 2017-12-15 ENCOUNTER — Other Ambulatory Visit: Payer: Self-pay | Admitting: Family Medicine

## 2017-12-20 ENCOUNTER — Other Ambulatory Visit: Payer: Self-pay | Admitting: Family Medicine

## 2017-12-22 ENCOUNTER — Ambulatory Visit (INDEPENDENT_AMBULATORY_CARE_PROVIDER_SITE_OTHER): Payer: 59 | Admitting: Family Medicine

## 2017-12-22 ENCOUNTER — Encounter (INDEPENDENT_AMBULATORY_CARE_PROVIDER_SITE_OTHER): Payer: Self-pay | Admitting: Family Medicine

## 2017-12-22 VITALS — BP 103/69 | HR 88 | Temp 97.4°F | Ht 64.0 in | Wt 309.0 lb

## 2017-12-22 DIAGNOSIS — Z6841 Body Mass Index (BMI) 40.0 and over, adult: Secondary | ICD-10-CM

## 2017-12-22 DIAGNOSIS — E119 Type 2 diabetes mellitus without complications: Secondary | ICD-10-CM | POA: Diagnosis not present

## 2017-12-22 DIAGNOSIS — F3289 Other specified depressive episodes: Secondary | ICD-10-CM
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
## 2017-12-29 ENCOUNTER — Ambulatory Visit: Payer: Self-pay

## 2017-12-29 ENCOUNTER — Encounter: Payer: Self-pay | Admitting: Family Medicine

## 2017-12-29 ENCOUNTER — Encounter (INDEPENDENT_AMBULATORY_CARE_PROVIDER_SITE_OTHER): Payer: Self-pay | Admitting: Family Medicine

## 2017-12-29 NOTE — Telephone Encounter (Signed)
Patient called in with c/o "elevated blood sugar." She says "I had a lot of rice with chicken for lunch and decided to check my blood sugar and it was 333, then 415, 334, 355 all within the same minute. I wonder if lotion affects it." I asked her to check it again and use alcohol to clean her finger, then wipe off and squeeze out blood again, she says "I'm here at work and ran out of test strips. Maybe it's the test strips giving me that reading, I don't know." I asked about symptoms of high blood sugar or any symptoms, she denies. I asked how often does she check her blood sugar, she says "I don't usually, so I don't know what it usually runs." I asked has she missed doses of her medication, she says "yes, last night I did because I have a migraine and I usually don't take medicine when I have a migraine." I advised the patient to take her medication as ordered, especially when eating. I advised to check her blood sugar at home tonight, then again in the morning before she eats and to call the office to schedule an appointment if it is still reading higher levels above 250-300's, she verbalized understanding.   Reason for Disposition . [1] Blood glucose > 300 mg/dL (91.416.7 mmol/L) AND [7][2] two or more times in a row  Answer Assessment - Initial Assessment Questions 1. BLOOD GLUCOSE: "What is your blood glucose level?"      355 2. ONSET: "When did you check the blood glucose?"     About 1 hour ago 3. USUAL RANGE: "What is your glucose level usually?" (e.g., usual fasting morning value, usual evening value)     I don't check it 4. KETONES: "Do you check for ketones (urine or blood test strips)?" If yes, ask: "What does the test show now?"      No 5. TYPE 1 or 2:  "Do you know what type of diabetes you have?"  (e.g., Type 1, Type 2, Gestational; doesn't know)      Type 2 6. INSULIN: "Do you take insulin?" "What type of insulin(s) do you use? What is the mode of delivery? (syringe, pen (e.g., injection or   pump)?"      Byetta shot BID 7. DIABETES PILLS: "Do you take any pills for your diabetes?" If yes, ask: "Have you missed taking any pills recently?"     Yes, missed dose Metformin and Byetta yesterday 8. OTHER SYMPTOMS: "Do you have any symptoms?" (e.g., fever, frequent urination, difficulty breathing, dizziness, weakness, vomiting)     No 9. PREGNANCY: "Is there any chance you are pregnant?" "When was your last menstrual period?"     No  Protocols used: DIABETES - HIGH BLOOD SUGAR-A-AH

## 2018-01-03 NOTE — Telephone Encounter (Signed)
Pt called and stated she upload new FMLA paper work for Fibromyalgia.  She stated her job is requiring her to have paperwork filled out.  She is aware that Dr Clelia Croftshaw is out of the office right not and is concerned about this being completed.  She stated this will have to be done by due date in order to keep her job   Best number  [phone] Fax number for paperwork to be sent to -  [phone] Due date for for fmla paperwork is OCT 3

## 2018-01-05 ENCOUNTER — Encounter (INDEPENDENT_AMBULATORY_CARE_PROVIDER_SITE_OTHER): Payer: Self-pay | Admitting: Family Medicine

## 2018-01-06 ENCOUNTER — Encounter: Payer: Self-pay | Admitting: Family Medicine

## 2018-01-06 MED ORDER — FREESTYLE LIBRE 14 DAY READER DEVI: 1.0000 [IU] | 0 refills | Status: DC | PRN

## 2018-01-06 MED ORDER — FREESTYLE LIBRE 14 DAY SENSOR MISC: 1.0000 [IU] | 3 refills | Status: DC

## 2018-01-13 ENCOUNTER — Other Ambulatory Visit (INDEPENDENT_AMBULATORY_CARE_PROVIDER_SITE_OTHER): Payer: Self-pay | Admitting: Family Medicine

## 2018-01-13 DIAGNOSIS — E119 Type 2 diabetes mellitus without complications: Secondary | ICD-10-CM

## 2018-01-17 ENCOUNTER — Encounter (INDEPENDENT_AMBULATORY_CARE_PROVIDER_SITE_OTHER): Payer: Self-pay | Admitting: Family Medicine

## 2018-01-17 ENCOUNTER — Other Ambulatory Visit (INDEPENDENT_AMBULATORY_CARE_PROVIDER_SITE_OTHER): Payer: Self-pay

## 2018-01-17 DIAGNOSIS — E119 Type 2 diabetes mellitus without complications: Secondary | ICD-10-CM

## 2018-01-17 MED ORDER — INSULIN PEN NEEDLE 32G X 4 MM MISC: 1.0000 | Freq: Two times a day (BID) | 0 refills | Status: DC

## 2018-01-29 ENCOUNTER — Other Ambulatory Visit: Payer: Self-pay | Admitting: Family Medicine

## 2018-01-31 ENCOUNTER — Other Ambulatory Visit: Payer: Self-pay | Admitting: Family Medicine

## 2018-02-06 ENCOUNTER — Ambulatory Visit (INDEPENDENT_AMBULATORY_CARE_PROVIDER_SITE_OTHER): Payer: 59 | Admitting: Family Medicine

## 2018-02-06 VITALS — BP 130/82 | HR 104 | Temp 98.3°F | Ht 64.0 in | Wt 300.0 lb

## 2018-02-06 DIAGNOSIS — Z6841 Body Mass Index (BMI) 40.0 and over, adult: Secondary | ICD-10-CM | POA: Diagnosis not present

## 2018-02-06 DIAGNOSIS — E119 Type 2 diabetes mellitus without complications: Secondary | ICD-10-CM

## 2018-02-06 DIAGNOSIS — Z9189 Other specified personal risk factors, not elsewhere classified: Secondary | ICD-10-CM

## 2018-02-06 DIAGNOSIS — E559 Vitamin D deficiency, unspecified: Secondary | ICD-10-CM | POA: Diagnosis not present

## 2018-02-06 MED ORDER — VITAMIN D (ERGOCALCIFEROL) 1.25 MG (50000 UNIT) PO CAPS: 50000.0000 [IU] | ORAL_CAPSULE | ORAL | 0 refills | Status: DC
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
## 2018-02-24 ENCOUNTER — Encounter (INDEPENDENT_AMBULATORY_CARE_PROVIDER_SITE_OTHER): Payer: Self-pay | Admitting: Family Medicine

## 2018-02-27 NOTE — Telephone Encounter (Signed)
Please address, thx!

## 2018-03-01 ENCOUNTER — Encounter (INDEPENDENT_AMBULATORY_CARE_PROVIDER_SITE_OTHER): Payer: Self-pay

## 2018-03-01 ENCOUNTER — Ambulatory Visit (INDEPENDENT_AMBULATORY_CARE_PROVIDER_SITE_OTHER): Payer: 59 | Admitting: Family Medicine

## 2018-03-05 ENCOUNTER — Other Ambulatory Visit (INDEPENDENT_AMBULATORY_CARE_PROVIDER_SITE_OTHER): Payer: Self-pay | Admitting: Family Medicine

## 2018-03-05 DIAGNOSIS — E559 Vitamin D deficiency, unspecified: Secondary | ICD-10-CM

## 2018-03-07 ENCOUNTER — Ambulatory Visit (INDEPENDENT_AMBULATORY_CARE_PROVIDER_SITE_OTHER): Payer: 59 | Admitting: Family Medicine

## 2018-03-07 VITALS — BP 113/76 | HR 92 | Temp 98.7°F | Ht 64.0 in | Wt 298.0 lb

## 2018-03-07 DIAGNOSIS — Z9189 Other specified personal risk factors, not elsewhere classified: Secondary | ICD-10-CM | POA: Diagnosis not present

## 2018-03-07 DIAGNOSIS — E559 Vitamin D deficiency, unspecified: Secondary | ICD-10-CM | POA: Diagnosis not present

## 2018-03-07 DIAGNOSIS — E1165 Type 2 diabetes mellitus with hyperglycemia: Secondary | ICD-10-CM | POA: Diagnosis not present

## 2018-03-07 DIAGNOSIS — Z6841 Body Mass Index (BMI) 40.0 and over, adult: Secondary | ICD-10-CM

## 2018-03-07 MED ORDER — VITAMIN D (ERGOCALCIFEROL) 1.25 MG (50000 UNIT) PO CAPS: 50000.0000 [IU] | ORAL_CAPSULE | ORAL | 0 refills | Status: DC
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
## 2018-03-22 ENCOUNTER — Encounter (INDEPENDENT_AMBULATORY_CARE_PROVIDER_SITE_OTHER): Payer: Self-pay | Admitting: Family Medicine

## 2018-03-23 ENCOUNTER — Ambulatory Visit (INDEPENDENT_AMBULATORY_CARE_PROVIDER_SITE_OTHER): Payer: 59 | Admitting: Family Medicine

## 2018-03-23 ENCOUNTER — Encounter (INDEPENDENT_AMBULATORY_CARE_PROVIDER_SITE_OTHER): Payer: Self-pay | Admitting: Family Medicine

## 2018-03-23 VITALS — BP 108/73 | HR 61 | Temp 98.3°F | Ht 64.0 in | Wt 295.0 lb

## 2018-03-23 DIAGNOSIS — E559 Vitamin D deficiency, unspecified: Secondary | ICD-10-CM | POA: Diagnosis not present

## 2018-03-23 DIAGNOSIS — Z9189 Other specified personal risk factors, not elsewhere classified: Secondary | ICD-10-CM | POA: Diagnosis not present

## 2018-03-23 DIAGNOSIS — Z6841 Body Mass Index (BMI) 40.0 and over, adult: Secondary | ICD-10-CM

## 2018-03-23 DIAGNOSIS — E1165 Type 2 diabetes mellitus with hyperglycemia: Secondary | ICD-10-CM | POA: Diagnosis not present

## 2018-03-23 MED ORDER — INSULIN PEN NEEDLE 32G X 4 MM MISC: 1.0000 | Freq: Two times a day (BID) | 0 refills | Status: DC
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
## 2018-04-13 ENCOUNTER — Ambulatory Visit (INDEPENDENT_AMBULATORY_CARE_PROVIDER_SITE_OTHER): Payer: 59 | Admitting: Family Medicine

## 2018-04-13 ENCOUNTER — Encounter (INDEPENDENT_AMBULATORY_CARE_PROVIDER_SITE_OTHER): Payer: Self-pay | Admitting: Family Medicine

## 2018-04-13 ENCOUNTER — Other Ambulatory Visit (INDEPENDENT_AMBULATORY_CARE_PROVIDER_SITE_OTHER): Payer: Self-pay | Admitting: Family Medicine

## 2018-04-13 VITALS — BP 106/74 | HR 105 | Temp 97.8°F | Ht 64.0 in | Wt 294.0 lb

## 2018-04-13 DIAGNOSIS — E119 Type 2 diabetes mellitus without complications: Secondary | ICD-10-CM | POA: Diagnosis not present

## 2018-04-13 DIAGNOSIS — E559 Vitamin D deficiency, unspecified: Secondary | ICD-10-CM

## 2018-04-13 DIAGNOSIS — Z6841 Body Mass Index (BMI) 40.0 and over, adult: Secondary | ICD-10-CM | POA: Diagnosis not present
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
## 2018-04-19 ENCOUNTER — Encounter (INDEPENDENT_AMBULATORY_CARE_PROVIDER_SITE_OTHER): Payer: Self-pay | Admitting: Family Medicine

## 2018-04-19 DIAGNOSIS — M47812 Spondylosis without myelopathy or radiculopathy, cervical region: Secondary | ICD-10-CM | POA: Insufficient documentation

## 2018-04-19 DIAGNOSIS — M47816 Spondylosis without myelopathy or radiculopathy, lumbar region: Secondary | ICD-10-CM | POA: Insufficient documentation

## 2018-04-19 DIAGNOSIS — M5459 Other low back pain: Secondary | ICD-10-CM | POA: Insufficient documentation

## 2018-04-25 ENCOUNTER — Encounter (INDEPENDENT_AMBULATORY_CARE_PROVIDER_SITE_OTHER): Payer: Self-pay | Admitting: Family Medicine

## 2018-04-25 ENCOUNTER — Ambulatory Visit (INDEPENDENT_AMBULATORY_CARE_PROVIDER_SITE_OTHER): Payer: 59 | Admitting: Family Medicine

## 2018-04-25 VITALS — BP 130/81 | HR 92 | Temp 97.6°F | Ht 64.0 in | Wt 290.0 lb

## 2018-04-25 DIAGNOSIS — E559 Vitamin D deficiency, unspecified: Secondary | ICD-10-CM

## 2018-04-25 DIAGNOSIS — Z6841 Body Mass Index (BMI) 40.0 and over, adult: Secondary | ICD-10-CM

## 2018-04-25 DIAGNOSIS — E1165 Type 2 diabetes mellitus with hyperglycemia: Secondary | ICD-10-CM | POA: Diagnosis not present

## 2018-04-25 DIAGNOSIS — Z9189 Other specified personal risk factors, not elsewhere classified: Secondary | ICD-10-CM

## 2018-04-25 DIAGNOSIS — Z794 Long term (current) use of insulin: Secondary | ICD-10-CM

## 2018-04-25 MED ORDER — SEMAGLUTIDE 3 MG PO TABS: 3.0000 mg | ORAL_TABLET | Freq: Every day | ORAL | 0 refills | Status: DC
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
## 2018-04-27 ENCOUNTER — Other Ambulatory Visit: Payer: Self-pay | Admitting: Family Medicine

## 2018-04-27 NOTE — Telephone Encounter (Signed)
Requested Prescriptions  Pending Prescriptions Disp Refills  . buPROPion (WELLBUTRIN SR) 150 MG 12 hr tablet [Pharmacy Med Name: BUPROPION SR 150MG  TABLETS (12 H)] 180 tablet 0    Sig: TAKE 2 TABLETS(300 MG) BY MOUTH DAILY     Psychiatry: Antidepressants - bupropion Passed - 04/27/2018  3:47 AM      Passed - Completed PHQ-2 or PHQ-9 in the last 360 days.      Passed - Last BP in normal range    BP Readings from Last 1 Encounters:  04/25/18 130/81         Passed - Valid encounter within last 6 months    Recent Outpatient Visits          5 months ago Diarrhea of presumed infectious origin   [facility] at [facility], Eilleen Kempf, MD   5 months ago Fibromyalgia   [facility] at [facility], Levell July, MD   5 months ago Nausea without vomiting   [facility] at [facility], Asencion Partridge, MD   6 months ago Chronic intractable headache, unspecified headache type   [facility] at [facility], Levell July, MD   8 months ago Scalp pain   Primary Care at [facility], [place], [place]

## 2018-05-15 ENCOUNTER — Other Ambulatory Visit (INDEPENDENT_AMBULATORY_CARE_PROVIDER_SITE_OTHER): Payer: Self-pay | Admitting: Family Medicine

## 2018-05-15 ENCOUNTER — Ambulatory Visit (INDEPENDENT_AMBULATORY_CARE_PROVIDER_SITE_OTHER): Payer: 59 | Admitting: Family Medicine

## 2018-05-15 DIAGNOSIS — E559 Vitamin D deficiency, unspecified: Secondary | ICD-10-CM

## 2018-05-16 ENCOUNTER — Encounter (INDEPENDENT_AMBULATORY_CARE_PROVIDER_SITE_OTHER): Payer: Self-pay | Admitting: Family Medicine

## 2018-05-16 ENCOUNTER — Ambulatory Visit (INDEPENDENT_AMBULATORY_CARE_PROVIDER_SITE_OTHER): Payer: 59 | Admitting: Family Medicine

## 2018-05-16 VITALS — BP 136/80 | HR 87 | Temp 97.9°F | Ht 64.0 in | Wt 283.0 lb

## 2018-05-16 DIAGNOSIS — E66813 Obesity, class 3: Secondary | ICD-10-CM

## 2018-05-16 DIAGNOSIS — E559 Vitamin D deficiency, unspecified: Secondary | ICD-10-CM

## 2018-05-16 DIAGNOSIS — Z6841 Body Mass Index (BMI) 40.0 and over, adult: Secondary | ICD-10-CM | POA: Diagnosis not present

## 2018-05-16 DIAGNOSIS — E1165 Type 2 diabetes mellitus with hyperglycemia: Secondary | ICD-10-CM | POA: Diagnosis not present

## 2018-05-16 DIAGNOSIS — Z9189 Other specified personal risk factors, not elsewhere classified: Secondary | ICD-10-CM

## 2018-05-16 MED ORDER — VITAMIN D (ERGOCALCIFEROL) 1.25 MG (50000 UNIT) PO CAPS: 50000.0000 [IU] | ORAL_CAPSULE | ORAL | 0 refills | Status: DC

## 2018-05-16 MED ORDER — SEMAGLUTIDE 7 MG PO TABS: 7.0000 mg | ORAL_TABLET | Freq: Every day | ORAL | 0 refills | Status: DC
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
## 2018-05-22 ENCOUNTER — Encounter (INDEPENDENT_AMBULATORY_CARE_PROVIDER_SITE_OTHER): Payer: Self-pay | Admitting: Family Medicine

## 2018-05-30 ENCOUNTER — Ambulatory Visit (INDEPENDENT_AMBULATORY_CARE_PROVIDER_SITE_OTHER): Payer: 59 | Admitting: Family Medicine

## 2018-06-07 DIAGNOSIS — G4733 Obstructive sleep apnea (adult) (pediatric): Secondary | ICD-10-CM | POA: Insufficient documentation

## 2018-06-18 ENCOUNTER — Other Ambulatory Visit (INDEPENDENT_AMBULATORY_CARE_PROVIDER_SITE_OTHER): Payer: Self-pay | Admitting: Family Medicine

## 2018-06-18 DIAGNOSIS — E559 Vitamin D deficiency, unspecified: Secondary | ICD-10-CM

## 2018-06-19 ENCOUNTER — Other Ambulatory Visit: Payer: Self-pay | Admitting: Family Medicine

## 2019-10-01 DIAGNOSIS — K6289 Other specified diseases of anus and rectum: Secondary | ICD-10-CM | POA: Insufficient documentation

## 2019-10-01 DIAGNOSIS — K602 Anal fissure, unspecified: Secondary | ICD-10-CM | POA: Insufficient documentation

## 2021-06-08 DIAGNOSIS — M4807 Spinal stenosis, lumbosacral region: Secondary | ICD-10-CM | POA: Insufficient documentation

## 2021-09-18 ENCOUNTER — Ambulatory Visit (HOSPITAL_COMMUNITY)
Admission: EM | Admit: 2021-09-18 | Discharge: 2021-09-19 | Disposition: A | Discharge status: Other Acute Inpatient Hospital | Payer: No Payment, Other | Attending: Psychiatry | Admitting: Psychiatry

## 2021-09-18 DIAGNOSIS — E119 Type 2 diabetes mellitus without complications: Secondary | ICD-10-CM

## 2021-09-18 DIAGNOSIS — F3110 Bipolar disorder, current episode manic without psychotic features, unspecified: Secondary | ICD-10-CM

## 2021-09-18 DIAGNOSIS — K589 Irritable bowel syndrome without diarrhea: Secondary | ICD-10-CM | POA: Insufficient documentation

## 2021-09-18 DIAGNOSIS — F319 Bipolar disorder, unspecified: Secondary | ICD-10-CM | POA: Insufficient documentation

## 2021-09-18 DIAGNOSIS — M797 Fibromyalgia: Secondary | ICD-10-CM | POA: Insufficient documentation

## 2021-09-18 DIAGNOSIS — Z20822 Contact with and (suspected) exposure to covid-19: Secondary | ICD-10-CM | POA: Insufficient documentation

## 2021-09-18 LAB — LIPID PANEL
Cholesterol: 140 mg/dL (ref 0–200)
HDL: 50 mg/dL (ref >40)
LDL Cholesterol: 55 mg/dL (ref 0–99)
Total CHOL/HDL Ratio: 2.8 RATIO
Triglycerides: 176 mg/dL — ABNORMAL HIGH (ref <150)
VLDL: 35 mg/dL (ref 0–40)

## 2021-09-18 LAB — RESP PANEL BY RT-PCR (FLU A&B, COVID) ARPGX2
Influenza A by PCR: NEGATIVE
Influenza B by PCR: NEGATIVE
SARS Coronavirus 2 by RT PCR: NEGATIVE

## 2021-09-18 LAB — CBC WITH DIFFERENTIAL/PLATELET
Abs Immature Granulocytes: 0.04 10*3/uL (ref 0.00–0.07)
Basophils Absolute: 0 10*3/uL (ref 0.0–0.1)
Basophils Relative: 0 %
Eosinophils Absolute: 0.2 10*3/uL (ref 0.0–0.5)
Eosinophils Relative: 2 %
HCT: 40.1 % (ref 36.0–46.0)
Hemoglobin: 13.4 g/dL (ref 12.0–15.0)
Immature Granulocytes: 1 %
Lymphocytes Relative: 20 %
Lymphs Abs: 1.5 10*3/uL (ref 0.7–4.0)
MCH: 29.6 pg (ref 26.0–34.0)
MCHC: 33.4 g/dL (ref 30.0–36.0)
MCV: 88.7 fL (ref 80.0–100.0)
Monocytes Absolute: 0.4 10*3/uL (ref 0.1–1.0)
Monocytes Relative: 6 %
Neutro Abs: 5.6 10*3/uL (ref 1.7–7.7)
Neutrophils Relative %: 71 %
Platelets: 301 10*3/uL (ref 150–400)
RBC: 4.52 MIL/uL (ref 3.87–5.11)
RDW: 13 % (ref 11.5–15.5)
WBC: 7.8 10*3/uL (ref 4.0–10.5)
nRBC: 0 % (ref 0.0–0.2)

## 2021-09-18 LAB — POCT PREGNANCY, URINE: Preg Test, Ur: NEGATIVE

## 2021-09-18 LAB — HEMOGLOBIN A1C
Hgb A1c MFr Bld: 10.7 % — ABNORMAL HIGH (ref 4.8–5.6)
Mean Plasma Glucose: 260.39 mg/dL

## 2021-09-18 LAB — COMPREHENSIVE METABOLIC PANEL
ALT: 32 U/L (ref 0–44)
AST: 24 U/L (ref 15–41)
Albumin: 3.7 g/dL (ref 3.5–5.0)
Alkaline Phosphatase: 63 U/L (ref 38–126)
Anion gap: 11 (ref 5–15)
BUN: 7 mg/dL (ref 6–20)
CO2: 24 mmol/L (ref 22–32)
Calcium: 9.2 mg/dL (ref 8.9–10.3)
Chloride: 100 mmol/L (ref 98–111)
Creatinine, Ser: 0.59 mg/dL (ref 0.44–1.00)
GFR, Estimated: >60 mL/min (ref >60)
Glucose, Bld: 280 mg/dL — ABNORMAL HIGH (ref 70–99)
Potassium: 4 mmol/L (ref 3.5–5.1)
Sodium: 135 mmol/L (ref 135–145)
Total Bilirubin: 0.5 mg/dL (ref 0.3–1.2)
Total Protein: 6.7 g/dL (ref 6.5–8.1)

## 2021-09-18 LAB — SARS CORONAVIRUS 2 BY RT PCR: SARS Coronavirus 2 by RT PCR: NEGATIVE

## 2021-09-18 LAB — POC SARS CORONAVIRUS 2 AG: SARSCOV2ONAVIRUS 2 AG: NEGATIVE

## 2021-09-18 LAB — TSH: TSH: 1.962 u[IU]/mL (ref 0.350–4.500)

## 2021-09-18 MED ORDER — GLIPIZIDE 5 MG PO TABS
5.0000 mg | ORAL_TABLET | Freq: Two times a day (BID) | ORAL | Status: DC
  Administered 2021-09-18 – 2021-09-19 (×3): 5 mg via ORAL
  Filled 2021-09-18 (×3): qty 1

## 2021-09-18 MED ORDER — METFORMIN HCL 500 MG PO TABS
1000.0000 mg | ORAL_TABLET | Freq: Two times a day (BID) | ORAL | Status: DC
  Administered 2021-09-18 – 2021-09-19 (×2): 1000 mg via ORAL
  Filled 2021-09-18 (×2): qty 2

## 2021-09-18 MED ORDER — LORATADINE 10 MG PO TABS
10.0000 mg | ORAL_TABLET | Freq: Every day | ORAL | Status: DC
  Administered 2021-09-18: 10 mg via ORAL
  Filled 2021-09-18: qty 1

## 2021-09-18 MED ORDER — VITAMIN D 25 MCG (1000 UNIT) PO TABS
1000.0000 [IU] | ORAL_TABLET | Freq: Every day | ORAL | Status: DC
  Administered 2021-09-18 – 2021-09-19 (×2): 1000 [IU] via ORAL
  Filled 2021-09-18 (×2): qty 1

## 2021-09-18 MED ORDER — TRAZODONE HCL 50 MG PO TABS
50.0000 mg | ORAL_TABLET | Freq: Every evening | ORAL | Status: DC | PRN
  Administered 2021-09-18: 50 mg via ORAL
  Filled 2021-09-18: qty 1

## 2021-09-18 MED ORDER — MAGNESIUM HYDROXIDE 400 MG/5ML PO SUSP: 30.0000 mL | Freq: Every day | ORAL | Status: DC | PRN

## 2021-09-18 MED ORDER — ACETAMINOPHEN 325 MG PO TABS: 650.0000 mg | ORAL_TABLET | Freq: Four times a day (QID) | ORAL | Status: DC | PRN

## 2021-09-18 MED ORDER — ALUM & MAG HYDROXIDE-SIMETH 200-200-20 MG/5ML PO SUSP: 30.0000 mL | ORAL | Status: DC | PRN

## 2021-09-18 MED ORDER — ALBUTEROL SULFATE HFA 108 (90 BASE) MCG/ACT IN AERS: 1.0000 | INHALATION_SPRAY | RESPIRATORY_TRACT | Status: DC | PRN

## 2021-09-18 MED ORDER — VITAMIN B-12 100 MCG PO TABS
100.0000 ug | ORAL_TABLET | Freq: Every day | ORAL | Status: DC
  Administered 2021-09-18 – 2021-09-19 (×2): 100 ug via ORAL
  Filled 2021-09-18 (×2): qty 1

## 2021-09-18 MED ORDER — HYDROXYZINE HCL 25 MG PO TABS
25.0000 mg | ORAL_TABLET | Freq: Three times a day (TID) | ORAL | Status: DC | PRN
  Administered 2021-09-18: 25 mg via ORAL
  Filled 2021-09-18: qty 1

## 2021-09-18 MED ORDER — OLANZAPINE 5 MG PO TABS
5.0000 mg | ORAL_TABLET | Freq: Two times a day (BID) | ORAL | Status: DC
  Administered 2021-09-18 – 2021-09-19 (×3): 5 mg via ORAL
  Filled 2021-09-18 (×3): qty 1

## 2021-09-18 MED ORDER — ATORVASTATIN CALCIUM 10 MG PO TABS
20.0000 mg | ORAL_TABLET | Freq: Every day | ORAL | Status: DC
  Administered 2021-09-18 – 2021-09-19 (×2): 20 mg via ORAL
  Filled 2021-09-18 (×2): qty 2

## 2021-09-18 MED ORDER — LEVOCETIRIZINE DIHYDROCHLORIDE 5 MG PO TABS: 5.0000 mg | ORAL_TABLET | Freq: Every day | ORAL | Status: DC

## 2021-09-18 NOTE — ED Notes (Signed)
Pt laying in bed talking to self and laughing inappropriately. Respirations even and unlabored. No signs of acute distress noted. Will continue to monitor for safety.

## 2021-09-18 NOTE — ED Notes (Signed)
Pt is removing shirt and exposing herself. Staff instructed pt to keep her shirt on. Pt verbalized understanding and laid back down. Will continue to monitor for safety.

## 2021-09-18 NOTE — ED Notes (Signed)
UDS And urine pregnancy completed, UDS negative for all drugs, Urine pregnancy negative. Provider aware

## 2021-09-18 NOTE — ED Notes (Signed)
Pt stated "everyone can go home now." Pt then got up from bed and attempted to open door to get off unit. RN explained to pt that she cannot open the door and instructed pt to go lay back down. Pt easily redirectable and cooperative. No signs of acute distress noted. Will continue to monitor for safety.
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
## 2021-09-18 NOTE — ED Notes (Signed)
Patient arrived on unit and oriented to unit, given lunch, continues to have disorganized thoughts and pre-occupied religious thoughts. Pt is safe and redirectable.

## 2021-09-18 NOTE — ED Notes (Signed)
Pt arrived to the unit.

## 2021-09-18 NOTE — Progress Notes (Signed)
CSW requested that [facility] Rosey Bath, RN review pt for potential admission at [facility]. CSW will assist and follow with placement.   Maryjean Ka, MSW, [facility] 09/18/2021 6:25 PM

## 2021-09-18 NOTE — ED Notes (Signed)
Pt resting in bed watching TV. Alert with pressured, tangential speech. Pt denies current SI/HI/AVH. Pt denies any current needs. No signs of acute distress noted. Will continue to monitor for safety.
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
## 2021-09-19 ENCOUNTER — Other Ambulatory Visit: Payer: Self-pay

## 2021-09-19 ENCOUNTER — Encounter (HOSPITAL_COMMUNITY): Payer: Self-pay | Admitting: Student in an Organized Health Care Education/Training Program

## 2021-09-19 ENCOUNTER — Inpatient Hospital Stay (HOSPITAL_COMMUNITY)
Admission: RE | Admit: 2021-09-19 | Discharge: 2021-10-05 | DRG: 885 | Disposition: A | Discharge status: Home / Self Care | Payer: No Typology Code available for payment source | Attending: Psychiatry | Admitting: Psychiatry

## 2021-09-19 DIAGNOSIS — R319 Hematuria, unspecified: Secondary | ICD-10-CM | POA: Diagnosis present

## 2021-09-19 DIAGNOSIS — G4700 Insomnia, unspecified: Secondary | ICD-10-CM | POA: Diagnosis present

## 2021-09-19 DIAGNOSIS — Z794 Long term (current) use of insulin: Secondary | ICD-10-CM

## 2021-09-19 DIAGNOSIS — J45909 Unspecified asthma, uncomplicated: Secondary | ICD-10-CM | POA: Diagnosis present

## 2021-09-19 DIAGNOSIS — L293 Anogenital pruritus, unspecified: Secondary | ICD-10-CM | POA: Diagnosis present

## 2021-09-19 DIAGNOSIS — F4310 Post-traumatic stress disorder, unspecified: Secondary | ICD-10-CM | POA: Diagnosis present

## 2021-09-19 DIAGNOSIS — M62838 Other muscle spasm: Secondary | ICD-10-CM | POA: Diagnosis present

## 2021-09-19 DIAGNOSIS — F4010 Social phobia, unspecified: Secondary | ICD-10-CM | POA: Diagnosis present

## 2021-09-19 DIAGNOSIS — M797 Fibromyalgia: Secondary | ICD-10-CM | POA: Diagnosis present

## 2021-09-19 DIAGNOSIS — Z833 Family history of diabetes mellitus: Secondary | ICD-10-CM

## 2021-09-19 DIAGNOSIS — Z818 Family history of other mental and behavioral disorders: Secondary | ICD-10-CM

## 2021-09-19 DIAGNOSIS — Z91040 Latex allergy status: Secondary | ICD-10-CM | POA: Diagnosis not present

## 2021-09-19 DIAGNOSIS — Z79899 Other long term (current) drug therapy: Secondary | ICD-10-CM

## 2021-09-19 DIAGNOSIS — Z7984 Long term (current) use of oral hypoglycemic drugs: Secondary | ICD-10-CM

## 2021-09-19 DIAGNOSIS — F3110 Bipolar disorder, current episode manic without psychotic features, unspecified: Principal | ICD-10-CM | POA: Diagnosis present

## 2021-09-19 DIAGNOSIS — E119 Type 2 diabetes mellitus without complications: Secondary | ICD-10-CM | POA: Diagnosis present

## 2021-09-19 DIAGNOSIS — Z9152 Personal history of nonsuicidal self-harm: Secondary | ICD-10-CM | POA: Diagnosis not present

## 2021-09-19 DIAGNOSIS — M549 Dorsalgia, unspecified: Secondary | ICD-10-CM | POA: Diagnosis present

## 2021-09-19 DIAGNOSIS — I10 Essential (primary) hypertension: Secondary | ICD-10-CM | POA: Diagnosis present

## 2021-09-19 DIAGNOSIS — R000 Tachycardia, unspecified: Secondary | ICD-10-CM | POA: Diagnosis present

## 2021-09-19 LAB — GLUCOSE, CAPILLARY
Glucose-Capillary: 291 mg/dL — ABNORMAL HIGH (ref 70–99)
Glucose-Capillary: 252 mg/dL — ABNORMAL HIGH (ref 70–99)
Glucose-Capillary: 215 mg/dL — ABNORMAL HIGH (ref 70–99)

## 2021-09-19 MED ORDER — ACETAMINOPHEN 325 MG PO TABS
650.0000 mg | ORAL_TABLET | Freq: Four times a day (QID) | ORAL | Status: DC | PRN
  Administered 2021-09-20 – 2021-10-05 (×16): 650 mg via ORAL
  Filled 2021-09-19 (×17): qty 2

## 2021-09-19 MED ORDER — BENZTROPINE MESYLATE 1 MG PO TABS
1.0000 mg | ORAL_TABLET | Freq: Four times a day (QID) | ORAL | Status: DC | PRN
  Administered 2021-09-19 – 2021-09-23 (×3): 1 mg via ORAL
  Filled 2021-09-19 (×3): qty 1

## 2021-09-19 MED ORDER — LORAZEPAM 1 MG PO TABS
1.0000 mg | ORAL_TABLET | Freq: Two times a day (BID) | ORAL | Status: DC
  Administered 2021-09-19 – 2021-09-21 (×4): 1 mg via ORAL
  Filled 2021-09-19 (×5): qty 1

## 2021-09-19 MED ORDER — HALOPERIDOL 2 MG PO TABS
2.0000 mg | ORAL_TABLET | Freq: Once | ORAL | Status: AC
Start: 2021-09-19 — End: 2021-09-19
  Administered 2021-09-19: 2 mg via ORAL
  Filled 2021-09-19 (×2): qty 1

## 2021-09-19 MED ORDER — DIPHENHYDRAMINE HCL 25 MG PO CAPS
25.0000 mg | ORAL_CAPSULE | Freq: Once | ORAL | Status: AC
  Administered 2021-09-19: 25 mg via ORAL
  Filled 2021-09-19: qty 1

## 2021-09-19 MED ORDER — INSULIN ASPART 100 UNIT/ML IJ SOLN
0.0000 [IU] | Freq: Every day | INTRAMUSCULAR | Status: DC
  Administered 2021-09-19: 2 [IU] via SUBCUTANEOUS
  Administered 2021-09-20 – 2021-09-22 (×3): 3 [IU] via SUBCUTANEOUS
  Administered 2021-09-24: 4 [IU] via SUBCUTANEOUS
  Administered 2021-09-25 – 2021-09-26 (×2): 3 [IU] via SUBCUTANEOUS
  Administered 2021-09-27 – 2021-10-01 (×5): 4 [IU] via SUBCUTANEOUS
  Administered 2021-10-02 – 2021-10-04 (×3): 3 [IU] via SUBCUTANEOUS

## 2021-09-19 MED ORDER — HALOPERIDOL 5 MG PO TABS: 5.0000 mg | ORAL_TABLET | Freq: Four times a day (QID) | ORAL | Status: DC | PRN

## 2021-09-19 MED ORDER — BENZTROPINE MESYLATE 1 MG PO TABS
1.0000 mg | ORAL_TABLET | Freq: Four times a day (QID) | ORAL | Status: DC | PRN
Start: 2021-09-19 — End: 2021-09-19

## 2021-09-19 MED ORDER — OLANZAPINE 10 MG PO TABS
10.0000 mg | ORAL_TABLET | Freq: Once | ORAL | Status: AC | PRN
  Administered 2021-09-19: 10 mg via ORAL
  Filled 2021-09-19: qty 1

## 2021-09-19 MED ORDER — CLONIDINE HCL 0.1 MG PO TABS
0.1000 mg | ORAL_TABLET | Freq: Four times a day (QID) | ORAL | Status: DC | PRN
  Administered 2021-09-21: 0.1 mg via ORAL
  Filled 2021-09-19: qty 1

## 2021-09-19 MED ORDER — HALOPERIDOL 5 MG PO TABS
5.0000 mg | ORAL_TABLET | Freq: Four times a day (QID) | ORAL | Status: DC | PRN
  Administered 2021-09-19 – 2021-09-23 (×3): 5 mg via ORAL
  Filled 2021-09-19 (×3): qty 1

## 2021-09-19 MED ORDER — TRAZODONE HCL 50 MG PO TABS
50.0000 mg | ORAL_TABLET | Freq: Every evening | ORAL | Status: DC | PRN
  Administered 2021-09-19 – 2021-10-02 (×11): 50 mg via ORAL
  Filled 2021-09-19 (×11): qty 1

## 2021-09-19 MED ORDER — INSULIN ASPART 100 UNIT/ML IJ SOLN
0.0000 [IU] | Freq: Three times a day (TID) | INTRAMUSCULAR | Status: DC
  Administered 2021-09-19 – 2021-09-20 (×2): 8 [IU] via SUBCUTANEOUS
  Administered 2021-09-20: 3 [IU] via SUBCUTANEOUS
  Administered 2021-09-20: 11 [IU] via SUBCUTANEOUS
  Administered 2021-09-21: 2 [IU] via SUBCUTANEOUS
  Administered 2021-09-21: 5 [IU] via SUBCUTANEOUS
  Administered 2021-09-21: 8 [IU] via SUBCUTANEOUS
  Administered 2021-09-22: 3 [IU] via SUBCUTANEOUS
  Administered 2021-09-22 (×2): 5 [IU] via SUBCUTANEOUS
  Administered 2021-09-23 (×2): 8 [IU] via SUBCUTANEOUS
  Administered 2021-09-23: 3 [IU] via SUBCUTANEOUS
  Administered 2021-09-24: 8 [IU] via SUBCUTANEOUS
  Administered 2021-09-24: 11 [IU] via SUBCUTANEOUS
  Administered 2021-09-24: 3 [IU] via SUBCUTANEOUS
  Administered 2021-09-25: 15 [IU] via SUBCUTANEOUS
  Administered 2021-09-25 (×2): 8 [IU] via SUBCUTANEOUS
  Administered 2021-09-26: 3 [IU] via SUBCUTANEOUS
  Administered 2021-09-26: 8 [IU] via SUBCUTANEOUS
  Administered 2021-09-26: 11 [IU] via SUBCUTANEOUS
  Administered 2021-09-27: 8 [IU] via SUBCUTANEOUS
  Administered 2021-09-27: 5 [IU] via SUBCUTANEOUS
  Administered 2021-09-27: 3 [IU] via SUBCUTANEOUS
  Administered 2021-09-28 (×2): 8 [IU] via SUBCUTANEOUS
  Administered 2021-09-28: 3 [IU] via SUBCUTANEOUS
  Administered 2021-09-29: 8 [IU] via SUBCUTANEOUS
  Administered 2021-09-29: 5 [IU] via SUBCUTANEOUS
  Administered 2021-09-29: 8 [IU] via SUBCUTANEOUS
  Administered 2021-09-30: 11 [IU] via SUBCUTANEOUS
  Administered 2021-09-30: 3 [IU] via SUBCUTANEOUS
  Administered 2021-09-30 – 2021-10-01 (×2): 11 [IU] via SUBCUTANEOUS
  Administered 2021-10-01: 8 [IU] via SUBCUTANEOUS
  Administered 2021-10-01: 11 [IU] via SUBCUTANEOUS
  Administered 2021-10-02: 8 [IU] via SUBCUTANEOUS
  Administered 2021-10-02: 5 [IU] via SUBCUTANEOUS
  Administered 2021-10-02: 8 [IU] via SUBCUTANEOUS
  Administered 2021-10-03 (×3): 5 [IU] via SUBCUTANEOUS
  Administered 2021-10-04 (×2): 3 [IU] via SUBCUTANEOUS
  Administered 2021-10-04: 11 [IU] via SUBCUTANEOUS
  Administered 2021-10-05: 3 [IU] via SUBCUTANEOUS

## 2021-09-19 MED ORDER — MAGNESIUM HYDROXIDE 400 MG/5ML PO SUSP
30.0000 mL | Freq: Every day | ORAL | Status: DC | PRN
Start: 2021-09-19 — End: 2021-10-05

## 2021-09-19 MED ORDER — CLONIDINE HCL 0.1 MG PO TABS
0.1000 mg | ORAL_TABLET | Freq: Once | ORAL | Status: AC
  Administered 2021-09-19: 0.1 mg via ORAL
  Filled 2021-09-19: qty 1

## 2021-09-19 MED ORDER — LORAZEPAM 1 MG PO TABS
2.0000 mg | ORAL_TABLET | Freq: Once | ORAL | Status: AC | PRN
  Administered 2021-09-19: 2 mg via ORAL
  Filled 2021-09-19: qty 2

## 2021-09-19 MED ORDER — ALUM & MAG HYDROXIDE-SIMETH 200-200-20 MG/5ML PO SUSP
30.0000 mL | ORAL | Status: DC | PRN
  Administered 2021-09-23 – 2021-09-27 (×7): 30 mL via ORAL
  Filled 2021-09-19 (×7): qty 30

## 2021-09-19 NOTE — Group Note (Signed)
Patient just arrived, was not present at the time group would have taken place.  Ambrose Mantle, LCSW 09/19/2021, 4:35 PM

## 2021-09-19 NOTE — ED Notes (Addendum)
Pt was very tearful. Pt was given kleenex. Pt is now calm and watching tv at this time. Will continue to monitor for safety.

## 2021-09-19 NOTE — ED Notes (Signed)
Pt was given a sub and juice for lunch.  

## 2021-09-19 NOTE — ED Notes (Signed)
Pt continues with pressured, loud, tangential speech. Staff have repetitively asked pt to keep her voice down, but she has not been compliant. Pt has not slept and PRN medications administered earlier have not been effective in reducing agitation. Cecilio Asper, NP notified.

## 2021-09-19 NOTE — Progress Notes (Signed)
   09/19/21 2102  Psych Admission Type (Psych Patients Only)  Admission Status Voluntary  Psychosocial Assessment  Patient Complaints Anxiety  Eye Contact Fair  Facial Expression Anxious  Affect Preoccupied  Speech Tangential  Interaction Forwards little  Motor Activity Fidgety;Restless  Appearance/Hygiene Disheveled  Behavior Characteristics Appropriate to situation  Mood Preoccupied  Thought Process  Coherency Tangential  Content Preoccupation  Delusions Religious  Perception Hallucinations  Hallucination Auditory;Visual  Judgment Poor  Confusion Moderate  Danger to Self  Current suicidal ideation? Denies  Agreement Not to Harm Self Yes  Description of Agreement verbal  Danger to Others  Danger to Others None reported or observed
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
## 2021-09-19 NOTE — ED Notes (Signed)
Leandro Reasoner, NP notified of pt's vital signs.

## 2021-09-19 NOTE — ED Notes (Signed)
Pt was given a muffin and juice.  
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
## 2021-09-19 NOTE — Discharge Instructions (Signed)
Take all medications as prescribed. Keep all follow-up appointments as scheduled.  Do not consume alcohol or use illegal drugs while on prescription medications. Report any adverse effects from your medications to your primary care provider promptly.  In the event of recurrent symptoms or worsening symptoms, call 911, a crisis hotline, or go to the nearest emergency department for evaluation.   

## 2021-09-19 NOTE — ED Notes (Addendum)
Pt has been talking loudly all night, yelling out. . She has been redirectable and will stop for a few minutes but will continue. She has not slept all night and medications(see MAR) have been administered without any relief for her

## 2021-09-19 NOTE — Progress Notes (Signed)
Adult Psychoeducational Group Note  Date:  09/19/2021 Time:  8:58 PM  Group Topic/Focus:  Wrap-Up Group:   The focus of this group is to help patients review their daily goal of treatment and discuss progress on daily workbooks.  Participation Level:  None  Participation Quality:   Did Not participate  Affect:   Did Not Participate  Cognitive:  Did Not Participate  Insight: None  Engagement in Group:  Did Not Participate  Modes of Intervention:  Did Not Participate  Additional Comments: Pt attended wrap up group by choose not to participate.   Felipa Furnace 09/19/2021, 8:58 PM

## 2021-09-19 NOTE — ED Notes (Signed)
Pt is sitting in the bed talking to herself, no distress noted, will continue to monitor patient for safety

## 2021-09-19 NOTE — ED Notes (Signed)
Pts blood sugar is 277 when taken this morning.

## 2021-09-19 NOTE — ED Notes (Signed)
Pt has just been given clonidine for Bp, safe transport has been called and is en route to pick up patient and transport over to [facility]
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
## 2021-09-19 NOTE — ED Notes (Signed)
Pts morning medications have been given, pt is sitting in bed quietly, no distress has been noted, will continue to monitor patient for safety

## 2021-09-19 NOTE — ED Notes (Signed)
Pt continues with pressured, tangential speech. Has gotten up several times to attempt to open door to leave unit. Pt states she has the "secret code, wrote a blank check, and made a deal with the devil." Pt will lay down briefly and then get right back up. Pt became very agitated and loud when another pt came up to nurse's station. Pt attempted to tell the other pt to go lay back down and when staff told pt not to do this, pt stated she was interrupting staff's conversation with the other pt on purpose. Pt remained agitated with loud, pressured speech. Minimally cooperative with staff's request to lower voice and lay back down. Cecilio Asper, NP notified. Awaiting PRN medication orders.

## 2021-09-20 DIAGNOSIS — F3110 Bipolar disorder, current episode manic without psychotic features, unspecified: Principal | ICD-10-CM

## 2021-09-20 LAB — GLUCOSE, CAPILLARY
Glucose-Capillary: 185 mg/dL — ABNORMAL HIGH (ref 70–99)
Glucose-Capillary: 302 mg/dL — ABNORMAL HIGH (ref 70–99)
Glucose-Capillary: 264 mg/dL — ABNORMAL HIGH (ref 70–99)
Glucose-Capillary: 274 mg/dL — ABNORMAL HIGH (ref 70–99)

## 2021-09-20 MED ORDER — ARIPIPRAZOLE 10 MG PO TABS
10.0000 mg | ORAL_TABLET | Freq: Every day | ORAL | Status: DC
  Administered 2021-09-20 – 2021-09-21 (×2): 10 mg via ORAL
  Filled 2021-09-20 (×4): qty 1

## 2021-09-20 MED ORDER — DIVALPROEX SODIUM 500 MG PO DR TAB
500.0000 mg | DELAYED_RELEASE_TABLET | Freq: Two times a day (BID) | ORAL | Status: DC
  Administered 2021-09-20 – 2021-09-21 (×3): 500 mg via ORAL
  Filled 2021-09-20 (×6): qty 1

## 2021-09-20 NOTE — BHH Group Notes (Signed)
Adult Psychoeducational Group Not Date:  09/20/2021 Time:  0900-1045 Group Topic/Focus: PROGRESSIVE RELAXATION. A group where deep breathing is taught and tensing and relaxation muscle groups is used. Imagery is used as well.  Pts are asked to imagine 3 pillars that hold them up when they are not able to hold themselves up and to share that with the group.  Participation Level:  did not attend  Dhriti Fales A   
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
## 2021-09-20 NOTE — Progress Notes (Signed)
   09/20/21 1100  Psych Admission Type (Psych Patients Only)  Admission Status Voluntary  Psychosocial Assessment  Patient Complaints Anxiety  Eye Contact Fair  Facial Expression Anxious  Affect Preoccupied  Speech Tangential  Interaction Forwards little  Motor Activity Fidgety;Restless  Appearance/Hygiene Disheveled  Behavior Characteristics Restless;Anxious  Mood Preoccupied  Thought Process  Coherency Tangential  Content Preoccupation  Delusions Religious  Perception Hallucinations  Hallucination Auditory;Visual  Judgment Poor  Confusion Moderate  Danger to Self  Current suicidal ideation? Denies  Agreement Not to Harm Self Yes  Description of Agreement Verbal  Danger to Others  Danger to Others None reported or observed
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
## 2021-09-20 NOTE — Progress Notes (Signed)
[facility] Group Notes:  (Nursing/MHT/Case Management/Adjunct)  Date:  09/20/2021  Time:  2000  Type of Therapy:   wrap up group  Participation Level:  Active  Participation Quality:  Intrusive, Monopolizing, Redirectable, and Sharing  Affect:  Labile and Tearful  Cognitive:  Disorganized and Delusional  Insight:  Limited  Engagement in Group:  Engaged  Modes of Intervention:  Clarification, Education, and Support  Summary of Progress/Problems: Positive thinking and positive change were discussed. Pt shared that she wants to communicate better with others once discharged. Pt reports being grateful that her thoughts she is having were not actions but just dreams she believed to be true. Pt reports I will not speak in code anymore. I will be able to sleep, it was the code that wasn't allowing me to sleep. Pt is tearful and sticks arms out in front of her " cut my arms off, just cut off my arms".   Marcille Buffy 09/20/2021, 8:52 PM

## 2021-09-20 NOTE — Group Note (Signed)
LCSW Group Therapy Note   Group was not held due to acuity on the unit and low staffing for CSW team.  Fenix Rorke J Grossman-Orr, LCSWA 09/20/2021  12:40 PM   

## 2021-09-21 ENCOUNTER — Encounter (HOSPITAL_COMMUNITY): Payer: Self-pay

## 2021-09-21 LAB — GLUCOSE, CAPILLARY
Glucose-Capillary: 277 mg/dL — ABNORMAL HIGH (ref 70–99)
Glucose-Capillary: 212 mg/dL — ABNORMAL HIGH (ref 70–99)
Glucose-Capillary: 295 mg/dL — ABNORMAL HIGH (ref 70–99)
Glucose-Capillary: 187 mg/dL — ABNORMAL HIGH (ref 70–99)
Glucose-Capillary: 289 mg/dL — ABNORMAL HIGH (ref 70–99)
Glucose-Capillary: 288 mg/dL — ABNORMAL HIGH (ref 70–99)

## 2021-09-21 MED ORDER — LISINOPRIL 5 MG PO TABS
5.0000 mg | ORAL_TABLET | Freq: Every day | ORAL | Status: DC
  Administered 2021-09-21 – 2021-10-01 (×11): 5 mg via ORAL
  Filled 2021-09-21 (×12): qty 1

## 2021-09-21 MED ORDER — ARIPIPRAZOLE 5 MG PO TABS
5.0000 mg | ORAL_TABLET | Freq: Every day | ORAL | Status: AC
  Administered 2021-09-21: 5 mg via ORAL
  Filled 2021-09-21: qty 1

## 2021-09-21 MED ORDER — METFORMIN HCL 500 MG PO TABS
500.0000 mg | ORAL_TABLET | Freq: Two times a day (BID) | ORAL | Status: DC
  Administered 2021-09-21 – 2021-10-05 (×28): 500 mg via ORAL
  Filled 2021-09-21 (×32): qty 1

## 2021-09-21 MED ORDER — DIVALPROEX SODIUM 250 MG PO DR TAB
750.0000 mg | DELAYED_RELEASE_TABLET | Freq: Two times a day (BID) | ORAL | Status: DC
  Administered 2021-09-21 – 2021-10-05 (×28): 750 mg via ORAL
  Filled 2021-09-21 (×30): qty 3

## 2021-09-21 MED ORDER — TEMAZEPAM 15 MG PO CAPS
15.0000 mg | ORAL_CAPSULE | Freq: Every evening | ORAL | Status: DC | PRN
  Administered 2021-09-21 – 2021-09-28 (×12): 15 mg via ORAL
  Filled 2021-09-21 (×12): qty 1

## 2021-09-21 MED ORDER — LORAZEPAM 1 MG PO TABS: 1.0000 mg | ORAL_TABLET | Freq: Three times a day (TID) | ORAL | Status: DC

## 2021-09-21 MED ORDER — LORAZEPAM 1 MG PO TABS
1.0000 mg | ORAL_TABLET | Freq: Two times a day (BID) | ORAL | Status: DC
  Administered 2021-09-21 – 2021-09-29 (×16): 1 mg via ORAL
  Filled 2021-09-21 (×16): qty 1

## 2021-09-21 MED ORDER — PROPRANOLOL HCL 10 MG PO TABS
10.0000 mg | ORAL_TABLET | Freq: Two times a day (BID) | ORAL | Status: DC
  Administered 2021-09-21 – 2021-10-01 (×20): 10 mg via ORAL
  Filled 2021-09-21 (×24): qty 1

## 2021-09-21 MED ORDER — ARIPIPRAZOLE 15 MG PO TABS
15.0000 mg | ORAL_TABLET | Freq: Every day | ORAL | Status: DC
  Administered 2021-09-22: 15 mg via ORAL
  Filled 2021-09-21 (×2): qty 1

## 2021-09-21 NOTE — Group Note (Signed)
Type of Therapy and Topic: Group Therapy: Control  Participation Level: Active  Description of Group: In this group patients will discuss what is out of their control, what is somewhat in their control, and what is within their control.  They will be encouraged to explore what issues they can control and what issues are out of their control within their daily lives. They will be guided to discuss their thoughts, feelings, and behaviors related to these issues. The group will process together ways to better control things that are well within our own control and how to notice and accept the things that are not within our control. This group will be process-oriented, with patients participating in exploration of their own experiences as well as giving and receiving support and challenge from other group members.  During this group 2 worksheets will be provided to each patient to follow along and fill out.   Therapeutic Goals: 1. Patient will identify what is within their control and what is not within their control. 2. Patient will identify their thoughts and feelings about having control over their own lives. 3. Patient will identify their thoughts and feelings about not having control over everything in their lives.. 4. Patient will identify ways that they can have more control over their own lives. 5. Patient will identify areas were they can allow others to help them or provide assistance.  Summary of Patient Progress : Patient actively participated in group.  Patient reports that in the past she has used eating sweets as a way to cope with anxiety and sometimes that has been bad for her physical health.  Patient discussed different stressors and what she had and did not have control over in that stressors.  Patient was able to participate in discussion regarding Circle of Control.     Phillips Goulette, LCSW, LCAS Clincal Social Worker  [facility]

## 2021-09-21 NOTE — Progress Notes (Addendum)
D:When asking pt about her falling, pt gave 3 different accounts of her falling. Pt first stated she fell in the shower. Then she stated later that she fell and pulled the shower curtain down . Pt finally stated"what really happened , was I slid sown on my butt and while trying to gt up I fell on my bottom about 3 inches" pt stated she did not have any pain on her bottom, pt stated she did not hit her head. Pt shower was observed with a towel in the front to prevent slipping. Pt complained of Back pain before the fall , pt did not endorse any back pain from the fall.   A: AC-Kim and NP -Roy notified of incident and they came to talk to pt . Pt vital signs taken, pt assessed for injury (stated she did not have any injuries from her fall) . Fall documentation started    09/21/21 2049  Vital Signs  Temp 98.3 F (36.8 C)  Temp Source Oral  Pulse Rate 95  Resp 18  BP (!) 132/97  BP Location Right Wrist  BP Method Automatic  Patient Position (if appropriate) Sitting  Oxygen Therapy  SpO2 97 %  O2 Device Room Air   R: will continue to monitor
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
## 2021-09-21 NOTE — BH IP Treatment Plan (Signed)
Interdisciplinary Treatment and Diagnostic Plan Update  09/21/2021 Time of Session:1000 Kerri Ford MRN: [id number]  Principal Diagnosis: Bipolar I disorder, most recent episode (or current) manic ([place])  Secondary Diagnoses: Principal Problem:   Bipolar I disorder, most recent episode (or current) manic ([place])   Current Medications:  Current Facility-Administered Medications  Medication Dose Route Frequency Provider Last Rate Last Admin   acetaminophen (TYLENOL) tablet 650 mg  650 mg Oral Q6H PRN Briant Cedar, MD   650 mg at 09/21/21 0838   alum & mag hydroxide-simeth (MAALOX/MYLANTA) 200-200-20 MG/5ML suspension 30 mL  30 mL Oral Q4H PRN Briant Cedar, MD       [START ON 09/22/2021] ARIPiprazole (ABILIFY) tablet 15 mg  15 mg Oral Daily Massengill, Nathan, MD       haloperidol (HALDOL) tablet 5 mg  5 mg Oral Q6H PRN Winfred Leeds, Nadir, MD   5 mg at 09/20/21 1322   And   benztropine (COGENTIN) tablet 1 mg  1 mg Oral Q6H PRN Winfred Leeds, Nadir, MD   1 mg at 09/20/21 1322   cloNIDine (CATAPRES) tablet 0.1 mg  0.1 mg Oral Q6H PRN Winfred Leeds, Nadir, MD       divalproex (DEPAKOTE) DR tablet 750 mg  750 mg Oral BID Massengill, Nathan, MD       insulin aspart (novoLOG) injection 0-15 Units  0-15 Units Subcutaneous TID WC Briant Cedar, MD   2 Units at 09/21/21 1755   insulin aspart (novoLOG) injection 0-5 Units  0-5 Units Subcutaneous QHS Briant Cedar, MD   3 Units at 09/20/21 2051   lisinopril (ZESTRIL) tablet 5 mg  5 mg Oral Daily Massengill, Nathan, MD   5 mg at 09/21/21 1039   LORazepam (ATIVAN) tablet 1 mg  1 mg Oral BID Massengill, Ovid Curd, MD   1 mg at 09/21/21 1753   magnesium hydroxide (MILK OF MAGNESIA) suspension 30 mL  30 mL Oral Daily PRN Briant Cedar, MD       metFORMIN (GLUCOPHAGE) tablet 500 mg  500 mg Oral BID WC Massengill, Ovid Curd, MD   500 mg at 09/21/21 1754   propranolol (INDERAL) tablet 10 mg  10 mg Oral Q12H Massengill, Ovid Curd, MD   10 mg at  09/21/21 1039   temazepam (RESTORIL) capsule 15 mg  15 mg Oral QHS,MR X 1 Massengill, Nathan, MD       traZODone (DESYREL) tablet 50 mg  50 mg Oral QHS PRN Briant Cedar, MD   50 mg at 09/20/21 2050   PTA Medications: Medications Prior to Admission  Medication Sig Dispense Refill Last Dose   acetaminophen (TYLENOL) 500 MG tablet Take 500-1,000 mg by mouth every 6 (six) hours as needed for headache.      albuterol (PROVENTIL HFA;VENTOLIN HFA) 108 (90 Base) MCG/ACT inhaler Inhale 1-2 puffs into the lungs every 4 (four) hours as needed for wheezing or shortness of breath. Reported on 10/11/2015 1 Inhaler 3    atorvastatin (LIPITOR) 20 MG tablet Take 20 mg by mouth daily.      blood glucose meter kit and supplies KIT Dispense based on patient and insurance preference. Use up to four times daily as directed. (FOR ICD-9 250.00, 250.01). 1 each 0    Cholecalciferol (VITAMIN D-3 PO) Take 1 tablet by mouth daily.      Continuous Blood Gluc Receiver (FREESTYLE LIBRE 14 DAY READER) DEVI 1 Units by Does not apply route as needed. 1 Device 0    Continuous Blood Gluc Sensor (  FREESTYLE LIBRE 14 DAY SENSOR) MISC 1 Units by Does not apply route every 14 (fourteen) days. 6 each 3    Cyanocobalamin (VITAMIN B-12 PO) Take 1 tablet by mouth daily.      cyclobenzaprine (FLEXERIL) 10 MG tablet Take 10 mg by mouth 3 (three) times daily as needed for muscle spasms.      DULoxetine (CYMBALTA) 60 MG capsule Take 60 mg by mouth 2 (two) times daily.      glipiZIDE (GLUCOTROL) 5 MG tablet Take 5 mg by mouth 2 (two) times daily. For glucose greater than 150.      Insulin Pen Needle (BD PEN NEEDLE NANO U/F) 32G X 4 MM MISC 1 Package by Does not apply route 2 (two) times daily. 100 each 0    levocetirizine (XYZAL) 5 MG tablet Take 5 mg by mouth daily.      loratadine (CLARITIN) 10 MG tablet Take 10 mg by mouth at bedtime.      MAGNESIUM PO Take 1 tablet by mouth daily.      metFORMIN (GLUCOPHAGE) 500 MG tablet TAKE 1  TABLET(500 MG) BY MOUTH TWICE DAILY WITH A MEAL (Patient taking differently: Take 1,000 mg by mouth 2 (two) times daily with a meal.) 180 tablet 0    naproxen sodium (ALEVE) 220 MG tablet Take 220-440 mg by mouth 2 (two) times daily as needed (For headache).      ONE TOUCH ULTRA TEST test strip USE TO TEST FOUR TIMES DAILY AS DIRECTED 100 each 1    ONETOUCH DELICA LANCETS FINE MISC Use up to four times daily as directed due to hyperglycemia, weight change, medication monitoring. 100 each prn    pioglitazone (ACTOS) 45 MG tablet Take 45 mg by mouth daily.      POTASSIUM PO Take 1 tablet by mouth daily.      TURMERIC PO Take 1 capsule by mouth daily.       Patient Stressors: Health problems   Medication change or noncompliance   Traumatic event    Patient Strengths: Religious Affiliation  Supportive family/friends   Treatment Modalities: Medication Management, Group therapy, Case management,  1 to 1 session with clinician, Psychoeducation, Recreational therapy.   Physician Treatment Plan for Primary Diagnosis: Bipolar I disorder, most recent episode (or current) manic ([place]) Long Term Goal(s): Improvement in symptoms so as ready for discharge   Short Term Goals: Ability to identify changes in lifestyle to reduce recurrence of condition will improve Ability to verbalize feelings will improve Ability to demonstrate self-control will improve Ability to identify and develop effective coping behaviors will improve Ability to maintain clinical measurements within normal limits will improve Compliance with prescribed medications will improve Ability to identify triggers associated with substance abuse/mental health issues will improve  Medication Management: Evaluate patient's response, side effects, and tolerance of medication regimen.  Therapeutic Interventions: 1 to 1 sessions, Unit Group sessions and Medication administration.  Evaluation of Outcomes: Progressing  Physician Treatment  Plan for Secondary Diagnosis: Principal Problem:   Bipolar I disorder, most recent episode (or current) manic ([place])  Long Term Goal(s): Improvement in symptoms so as ready for discharge   Short Term Goals: Ability to identify changes in lifestyle to reduce recurrence of condition will improve Ability to verbalize feelings will improve Ability to demonstrate self-control will improve Ability to identify and develop effective coping behaviors will improve Ability to maintain clinical measurements within normal limits will improve Compliance with prescribed medications will improve Ability to identify triggers associated with  substance abuse/mental health issues will improve     Medication Management: Evaluate patient's response, side effects, and tolerance of medication regimen.  Therapeutic Interventions: 1 to 1 sessions, Unit Group sessions and Medication administration.  Evaluation of Outcomes: Progressing   RN Treatment Plan for Primary Diagnosis: Bipolar I disorder, most recent episode (or current) manic ([place]) Long Term Goal(s): Knowledge of disease and therapeutic regimen to maintain health will improve  Short Term Goals: Ability to remain free from injury will improve, Ability to verbalize frustration and anger appropriately will improve, Ability to demonstrate self-control, Ability to participate in decision making will improve, Ability to verbalize feelings will improve, Ability to disclose and discuss suicidal ideas, Ability to identify and develop effective coping behaviors will improve, and Compliance with prescribed medications will improve  Medication Management: RN will administer medications as ordered by provider, will assess and evaluate patient's response and provide education to patient for prescribed medication. RN will report any adverse and/or side effects to prescribing provider.  Therapeutic Interventions: 1 on 1 counseling sessions, Psychoeducation, Medication  administration, Evaluate responses to treatment, Monitor vital signs and CBGs as ordered, Perform/monitor CIWA, COWS, AIMS and Fall Risk screenings as ordered, Perform wound care treatments as ordered.  Evaluation of Outcomes: Progressing   LCSW Treatment Plan for Primary Diagnosis: Bipolar I disorder, most recent episode (or current) manic ([place]) Long Term Goal(s): Safe transition to appropriate next level of care at discharge, Engage patient in therapeutic group addressing interpersonal concerns.  Short Term Goals: Engage patient in aftercare planning with referrals and resources, Increase social support, Increase ability to appropriately verbalize feelings, Increase emotional regulation, Facilitate acceptance of mental health diagnosis and concerns, Facilitate patient progression through stages of change regarding substance use diagnoses and concerns, Identify triggers associated with mental health/substance abuse issues, and Increase skills for wellness and recovery  Therapeutic Interventions: Assess for all discharge needs, 1 to 1 time with Social worker, Explore available resources and support systems, Assess for adequacy in community support network, Educate family and significant other(s) on suicide prevention, Complete Psychosocial Assessment, Interpersonal group therapy.  Evaluation of Outcomes: Progressing   Progress in Treatment: Attending groups: Yes. Participating in groups: Yes. Taking medication as prescribed: Yes. Toleration medication: Yes. Family/Significant other contact made: No, will contact:  csw will obtain consent to reach family/friend.  Patient understands diagnosis: Yes. Discussing patient identified problems/goals with staff: Yes. Medical problems stabilized or resolved: Yes. Denies suicidal/homicidal ideation: Yes. Issues/concerns per patient self-inventory: Yes. Other: none  New problem(s) identified: No, Describe:  none  New Short Term/Long Term Goal(s):  Patient to work towards elimination of symptoms of psychosis, medication management for mood stabilization;  development of comprehensive mental wellness plan.  Patient Goals:  States she would like to "get meds good and go home."   Discharge Plan or Barriers: No psychosocial barriers identified at this time, patient to return to place of residence when appropriate for discharge.   Reason for Continuation of Hospitalization: Mania  Estimated Length of Stay: 1-7 days    Scribe for Treatment Team: Larose Kells 09/21/2021 6:57 PM

## 2021-09-21 NOTE — Progress Notes (Signed)
Pt came to get her HS medications , pt stated she fell in the shower. Fall protocol stated, pt stated she did not hit her head.

## 2021-09-21 NOTE — Progress Notes (Signed)
Adult Psychoeducational Group Note  Date:  09/21/2021 Time:  10:25 PM  Group Topic/Focus:  Wrap-Up Group:   The focus of this group is to help patients review their daily goal of treatment and discuss progress on daily workbooks.  Participation Level:  Did Not Attend  Participation Quality:   Did Not Attend  Affect:   Did Not Attend  Cognitive:   Did Not Attend  Insight: None  Engagement in Group:   Did Not Attend  Modes of Intervention:   Did Not Attend  Additional Comments:  Pt was encouraged to attend wrap up group but did not attend.  Candy Sledge 09/21/2021, 10:25 PM

## 2021-09-21 NOTE — Progress Notes (Signed)
   09/21/21 2045  What Happened  Was fall witnessed? No  Was patient injured? No  Patient found other (Comment) (pt was walking in the hallway)  Found by Staff-comment  Stated prior activity shower  Follow Up  MD notified Channing Mutters  Time MD notified 2057  Family notified No - patient refusal  Additional tests No  Simple treatment Other (comment) (none)  Progress note created (see row info) Yes  Adult Fall Risk Assessment  Risk Factor Category (scoring not indicated) High fall risk per protocol (document High fall risk)  Patient Fall Risk Level High fall risk  Adult Fall Risk Interventions  Required Bundle Interventions *See Row Information* High fall risk - low, moderate, and high requirements implemented  Additional Interventions Assess orthostatic BP  Screening for Fall Injury Risk (To be completed on HIGH fall risk patients) - Assessing Need for Floor Mats  Risk For Fall Injury- Criteria for Floor Mats None identified - No additional interventions needed  Vitals  Temp 98.3 F (36.8 C)  BP (!) 132/97  MAP (mmHg) 107  Patient Position (if appropriate) Sitting  Pulse Rate 98  Oxygen Therapy  SpO2 97 %  O2 Device Room Air  Pain Assessment  Pain Scale 0-10  Pain Score 2  Pain Type Chronic pain  Pain Location Back  Pain Orientation Lower  Pain Intervention(s) Medication (See eMAR)

## 2021-09-21 NOTE — Progress Notes (Signed)
Recreation Therapy Notes  INPATIENT RECREATION THERAPY ASSESSMENT  Patient Details Name: Kerri Ford MRN: [id number] DOB: 1980/11/02 Today's Date: 09/21/2021       Information Obtained From: Patient  Able to Participate in Assessment/Interview: Yes  Patient Presentation:  (Tearful)  Reason for Admission (Per Patient): Other (Comments) (Pt explained doing a prayer with a friend who has different beliefs from her and the friend making jokes about it during the prayer and becoming uncomfortable with it.  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)  Current Use: Yes (but has to be careful of the smoking)  If no, Barriers?:    Expressed Interest in [employer] Information: No  [place] of Residence:  [place]  Patient Main Form of Transportation: Set designer  Patient Strengths:  Honest, Advertising account planner, Purity  Patient Identified Areas of Improvement:  Interior and spatial designer; Understanding when no one gives a crap about me and minding my own business"  Patient Goal for Hospitalization:  "napping"  Current SI (including self-harm):  No  Current HI:  No  Current AVH: No  Staff Intervention Plan: Group Attendance, Collaborate with Interdisciplinary Treatment Team  Consent to Intern Participation: N/A   Caroll Rancher, Richardean Sale, Shaughn Thomley A 09/21/2021, 1:01 PM
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
## 2021-09-21 NOTE — [facility] Group Notes (Signed)
Adult Psychoeducational Group Note  Date:  09/21/2021 Time:  11:12 AM  Group Topic/Focus:  Goals Group:   The focus of this group is to help patients establish daily goals to achieve during treatment and discuss how the patient can incorporate goal setting into their daily lives to aide in recovery.  Participation Level:  Active  Participation Quality:  Attentive  Affect:  Appropriate  Cognitive:  Appropriate  Insight: Appropriate  Engagement in Group:  Engaged  Modes of Intervention:  Discussion  Additional Comments:  Patient attended goals group. Patient's goal was to take her medications and attend all groups.   Kinlee Garrison T Yuvonne Lanahan 09/21/2021, 11:12 AM

## 2021-09-21 NOTE — [facility] Group Notes (Signed)
Adult Psychoeducational Group Note  Date:  09/21/2021 Time:  5:33 PM  Group Topic/Focus:  Wellness Toolbox:   The focus of this group is to discuss various aspects of wellness, balancing those aspects and exploring ways to increase the ability to experience wellness.  Patients will create a wellness toolbox for use upon discharge.  Participation Level:  Active  Participation Quality:  Appropriate  Affect:  Appropriate  Cognitive:  Appropriate  Insight: Appropriate  Engagement in Group:  Engaged  Modes of Intervention:  Activity  Additional Comments:  Patient attended and participated in the relaxation group activity.   Jearl Klinefelter 09/21/2021, 5:33 PM

## 2021-09-21 NOTE — Group Note (Signed)
Recreation Therapy Group Note   Group Topic:Personal Development  Group Date: 09/21/2021 Start Time: 1000 End Time: 1025 Facilitators: Victorino Sparrow, LRT,CTRS Location: [address] Dayroom   Goal Area(s) Addresses:  Patient will define peace. Patient will identify the benefits of peace. Patient will identify ways peace in used in day to day situations.   Group Description: Finding Peace.  LRT discussed with patients the meaning of peace and what brings them peace.  LRT and patients discussed how peace can be anywhere you make it and is used to clear your mind of any worries, concerns or any distractions all together.  Patients were given a worksheet the broke down peace into 5 questions.  The first question addressed positive change, the second addressed positive relationships, the third addressed future goals, the fourth question was about being grateful for non-material things and the last question addressed setting limits.    Affect/Mood: Labile   Participation Level: Engaged   Participation Quality: Independent   Behavior: Appropriate and Tearful   Speech/Thought Process: Focused   Insight: Good   Judgement: Good   Modes of Intervention: Worksheet   Patient Response to Interventions:  Engaged   Education Outcome:  Acknowledges education and In group clarification offered    Clinical Observations/Individualized Feedback: Pt defined peace as "coming to an agreement on things, if we're all going to be here together".  The first question, pt identified a positive change as compromising and admitting defeat.  The second question, pt expressed "looking for willingness to talk things through to reach a happy medium" as a quality in positive relationships.  For the third question, pt identified as goal as "communicating more clearly and having a balanced life".  Pt expressed being grateful for her body, food, shelter and clean water.  Lastly, pt explained she would set limits on  no smoking, having a happy family without breaking the law and food.  During group, pt became upset when peer became uncomfortable with pt looking at her.  Pt expressed feeling guilty, closed her eyes and started crying.  LRT had a difficult time getting patient to refocus.  LRT had to stand in front of pt for her to continue with her answers.       Plan: Continue to engage patient in RT group sessions 2-3x/week.   Victorino Sparrow, LRT,CTRS 09/21/2021 11:42 AM

## 2021-09-21 NOTE — Progress Notes (Signed)
Pt denies SI, HI, VH and pain when assessed. Remains disorganized, tangential with flight of ideas. Endorses +AH "It's mostly quotes from stuff I've read and seen that keep playing in my head". Observed actively conversing with self at intervals this shift. However, pt is cooperative with care and unit routines, compliant with medications as well without discomfort. Visible in scheduled unit groups when prompted. Mood remains labile with both animation and tearfulness at intervals throughout this shift. However, pt is easily redirectable. Safety checks maintained at Q 15 minutes intervals without outburst. Verbal education provided on ordered medications and effects monitored. Pt tolerated fluids, medications and meals well. Safety maintained on and off unit without issues.

## 2021-09-22 LAB — GLUCOSE, CAPILLARY
Glucose-Capillary: 169 mg/dL — ABNORMAL HIGH (ref 70–99)
Glucose-Capillary: 221 mg/dL — ABNORMAL HIGH (ref 70–99)
Glucose-Capillary: 222 mg/dL — ABNORMAL HIGH (ref 70–99)
Glucose-Capillary: 257 mg/dL — ABNORMAL HIGH (ref 70–99)

## 2021-09-22 LAB — URINALYSIS, COMPLETE (UACMP) WITH MICROSCOPIC
Bacteria, UA: NONE SEEN
Bilirubin Urine: NEGATIVE
Glucose, UA: 150 mg/dL — AB
Ketones, ur: 20 mg/dL — AB
Leukocytes,Ua: NEGATIVE
Nitrite: NEGATIVE
Protein, ur: NEGATIVE mg/dL
Specific Gravity, Urine: 1.014 (ref 1.005–1.030)
pH: 5 (ref 5.0–8.0)

## 2021-09-22 MED ORDER — ARIPIPRAZOLE 15 MG PO TABS
25.0000 mg | ORAL_TABLET | Freq: Every day | ORAL | Status: DC
  Administered 2021-09-23: 25 mg via ORAL
  Filled 2021-09-22 (×2): qty 1

## 2021-09-22 MED ORDER — ARIPIPRAZOLE 5 MG PO TABS
5.0000 mg | ORAL_TABLET | Freq: Every day | ORAL | Status: AC
  Administered 2021-09-22: 5 mg via ORAL
  Filled 2021-09-22: qty 1

## 2021-09-22 MED ORDER — HYDROXYZINE HCL 25 MG PO TABS
25.0000 mg | ORAL_TABLET | Freq: Once | ORAL | Status: AC
  Administered 2021-09-22: 25 mg via ORAL
  Filled 2021-09-22 (×2): qty 1
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
## 2021-09-22 NOTE — Group Note (Signed)
Recreation Therapy Group Note   Group Topic:Health and Wellness  Group Date: 09/22/2021 Start Time: [id number] End Time: 1025 Facilitators: Victorino Sparrow, LRT,CTRS Location: [address] Dayroom   Goal Area(s) Addresses:  Patient will define components of whole wellness. Patient will verbalize benefit of whole wellness.  Group Description:  Exercise.  LRT and patients discussed the importance of wellness and the main elements that make it up (mental, physical and spiritual).  LRT then explained to group they would be focusing on the physical aspect with some chair exercises.  LRT led group in a series of stretches before allowing each patient to lead group in an exercise of their choosing.  The group was going for at least 30 minutes of movement.  Patients were encouraged to get water or take breaks if needed.   Affect/Mood: Appropriate   Participation Level: Engaged   Participation Quality: Independent   Behavior: Interactive    Speech/Thought Process: Preoccupied   Insight: Fair   Judgement: Fair    Modes of Intervention: Music and Exercise   Patient Response to Interventions:  Engaged   Education Outcome:  Acknowledges education and In group clarification offered    Clinical Observations/Individualized Feedback: Pt was more focused on singing and doing hand movements to the songs than the actual exercises.  Pt was in her own little world for the most part.  Pt would close her eyes and talk about not offending anyone.      Plan: Continue to engage patient in RT group sessions 2-3x/week.   Victorino Sparrow, Glennis Brink 09/22/2021 12:13 PM

## 2021-09-22 NOTE — Progress Notes (Signed)
   09/22/21 0500  Sleep  Number of Hours 5
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
## 2021-09-22 NOTE — Progress Notes (Signed)
Adult Psychoeducational Group Note  Date:  09/22/2021 Time:  11:08 PM  Group Topic/Focus:  Wrap-Up Group:   The focus of this group is to help patients review their daily goal of treatment and discuss progress on daily workbooks.  Participation Level:  Active  Participation Quality:  Appropriate  Affect:  Appropriate  Cognitive:  Appropriate  Insight: Appropriate  Engagement in Group:  Developing/Improving  Modes of Intervention:  Discussion  Additional Comments:  Pt stated her goal for today was to focus on her treatment plan. Pt stated she accomplished her goal today. Pt stated she talked with her doctor and with her social worker about her care today. Pt rated her overall day a 8 out of 10. Pt stated she was able to contact her sister, and her sister coming for visitation tonight improved her overall day. Pt stated she felt better about herself tonight. Pt stated staff brought back all her meals. Pt stated she took all medications provided today. Pt stated her appetite was pretty good today. Pt rated her sleep last night was pretty good. Pt stated the goal tonight was to get some rest. Pt stated she had some physical pain tonight. Pt stated she had some body pain tonight. Pt rated her mild body pain a 4 on the pain level scale. Pt nurse was updated on the situation. Pt deny visual hallucinations and auditory issues tonight. Pt denies thoughts of harming herself or others. Pt stated she would alert staff if anything changed.  Candy Sledge 09/22/2021, 11:08 PM

## 2021-09-23 LAB — GLUCOSE, CAPILLARY
Glucose-Capillary: 171 mg/dL — ABNORMAL HIGH (ref 70–99)
Glucose-Capillary: 257 mg/dL — ABNORMAL HIGH (ref 70–99)
Glucose-Capillary: 251 mg/dL — ABNORMAL HIGH (ref 70–99)
Glucose-Capillary: 154 mg/dL — ABNORMAL HIGH (ref 70–99)

## 2021-09-23 MED ORDER — ARIPIPRAZOLE 15 MG PO TABS
30.0000 mg | ORAL_TABLET | Freq: Every day | ORAL | Status: DC
  Administered 2021-09-24 – 2021-10-05 (×12): 30 mg via ORAL
  Filled 2021-09-23 (×14): qty 2

## 2021-09-23 MED ORDER — HALOPERIDOL 5 MG PO TABS
5.0000 mg | ORAL_TABLET | Freq: Two times a day (BID) | ORAL | Status: DC
  Administered 2021-09-23 – 2021-09-25 (×4): 5 mg via ORAL
  Filled 2021-09-23 (×6): qty 1

## 2021-09-23 MED ORDER — FLUCONAZOLE 150 MG PO TABS
150.0000 mg | ORAL_TABLET | Freq: Every day | ORAL | Status: AC
  Administered 2021-09-23: 150 mg via ORAL
  Filled 2021-09-23: qty 1

## 2021-09-23 MED ORDER — OLANZAPINE 5 MG PO TABS
5.0000 mg | ORAL_TABLET | Freq: Two times a day (BID) | ORAL | Status: DC | PRN
  Administered 2021-09-25 – 2021-09-27 (×2): 5 mg via ORAL
  Filled 2021-09-23 (×2): qty 1

## 2021-09-23 MED ORDER — CYCLOBENZAPRINE HCL 5 MG PO TABS
5.0000 mg | ORAL_TABLET | Freq: Three times a day (TID) | ORAL | Status: DC | PRN
  Administered 2021-09-23 – 2021-10-05 (×9): 5 mg via ORAL
  Filled 2021-09-23 (×9): qty 1

## 2021-09-23 MED ORDER — HYDROXYZINE HCL 25 MG PO TABS
25.0000 mg | ORAL_TABLET | Freq: Three times a day (TID) | ORAL | Status: DC | PRN
  Administered 2021-09-24 – 2021-10-05 (×13): 25 mg via ORAL
  Filled 2021-09-23 (×14): qty 1

## 2021-09-23 MED ORDER — OLANZAPINE 10 MG IM SOLR: 5.0000 mg | Freq: Two times a day (BID) | INTRAMUSCULAR | Status: DC | PRN
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
## 2021-09-23 NOTE — Group Note (Signed)
LCSW Group Therapy Note   Group Date: 09/23/2021 Start Time: 1300 End Time: 1400   Type of Therapy and Topic:  Group Therapy: Problem Solving   Participation Level:  Did not attend  Description of Group:  Patients identified different skills needed to problem solve.  Patients were able to identify a problem they were having and identify steps into solving that problem including 1. Identify the problem, 2. Generating possible solutions, 3. Evaluating alternatives, 4. Decide on a solution, 5 Implement the solution and 6 Evaluate the outcome.  Patients demonstrated understanding by participating in discussion and assisting peers with solving problems.    Therapeutic Goals:  1. Identify problem solving skills 2. Demonstrate understanding by identifying problem and going through steps of problem solving.     Summary of Patient Progress:    Patient reports that she was not feeling well and chose to not participate in group.   Therapeutic Modalities:   Zachery Conch, LCSW 09/23/2021  1:48 PM

## 2021-09-23 NOTE — BHH Group Notes (Signed)
Adult Psychoeducational Group Note  Date:  09/23/2021 Time:  10:06 AM  Group Topic/Focus:  Goals Group:   The focus of this group is to help patients establish daily goals to achieve during treatment and discuss how the patient can incorporate goal setting into their daily lives to aide in recovery.  Participation Level:  Active  Participation Quality:  Appropriate  Affect:  Appropriate  Cognitive:  Appropriate  Insight: Appropriate  Engagement in Group:  Engaged  Modes of Intervention:  Discussion  Additional Comments:  Patient attended goals group and was attentive the duration of it. Patient's goal was to get a discharge plan.  Kellyanne Ellwanger T Roseland Braun 09/23/2021, 10:06 AM

## 2021-09-23 NOTE — Progress Notes (Signed)
   09/23/21 2000  Psych Admission Type (Psych Patients Only)  Admission Status Voluntary  Psychosocial Assessment  Patient Complaints Anxiety  Eye Contact Fair  Facial Expression Anxious  Affect Appropriate to circumstance  Speech Tangential  Interaction Assertive  Motor Activity Fidgety  Appearance/Hygiene Improved  Behavior Characteristics Cooperative  Mood Suspicious  Aggressive Behavior  Effect No apparent injury  Thought Process  Coherency Disorganized;Flight of ideas  Content Preoccupation;Blaming self  Delusions Religious  Perception Hallucinations  Hallucination Auditory  Judgment Impaired  Confusion Mild  Danger to Self  Current suicidal ideation? Denies  Danger to Others  Danger to Others None reported or observed

## 2021-09-23 NOTE — Hospital Course (Signed)
Kerri Ford is a 41 yr old female who presented on 6/9 to [facility] with her friend [name] Due with disorganized/manic behavior.  PPHx is significant for Depression, Anxiety, PTSD, and Bipolar Disorder, a remote history of self-injurious behavior (Cutting last 11 yrs ago), and 3 previous hospitalizations (college). Patient has had improvement in her psychosis gradually, but is still flordily manic with disorganized thoughts and delusions of grandeur. Patient was switched from her Zyprexa to Abilify due to metabolic syndrome, attempting to max out on Abilify at 30mg  before considering addn antipsychotic. Depakote lvl for Friday, hopefully patient will be ready for d'c around Monday.

## 2021-09-23 NOTE — Progress Notes (Signed)
Pt observed with labile mood tearful, irritable this afternoon, walked out of CSW group early "They woke me up just as I was getting to sleep".  Pt received PRN Haldol, Cogentin and Tylenol as ordered (See EMAR) with minimal effect. Confused, demanding answers from staff "Why the hell am I here, who brought me here, I need answers now. My mouth is very dry as I'm talking to you. I need help". Pt's concerns validated. Assigned provider notified of pt's concerns. New order received for Flexaril 5 mg PO Q 8 hours PRN. Safety checks maintained at Q 15 minutes intervals. Emotional support, reassurance and encouragement provided to pt. All medications administered with verbal education and effects monitored. Pt tolerates all meals, fluids and medications well. Remains verbally redirectable at this time.

## 2021-09-23 NOTE — Progress Notes (Signed)
   09/23/21 0500  Sleep  Number of Hours 5.5

## 2021-09-23 NOTE — Group Note (Signed)
Recreation Therapy Group Note   Group Topic:Communication  Group Date: 09/23/2021 Start Time: 1000 End Time: 1040 Facilitators: Caroll Rancher, LRT,CTRS Location: [address] Dayroom   Goal Area(s) Addresses:  Patient will effectively listen to complete activity.  Patient will identify communication skills used to make activity successful.  Patient will identify how skills used during activity can be used to reach post d/c goals.    Group Description:  Geometric Drawings.  Three volunteers from the peer group will be shown an abstract picture with a particular arrangement of geometrical shapes.  Each round, one 'speaker' will describe the pattern, as accurately as possible without revealing the image to the group.  The remaining group members will listen and draw the picture to reflect how it is described to them. Patients with the role of 'listener' cannot ask clarifying questions but, may request that the speaker repeat a direction. Once the drawings are complete, the presenter will show the rest of the group the picture and compare how close each person came to drawing the picture. LRT will facilitate a post-activity discussion regarding effective communication and the importance of planning, listening, and asking for clarification in daily interactions with others.   Affect/Mood: Appropriate   Participation Level: Active   Participation Quality: Independent   Behavior: Appropriate   Speech/Thought Process: Grandiose   Insight: Moderate   Judgement: Moderate   Modes of Intervention: Activity   Patient Response to Interventions:  Engaged   Education Outcome:  Acknowledges education and In group clarification offered    Clinical Observations/Individualized Feedback: Pt expressed one of the ways people communicate is through drawings.  Pt gave the example of kids using drawings to express their feelings when they don't have the words to express them.  Pt went on to say some  people may interpret those drawings as "something kids want to happen".  During the activity, pt wasn't doing the drawing as the first person presented.  Pt was writing as she stated "a description" of the drawing.  When pt was the presenter, she expressed the number of different shapes in the picture which was confusing to the rest of the patients.  Pt then went back and tried to described the picture to her peers.  Pt didn't give a lot of specifics in her description.  Pt expressed some of the confusion with understanding her instructions was "I talk in nerd/math language".  Pt went on to say "I used too many words for some and not enough for others".  Pt also spoke about how peoples' interpretation of things is different.    Plan: Continue to engage patient in RT group sessions 2-3x/week.   Caroll Rancher A, NT,  09/23/2021 1:51 PM

## 2021-09-24 DIAGNOSIS — R1084 Generalized abdominal pain: Secondary | ICD-10-CM | POA: Insufficient documentation

## 2021-09-24 LAB — GLUCOSE, CAPILLARY
Glucose-Capillary: 179 mg/dL — ABNORMAL HIGH (ref 70–99)
Glucose-Capillary: 264 mg/dL — ABNORMAL HIGH (ref 70–99)
Glucose-Capillary: 315 mg/dL — ABNORMAL HIGH (ref 70–99)
Glucose-Capillary: 318 mg/dL — ABNORMAL HIGH (ref 70–99)

## 2021-09-24 MED ORDER — CALCIUM CARBONATE ANTACID 500 MG PO CHEW
1.0000 | CHEWABLE_TABLET | Freq: Two times a day (BID) | ORAL | Status: DC
  Administered 2021-09-24 – 2021-10-05 (×22): 200 mg via ORAL
  Filled 2021-09-24 (×24): qty 1

## 2021-09-24 MED ORDER — BENZTROPINE MESYLATE 0.5 MG PO TABS
0.5000 mg | ORAL_TABLET | Freq: Two times a day (BID) | ORAL | Status: DC
  Administered 2021-09-24 – 2021-09-26 (×4): 0.5 mg via ORAL
  Filled 2021-09-24 (×11): qty 1

## 2021-09-24 NOTE — Progress Notes (Signed)
Pt comes to the nursing station requesting various things, pt very pessimistic and attention seeking . Pt stated she was in her room for the last 30 minutes no able to sleep, but writer reminded pt again that she was told earlier she could come up for another dose of Restoril if she could not sleep.

## 2021-09-24 NOTE — Group Note (Signed)
Occupational Therapy Group Note   Group Topic:Goal Setting  Group Date: 09/24/2021 Start Time: 1400 End Time: 1445 Facilitators: Ted Mcalpine, OT   Group Description: Group encouraged engagement and participation through discussion focused on goal setting. Group members were introduced to goal-setting using the SMART Goal framework, identifying goals as Specific, Measureable, Acheivable, Relevant, and Time-Bound. Group members took time from group to create their own personal goal reflecting the SMART goal template and shared for review by peers and OT.    In this group therapy session, we will delve into the power of routines and how they can positively impact our mental health and wellbeing. We will explore the concept of approaching routines from a systems and goals perspective, recognizing the interconnectedness of various aspects of our lives and the importance of setting meaningful goals. Together, we will discuss practical ideas and strategies for implementing effective routines in the inpatient setting, as well as maintaining them when transitioning back home and into the community. Through open and supportive dialogue, we will share personal experiences, challenges, and successes, allowing each participant to gain valuable insights and tools for fostering positive change in their lives. This discussion aims to empower and inspire individuals to embrace routines as a pathway to improved mental health and overall wellbeing.   Therapeutic Goal(s):  Identify at least one goal that fits the SMART framework    Participation Level: Active   Participation Quality: Independent   Behavior: Appropriate   Speech/Thought Process: Coherent and Directed   Affect/Mood: Appropriate   Insight: Fair   Judgement: Fair   Individualization: pt was active in their participation of group discussion/activity. New skills were identified  Modes of Intervention: Discussion and Education  Patient  Response to Interventions:  Attentive   Plan: Continue to engage patient in OT groups 2 - 3x/week.  09/24/2021  Ted Mcalpine, OT Kerrin Champagne, OT

## 2021-09-24 NOTE — Group Note (Signed)
Recreation Therapy Group Note   Group Topic:Team Building  Group Date: 09/24/2021 Start Time: 1000 End Time: 1020 Facilitators: Caroll Rancher, LRT,CTRS Location: [address] Dayroom   Goal Area(s) Addresses:  Patient will effectively work with peer towards shared goal.  Patient will identify skills used to make activity successful.  Patient will identify how skills used during activity can be used to reach post d/c goals.   Group Description:  Straw Bridge. In teams of 3-5, patients were given 15 plastic drinking straws and an equal length of masking tape. Using the materials provided, patients were instructed to build a free standing bridge-like structure to suspend an everyday item (ex: puzzle box) off of the floor or table surface. All materials were required to be used by the team in their design. LRT facilitated post-activity discussion reviewing team process. Patients were encouraged to reflect how the skills used in this activity can be generalized to daily life post discharge.    Affect/Mood: Appropriate   Participation Level: Engaged and Hyperverbal   Participation Quality: Independent   Behavior: Impulsive   Speech/Thought Process: Disorganized and Flight of ideas   Insight: Moderate   Judgement: Moderate   Modes of Intervention: STEM Activity   Patient Response to Interventions:  Engaged   Education Outcome:  Acknowledges education and In group clarification offered    Clinical Observations/Individualized Feedback: Pt was indecisive with coming up with an idea for the project with her partner.  Pt couldn't stay on one idea and was constantly bringing up different ideas every few seconds.  Pt explained she felt she was making her partner frustrated with her inability to settle on an idea.  Pt also stated "my brain was exploding with ideas".  When dealing with her support system, pt stated her friend tells her "we have situation we are dealing with and we have to come  up with a solution, so pick one".  Pt went on to explain sometimes when working on a plan, you may have to start over.    Plan: Continue to engage patient in RT group sessions 2-3x/week.   Caroll Rancher, Antonietta Jewel 09/24/2021 12:24 PM

## 2021-09-24 NOTE — Progress Notes (Signed)
   09/24/21 1100  Psych Admission Type (Psych Patients Only)  Admission Status Voluntary  Psychosocial Assessment  Patient Complaints Anxiety  Eye Contact Fair  Facial Expression Anxious  Affect Appropriate to circumstance  Speech Tangential  Interaction Assertive  Motor Activity Slow  Appearance/Hygiene Improved  Behavior Characteristics Appropriate to situation  Mood Preoccupied  Thought Process  Coherency Disorganized  Content Preoccupation  Delusions Paranoid  Perception Hallucinations  Hallucination Auditory  Judgment Impaired  Confusion None  Danger to Self  Current suicidal ideation? Denies  Danger to Others  Danger to Others None reported or observed

## 2021-09-24 NOTE — Progress Notes (Signed)
   09/24/21 0500  Sleep  Number of Hours 7

## 2021-09-24 NOTE — Progress Notes (Signed)
   09/24/21 2000  Psych Admission Type (Psych Patients Only)  Admission Status Voluntary  Psychosocial Assessment  Patient Complaints Anxiety  Eye Contact Fair  Facial Expression Anxious  Affect Appropriate to circumstance  Speech Tangential  Interaction Assertive;Needy;Attention-seeking  Motor Activity Fidgety  Appearance/Hygiene Improved  Behavior Characteristics Appropriate to situation  Mood Preoccupied  Aggressive Behavior  Effect No apparent injury  Thought Process  Coherency Disorganized;Flight of ideas  Content Preoccupation;Blaming self  Delusions Religious  Perception Hallucinations  Hallucination Auditory  Judgment Impaired  Confusion Mild  Danger to Self  Current suicidal ideation? Denies  Danger to Others  Danger to Others None reported or observed

## 2021-09-24 NOTE — Inpatient Diabetes Management (Signed)
Inpatient Diabetes Program Recommendations  AACE/ADA: New Consensus Statement on Inpatient Glycemic Control (2015)  Target Ranges:  Prepandial:   less than 140 mg/dL      Peak postprandial:   less than 180 mg/dL (1-2 hours)      Critically ill patients:  140 - 180 mg/dL    Latest Reference Range & Units 09/23/21 06:14 09/23/21 11:57 09/23/21 17:08 09/23/21 19:52  Glucose-Capillary 70 - 99 mg/dL 833 (H)  3 units Novolog  Metformin  257 (H)  8 units Novolog  251 (H)  8 units Novolog  Metformin  154 (H)  (H): Data is abnormally high  Latest Reference Range & Units 09/24/21 05:50  Glucose-Capillary 70 - 99 mg/dL 383 (H)  3 units Novolog   (H): Data is abnormally high     Home DM Meds: Glipizide 5 mg BID        Metformin 1000 mg BID        Actos 45 mg Daily  Current Orders: Novolog Moderate Correction Scale/ SSI (0-15 units) TID AC + HS      Metformin 500 mg BID    MD- Please consider adding back home dose of Glipizide 5 mg BID    --Will follow patient during hospitalization--  Ambrose Finland RN, MSN, CDE Diabetes Coordinator Inpatient Glycemic Control Team Team Pager: [phone] (8a-5p)
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
## 2021-09-24 NOTE — [facility] Group Notes (Signed)
Adult Psychoeducational Group Note  Date:  09/24/2021 Time:  8:51 PM  Group Topic/Focus:  Wrap-Up Group:   The focus of this group is to help patients review their daily goal of treatment and discuss progress on daily workbooks.  Participation Level:  Active  Participation Quality:  Appropriate  Affect:  Appropriate  Cognitive:  Appropriate  Insight: Appropriate  Engagement in Group:  Engaged  Modes of Intervention:  Discussion  Additional Comments:  Patient attended and participated in the Wrap-up group.  Jearl Klinefelter 09/24/2021, 8:51 PM

## 2021-09-25 ENCOUNTER — Encounter (HOSPITAL_COMMUNITY): Payer: Self-pay

## 2021-09-25 LAB — BASIC METABOLIC PANEL
Anion gap: 9 (ref 5–15)
BUN: 14 mg/dL (ref 6–20)
CO2: 28 mmol/L (ref 22–32)
Calcium: 9.6 mg/dL (ref 8.9–10.3)
Chloride: 101 mmol/L (ref 98–111)
Creatinine, Ser: 0.65 mg/dL (ref 0.44–1.00)
GFR, Estimated: >60 mL/min (ref >60)
Glucose, Bld: 294 mg/dL — ABNORMAL HIGH (ref 70–99)
Potassium: 4.2 mmol/L (ref 3.5–5.1)
Sodium: 138 mmol/L (ref 135–145)

## 2021-09-25 LAB — VALPROIC ACID LEVEL: Valproic Acid Lvl: 80 ug/mL (ref 50.0–100.0)

## 2021-09-25 LAB — GLUCOSE, CAPILLARY
Glucose-Capillary: 273 mg/dL — ABNORMAL HIGH (ref 70–99)
Glucose-Capillary: 359 mg/dL — ABNORMAL HIGH (ref 70–99)
Glucose-Capillary: 283 mg/dL — ABNORMAL HIGH (ref 70–99)
Glucose-Capillary: 264 mg/dL — ABNORMAL HIGH (ref 70–99)

## 2021-09-25 MED ORDER — GLIPIZIDE 5 MG PO TABS
5.0000 mg | ORAL_TABLET | Freq: Two times a day (BID) | ORAL | Status: DC
  Administered 2021-09-25 – 2021-10-05 (×20): 5 mg via ORAL
  Filled 2021-09-25 (×22): qty 1

## 2021-09-25 MED ORDER — HALOPERIDOL 2 MG PO TABS
7.0000 mg | ORAL_TABLET | Freq: Every day | ORAL | Status: DC
  Administered 2021-09-25: 7 mg via ORAL
  Filled 2021-09-25 (×3): qty 1

## 2021-09-25 MED ORDER — HALOPERIDOL 5 MG PO TABS
5.0000 mg | ORAL_TABLET | Freq: Every day | ORAL | Status: DC
  Administered 2021-09-26 – 2021-09-28 (×3): 5 mg via ORAL
  Filled 2021-09-25 (×5): qty 1

## 2021-09-25 NOTE — Group Note (Signed)
Recreation Therapy Group Note   Group Topic:Self-Esteem  Group Date: 09/25/2021 Start Time: 1005 End Time: 1040 Facilitators: Caroll Rancher, LRT,CTRS Location: [address] Dayroom   Goal Area(s) Addresses:  Patient will successfully identify positive attributes about themselves.  Patient will identify healthy ways to increase self-esteem. Patient will acknowledge benefit(s) of improved self-esteem.   Group Description:  Radiation protection practitioner.  LRT and patients discussed the importance of having positive self esteem.  Patients were then given a worksheet of an outline of a picture frame.  Patients were to create an image of themselves, things they like and things they are proud of within the mirror.  Patients shared their mirrors with the group when completed.   Affect/Mood: Appropriate   Participation Level: Engaged   Participation Quality: Independent   Behavior: Appropriate   Speech/Thought Process: Focused   Insight: Good   Judgement: Good   Modes of Intervention: Art   Patient Response to Interventions:  Engaged   Education Outcome:  Acknowledges education and In group clarification offered    Clinical Observations/Individualized Feedback: Pt was appropriate and able to focus during group session.  Pt expressed on her picture a small farm where she would like to grow vegetable and live one day.  Pt also showed a stream water flowing outside of her garden.  Pt also showed a like for cats and coffee.  Pt expressed wanting to own a farm in the future and expressed enjoying the activity and being able to express herself.    Plan: Continue to engage patient in RT group sessions 2-3x/week.   Caroll Rancher, LRT,CTRS 09/25/2021 1:09 PM

## 2021-09-25 NOTE — BH IP Treatment Plan (Signed)
Interdisciplinary Treatment and Diagnostic Plan Update  09/25/2021 Time of Session: update Kerri Ford MRN: [id number]  Principal Diagnosis: Bipolar I disorder, most recent episode (or current) manic ([place])  Secondary Diagnoses: Principal Problem:   Bipolar I disorder, most recent episode (or current) manic ([place]) Active Problems:   PTSD (post-traumatic stress disorder)   Fibromyalgia   Current Medications:  Current Facility-Administered Medications  Medication Dose Route Frequency Provider Last Rate Last Admin   acetaminophen (TYLENOL) tablet 650 mg  650 mg Oral Q6H PRN Briant Cedar, MD   650 mg at 09/25/21 0949   alum & mag hydroxide-simeth (MAALOX/MYLANTA) 200-200-20 MG/5ML suspension 30 mL  30 mL Oral Q4H PRN Briant Cedar, MD   30 mL at 09/25/21 3354   ARIPiprazole (ABILIFY) tablet 30 mg  30 mg Oral Daily Massengill, Ovid Curd, MD   30 mg at 09/25/21 0743   benztropine (COGENTIN) tablet 0.5 mg  0.5 mg Oral BID Damita Dunnings B, MD   0.5 mg at 09/25/21 0743   calcium carbonate (TUMS - dosed in mg elemental calcium) chewable tablet 200 mg of elemental calcium  1 tablet Oral BID WC McQuilla, Joyce Gross B, MD   200 mg of elemental calcium at 09/25/21 0744   cloNIDine (CATAPRES) tablet 0.1 mg  0.1 mg Oral Q6H PRN Winfred Leeds, Nadir, MD   0.1 mg at 09/21/21 2245   cyclobenzaprine (FLEXERIL) tablet 5 mg  5 mg Oral Q8H PRN Massengill, Ovid Curd, MD   5 mg at 09/23/21 1756   divalproex (DEPAKOTE) DR tablet 750 mg  750 mg Oral BID Massengill, Ovid Curd, MD   750 mg at 09/25/21 0743   glipiZIDE (GLUCOTROL) tablet 5 mg  5 mg Oral BID AC Damita Dunnings B, MD       [START ON 09/26/2021] haloperidol (HALDOL) tablet 5 mg  5 mg Oral Daily McQuilla, Jai B, MD       haloperidol (HALDOL) tablet 7 mg  7 mg Oral Q2000 Damita Dunnings B, MD       hydrOXYzine (ATARAX) tablet 25 mg  25 mg Oral TID PRN Evette Georges, NP   25 mg at 09/24/21 2256   insulin aspart (novoLOG) injection 0-15 Units  0-15 Units  Subcutaneous TID WC Briant Cedar, MD   15 Units at 09/25/21 1156   insulin aspart (novoLOG) injection 0-5 Units  0-5 Units Subcutaneous QHS Briant Cedar, MD   4 Units at 09/24/21 2045   lisinopril (ZESTRIL) tablet 5 mg  5 mg Oral Daily Massengill, Ovid Curd, MD   5 mg at 09/25/21 0743   LORazepam (ATIVAN) tablet 1 mg  1 mg Oral BID Massengill, Ovid Curd, MD   1 mg at 09/25/21 0743   magnesium hydroxide (MILK OF MAGNESIA) suspension 30 mL  30 mL Oral Daily PRN Briant Cedar, MD       metFORMIN (GLUCOPHAGE) tablet 500 mg  500 mg Oral BID WC Massengill, Ovid Curd, MD   500 mg at 09/25/21 0743   OLANZapine (ZYPREXA) injection 5 mg  5 mg Intramuscular BID PRN Massengill, Ovid Curd, MD       OLANZapine (ZYPREXA) tablet 5 mg  5 mg Oral BID PRN Massengill, Ovid Curd, MD       propranolol (INDERAL) tablet 10 mg  10 mg Oral Q12H Massengill, Ovid Curd, MD   10 mg at 09/25/21 0743   temazepam (RESTORIL) capsule 15 mg  15 mg Oral QHS,MR X 1 Massengill, Nathan, MD   15 mg at 09/24/21 2258   traZODone (DESYREL) tablet 50  mg  50 mg Oral QHS PRN Briant Cedar, MD   50 mg at 09/24/21 2043   PTA Medications: Medications Prior to Admission  Medication Sig Dispense Refill Last Dose   acetaminophen (TYLENOL) 500 MG tablet Take 500-1,000 mg by mouth every 6 (six) hours as needed for headache.      albuterol (PROVENTIL HFA;VENTOLIN HFA) 108 (90 Base) MCG/ACT inhaler Inhale 1-2 puffs into the lungs every 4 (four) hours as needed for wheezing or shortness of breath. Reported on 10/11/2015 1 Inhaler 3    atorvastatin (LIPITOR) 20 MG tablet Take 20 mg by mouth daily.      blood glucose meter kit and supplies KIT Dispense based on patient and insurance preference. Use up to four times daily as directed. (FOR ICD-9 250.00, 250.01). 1 each 0    Cholecalciferol (VITAMIN D-3 PO) Take 1 tablet by mouth daily.      Continuous Blood Gluc Receiver (FREESTYLE LIBRE 14 DAY READER) DEVI 1 Units by Does not apply route as  needed. 1 Device 0    Continuous Blood Gluc Sensor (FREESTYLE LIBRE 14 DAY SENSOR) MISC 1 Units by Does not apply route every 14 (fourteen) days. 6 each 3    Cyanocobalamin (VITAMIN B-12 PO) Take 1 tablet by mouth daily.      cyclobenzaprine (FLEXERIL) 10 MG tablet Take 10 mg by mouth 3 (three) times daily as needed for muscle spasms.      DULoxetine (CYMBALTA) 60 MG capsule Take 60 mg by mouth 2 (two) times daily.      glipiZIDE (GLUCOTROL) 5 MG tablet Take 5 mg by mouth 2 (two) times daily. For glucose greater than 150.      Insulin Pen Needle (BD PEN NEEDLE NANO U/F) 32G X 4 MM MISC 1 Package by Does not apply route 2 (two) times daily. 100 each 0    levocetirizine (XYZAL) 5 MG tablet Take 5 mg by mouth daily.      loratadine (CLARITIN) 10 MG tablet Take 10 mg by mouth at bedtime.      MAGNESIUM PO Take 1 tablet by mouth daily.      metFORMIN (GLUCOPHAGE) 500 MG tablet TAKE 1 TABLET(500 MG) BY MOUTH TWICE DAILY WITH A MEAL (Patient taking differently: Take 1,000 mg by mouth 2 (two) times daily with a meal.) 180 tablet 0    naproxen sodium (ALEVE) 220 MG tablet Take 220-440 mg by mouth 2 (two) times daily as needed (For headache).      ONE TOUCH ULTRA TEST test strip USE TO TEST FOUR TIMES DAILY AS DIRECTED 100 each 1    ONETOUCH DELICA LANCETS FINE MISC Use up to four times daily as directed due to hyperglycemia, weight change, medication monitoring. 100 each prn    pioglitazone (ACTOS) 45 MG tablet Take 45 mg by mouth daily.      POTASSIUM PO Take 1 tablet by mouth daily.      TURMERIC PO Take 1 capsule by mouth daily.       Patient Stressors: Health problems   Medication change or noncompliance   Traumatic event    Patient Strengths: Religious Affiliation  Supportive family/friends   Treatment Modalities: Medication Management, Group therapy, Case management,  1 to 1 session with clinician, Psychoeducation, Recreational therapy.   Physician Treatment Plan for Primary Diagnosis:  Bipolar I disorder, most recent episode (or current) manic ([place]) Long Term Goal(s): Improvement in symptoms so as ready for discharge   Short Term Goals: Ability to identify  changes in lifestyle to reduce recurrence of condition will improve Ability to verbalize feelings will improve Ability to demonstrate self-control will improve Ability to identify and develop effective coping behaviors will improve Ability to maintain clinical measurements within normal limits will improve Compliance with prescribed medications will improve Ability to identify triggers associated with substance abuse/mental health issues will improve  Medication Management: Evaluate patient's response, side effects, and tolerance of medication regimen.  Therapeutic Interventions: 1 to 1 sessions, Unit Group sessions and Medication administration.  Evaluation of Outcomes: Progressing  Physician Treatment Plan for Secondary Diagnosis: Principal Problem:   Bipolar I disorder, most recent episode (or current) manic ([place]) Active Problems:   PTSD (post-traumatic stress disorder)   Fibromyalgia  Long Term Goal(s): Improvement in symptoms so as ready for discharge   Short Term Goals: Ability to identify changes in lifestyle to reduce recurrence of condition will improve Ability to verbalize feelings will improve Ability to demonstrate self-control will improve Ability to identify and develop effective coping behaviors will improve Ability to maintain clinical measurements within normal limits will improve Compliance with prescribed medications will improve Ability to identify triggers associated with substance abuse/mental health issues will improve     Medication Management: Evaluate patient's response, side effects, and tolerance of medication regimen.  Therapeutic Interventions: 1 to 1 sessions, Unit Group sessions and Medication administration.  Evaluation of Outcomes: Progressing   RN Treatment Plan for  Primary Diagnosis: Bipolar I disorder, most recent episode (or current) manic ([place]) Long Term Goal(s): Knowledge of disease and therapeutic regimen to maintain health will improve  Short Term Goals: Ability to remain free from injury will improve, Ability to verbalize frustration and anger appropriately will improve, Ability to demonstrate self-control, Ability to participate in decision making will improve, Ability to verbalize feelings will improve, Ability to disclose and discuss suicidal ideas, Ability to identify and develop effective coping behaviors will improve, and Compliance with prescribed medications will improve  Medication Management: RN will administer medications as ordered by provider, will assess and evaluate patient's response and provide education to patient for prescribed medication. RN will report any adverse and/or side effects to prescribing provider.  Therapeutic Interventions: 1 on 1 counseling sessions, Psychoeducation, Medication administration, Evaluate responses to treatment, Monitor vital signs and CBGs as ordered, Perform/monitor CIWA, COWS, AIMS and Fall Risk screenings as ordered, Perform wound care treatments as ordered.  Evaluation of Outcomes: Progressing   LCSW Treatment Plan for Primary Diagnosis: Bipolar I disorder, most recent episode (or current) manic ([place]) Long Term Goal(s): Safe transition to appropriate next level of care at discharge, Engage patient in therapeutic group addressing interpersonal concerns.  Short Term Goals: Engage patient in aftercare planning with referrals and resources, Increase social support, Increase ability to appropriately verbalize feelings, Increase emotional regulation, Facilitate acceptance of mental health diagnosis and concerns, Facilitate patient progression through stages of change regarding substance use diagnoses and concerns, Identify triggers associated with mental health/substance abuse issues, and Increase skills for  wellness and recovery  Therapeutic Interventions: Assess for all discharge needs, 1 to 1 time with Social worker, Explore available resources and support systems, Assess for adequacy in community support network, Educate family and significant other(s) on suicide prevention, Complete Psychosocial Assessment, Interpersonal group therapy.  Evaluation of Outcomes: Progressing   Progress in Treatment: Attending groups: Yes. Participating in groups: Yes. Taking medication as prescribed: Yes. Toleration medication: Yes. Family/Significant other contact made: Yes, individual(s) contacted:  friend, Angie Patient understands diagnosis: Yes. Discussing patient identified problems/goals with staff:  Yes. Medical problems stabilized or resolved: Yes. Denies suicidal/homicidal ideation: Yes. Issues/concerns per patient self-inventory: No.   New problem(s) identified: No, Describe:  none reported  New Short Term/Long Term Goal(s):   medication stabilization, elimination of SI thoughts, development of comprehensive mental wellness plan.    Patient Goals:  Patient continues to want to work toward initial goal of "get meds good and go home"  Discharge Plan or Barriers: Patient recently admitted. CSW will continue to follow and assess for appropriate referrals and possible discharge planning.    Reason for Continuation of Hospitalization: Mania  Estimated Length of Stay: 3-5 days  Last 3 [place] Suicide Severity Risk Score: [place] Admission (Current) from 09/19/2021 in [place] [facility]B  C-SSRS RISK CATEGORY No Risk       Last PHQ 2/9 Scores:    11/25/2017    2:15 PM 11/21/2017   11:19 AM 11/08/2017    3:52 PM  Depression screen PHQ 2/9  Decreased Interest 0 0 0  Down, Depressed, Hopeless 0 0 0  PHQ - 2 Score 0 0 0    Scribe for Treatment Team: Zachery Conch, LCSW 09/25/2021 3:41 PM
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
## 2021-09-25 NOTE — Progress Notes (Signed)
   09/25/21 1945  Psych Admission Type (Psych Patients Only)  Admission Status Voluntary  Psychosocial Assessment  Patient Complaints Anxiety  Eye Contact Fair  Facial Expression Anxious  Affect Appropriate to circumstance  Speech Tangential  Interaction Assertive;Needy;Attention-seeking  Motor Activity Fidgety  Appearance/Hygiene Improved  Behavior Characteristics Cooperative  Mood Suspicious;Preoccupied  Aggressive Behavior  Effect No apparent injury  Thought Process  Coherency Disorganized;Flight of ideas  Content Preoccupation;Blaming self  Delusions Religious  Perception Hallucinations  Hallucination Auditory  Judgment Impaired  Confusion Mild  Danger to Self  Current suicidal ideation? Denies  Danger to Others  Danger to Others None reported or observed

## 2021-09-25 NOTE — Progress Notes (Signed)
Pt continues to be very needy , wanting staff time. Pt stated she went to the bathroom and thought she was finished and got up and "peed on myself , I think I have a UTI" pt encouraged to talk to the doctor tomorrow, pt sated stated she had heartburn and was given Maalox per [facility]

## 2021-09-25 NOTE — Progress Notes (Signed)
Adult Psychoeducational Group Note  Date:  09/25/2021 Time:  8:44 PM  Group Topic/Focus:  Wrap-Up Group:   The focus of this group is to help patients review their daily goal of treatment and discuss progress on daily workbooks.  Participation Level:  Minimal  Participation Quality:  Appropriate  Affect:  Blunted  Cognitive:  Oriented  Insight: Limited  Engagement in Group:  Engaged  Modes of Intervention:  Education and Exploration  Additional Comments:  Patient attended and participated in group tonight. She reports that today she learn that she needs to sleep she she would not be cranky and judge others wrongfully  Kerri Ford 09/25/2021, 8:44 PM

## 2021-09-25 NOTE — Progress Notes (Signed)
   09/25/21 0500  Sleep  Number of Hours 4.5

## 2021-09-25 NOTE — Progress Notes (Signed)
   09/25/21 1100  Psych Admission Type (Psych Patients Only)  Admission Status Voluntary  Psychosocial Assessment  Patient Complaints Anxiety  Eye Contact Fair  Facial Expression Anxious  Affect Appropriate to circumstance  Speech Tangential  Interaction Assertive;Attention-seeking;Needy  Motor Activity Fidgety  Appearance/Hygiene Improved  Behavior Characteristics Appropriate to situation  Mood Preoccupied  Aggressive Behavior  Effect No apparent injury  Thought Process  Coherency Disorganized;Flight of ideas  Content Preoccupation  Delusions Religious  Perception Hallucinations  Hallucination Auditory  Judgment Impaired  Confusion Mild  Danger to Self  Current suicidal ideation? Denies  Agreement Not to Harm Self Yes  Description of Agreement Verbal  Danger to Others  Danger to Others None reported or observed

## 2021-09-25 NOTE — Progress Notes (Addendum)
Patient became agitated during the shift. Signed a 72 hour form at 1830. Patient states that she is hearing people laughing at her and calling her fat. Patient walking around with her fists clenched.Patient was given prn Zyprexa for agitation.

## 2021-09-25 NOTE — Inpatient Diabetes Management (Signed)
Inpatient Diabetes Program Recommendations  AACE/ADA: New Consensus Statement on Inpatient Glycemic Control (2015)  Target Ranges:  Prepandial:   less than 140 mg/dL      Peak postprandial:   less than 180 mg/dL (1-2 hours)      Critically ill patients:  140 - 180 mg/dL   Lab Results  Component Value Date   GLUCAP 273 (H) 09/25/2021   HGBA1C 10.7 (H) 09/18/2021    Review of Glycemic Control  Latest Reference Range & Units 09/24/21 05:50 09/24/21 11:32 09/24/21 16:58 09/24/21 19:43 09/25/21 05:56  Glucose-Capillary 70 - 99 mg/dL 413 (H) 244 (H) 010 (H) 318 (H) 273 (H)  (H): Data is abnormally high  Home DM Meds: Glipizide 5 mg BID                              Metformin 1000 mg BID                              Actos 45 mg Daily   Current Orders: Novolog Moderate Correction Scale/ SSI (0-15 units) TID AC + HS                            Metformin 500 mg BID       MD- Please consider adding back home dose of Glipizide 5 mg BID Secure chat to resident Dr. Morrie Sheldon and RN.  Thank you, Billy Fischer. Malayshia All, RN, MSN, CDE  Diabetes Coordinator Inpatient Glycemic Control Team Team Pager [phone] (8am-5pm) 09/25/2021 9:56 AM

## 2021-09-25 NOTE — [facility] Group Notes (Signed)
[facility] Group Notes:  (Nursing/MHT/Case Management/Adjunct)  Date:  09/25/2021  Time:  9:41 AM  Type of Therapy:   Orientation/Goals group  Participation Level:  Active  Participation Quality:  Appropriate  Affect:  Appropriate  Cognitive:  Appropriate  Insight:  Appropriate  Engagement in Group:  Engaged and Improving  Modes of Intervention:  Discussion, Education, Orientation, and Support  Summary of Progress/Problems: Pt goal for today is to find balance, making good nutrition choices, resting and reading.   Harlea Goetzinger J Larri Brewton 09/25/2021, 9:41 AM

## 2021-09-25 NOTE — Group Note (Signed)
LCSW Group Therapy Note   Group Date: 09/25/2021 Start Time: 1300 End Time: 1400   Type of Therapy and Topic:  Group Therapy: Boundaries  Participation Level:  Active  Description of Group: This group will address the use of boundaries in their personal lives. Patients will explore why boundaries are important, the difference between healthy and unhealthy boundaries, and negative and postive outcomes of different boundaries and will look at how boundaries can be crossed.  Patients will be encouraged to identify current boundaries in their own lives and identify what kind of boundary is being set. Facilitators will guide patients in utilizing problem-solving interventions to address and correct types boundaries being used and to address when no boundary is being used. Understanding and applying boundaries will be explored and addressed for obtaining and maintaining a balanced life. Patients will be encouraged to explore ways to assertively make their boundaries and needs known to significant others in their lives, using other group members and facilitator for role play, support, and feedback.  Therapeutic Goals:  1.  Patient will identify areas in their life where setting clear boundaries could be  used to improve their life.  2.  Patient will identify signs/triggers that a boundary is not being respected. 3.  Patient will identify two ways to set boundaries in order to achieve balance in  their lives: 4.  Patient will demonstrate ability to communicate their needs and set boundaries  through discussion and/or role plays  Summary of Patient Progress:  Kerri Ford was present/active throughout the session and proved open to feedback from CSW and peers. Patient demonstrated good insight into the subject matter, was respectful of peers, and was present throughout the entire session. Patient provided good feedback to peers to assist with action steps in creating healthy boundaries. She shared with the  group that a boundary she is implementing is boundaries during celebrations with family members since they tend to trigger her mental health.  Therapeutic Modalities:   Cognitive Behavioral Therapy Solution-Focused Therapy  Beatris Si, LCSWA 09/25/2021  11:54 AM

## 2021-09-26 LAB — URINALYSIS, COMPLETE (UACMP) WITH MICROSCOPIC
Bacteria, UA: NONE SEEN
Bilirubin Urine: NEGATIVE
Glucose, UA: >=500 mg/dL — AB
Ketones, ur: 5 mg/dL — AB
Leukocytes,Ua: NEGATIVE
Nitrite: NEGATIVE
Protein, ur: NEGATIVE mg/dL
Specific Gravity, Urine: 1.005 (ref 1.005–1.030)
pH: 5 (ref 5.0–8.0)

## 2021-09-26 LAB — GLUCOSE, CAPILLARY
Glucose-Capillary: 159 mg/dL — ABNORMAL HIGH (ref 70–99)
Glucose-Capillary: 280 mg/dL — ABNORMAL HIGH (ref 70–99)
Glucose-Capillary: 345 mg/dL — ABNORMAL HIGH (ref 70–99)
Glucose-Capillary: 255 mg/dL — ABNORMAL HIGH (ref 70–99)

## 2021-09-26 MED ORDER — BENZTROPINE MESYLATE 0.5 MG PO TABS
0.5000 mg | ORAL_TABLET | Freq: Two times a day (BID) | ORAL | Status: DC | PRN
Start: 2021-09-26 — End: 2021-09-30

## 2021-09-26 MED ORDER — HALOPERIDOL 5 MG PO TABS
10.0000 mg | ORAL_TABLET | Freq: Every day | ORAL | Status: DC
  Administered 2021-09-26 – 2021-10-04 (×9): 10 mg via ORAL
  Filled 2021-09-26 (×10): qty 2
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
## 2021-09-26 NOTE — Progress Notes (Signed)
Adult Psychoeducational Group Note  Date:  09/26/2021 Time:  8:51 PM  Group Topic/Focus:  Wrap-Up Group:   The focus of this group is to help patients review their daily goal of treatment and discuss progress on daily workbooks.  Participation Level:  Active  Participation Quality:  Appropriate and Attentive  Affect:  Appropriate  Cognitive:  Alert and Appropriate  Insight: Appropriate  Engagement in Group:  Engaged  Modes of Intervention:  Discussion  Additional Comments:   Pt was tangential during group discussion. Pt confirmed slight feelings of anxiety but denies everything else. Pt seemed drowsy during group. Pt states that one of her goals is to focus on treatment and develop a support team around her. Pt has been observed having emotional outbursts and has been exhibiting labile mood.  Vevelyn Pat 09/26/2021, 8:51 PM

## 2021-09-26 NOTE — [facility] Group Notes (Signed)
.  Psychoeducational Group Note  Date: 09/26/2021 Time: 0900-1000    Goal Setting   Purpose of Group: This group helps to provide patients with the steps of setting a goal that is specific, measurable, attainable, realistic and time specific. A discussion on how we keep ourselves stuck with negative self talk. Homework given for Patients to write 30 positive attributes about themselves.    Participation Level:  Did not attend  Kerri Ford A 

## 2021-09-26 NOTE — Progress Notes (Signed)
   09/26/21 0800  Psych Admission Type (Psych Patients Only)  Admission Status Voluntary  Psychosocial Assessment  Patient Complaints Anxiety  Eye Contact Fair  Facial Expression Anxious  Affect Appropriate to circumstance  Speech Tangential  Interaction Assertive;Attention-seeking  Motor Activity Fidgety  Appearance/Hygiene Improved  Behavior Characteristics Cooperative  Mood Suspicious;Preoccupied  Aggressive Behavior  Effect No apparent injury  Thought Process  Coherency Disorganized;Flight of ideas  Content Preoccupation;Blaming others  Delusions Religious  Perception Hallucinations  Hallucination Auditory  Judgment Impaired  Confusion Mild  Danger to Self  Current suicidal ideation? Denies  Agreement Not to Harm Self Yes  Description of Agreement Verbal  Danger to Others  Danger to Others None reported or observed

## 2021-09-26 NOTE — Group Note (Deleted)
LCSW Group Therapy Note   Group Date: 09/26/2021 Start Time: 1000 End Time: 1100   Type of Therapy and Topic:  Group Therapy:   Participation Level:  {[facility] PARTICIPATION LEVEL:22264}  Description of Group:   Therapeutic Goals:  1.     Summary of Patient Progress:    ***  Therapeutic Modalities:   Natlie Asfour J Grossman-Orr, LCSWA 09/26/2021  9:41 AM    
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
## 2021-09-26 NOTE — Progress Notes (Signed)
   09/26/21 0515  Sleep  Number of Hours 7

## 2021-09-27 LAB — GLUCOSE, CAPILLARY
Glucose-Capillary: 196 mg/dL — ABNORMAL HIGH (ref 70–99)
Glucose-Capillary: 250 mg/dL — ABNORMAL HIGH (ref 70–99)
Glucose-Capillary: 281 mg/dL — ABNORMAL HIGH (ref 70–99)
Glucose-Capillary: 329 mg/dL — ABNORMAL HIGH (ref 70–99)

## 2021-09-27 NOTE — [facility] Group Notes (Signed)
Adult Psychoeducational Group Not Date:  09/27/2021 Time:  0900-1045 Group Topic/Focus: PROGRESSIVE RELAXATION. A group where deep breathing is taught and tensing and relaxation muscle groups is used. Imagery is used as well.  Pts are asked to imagine 3 pillars that hold them up when they are not able to hold themselves up and to share that with the group.  Participation Level:  Active  Participation Quality:  Appropriate  Affect:  Appropriate  Cognitive:  Oriented  Insight: Improving  Engagement in Group:  Engaged  Modes of Intervention:  Activity, Discussion, Education, and Support  Additional Comments:  Rates energy at a 7/10. States faith, hope and love hold her up.  Dione Housekeeper

## 2021-09-27 NOTE — Group Note (Signed)
[facility] Group Notes: (Clinical Social Work)   09/27/2021      Type of Therapy:  Group Therapy   Participation Level:  Did Not Attend - was invited both individually by M[facility] and by overhead announcement, chose not to attend.  When the patient arrived into group for the last 30 seconds, she suddenly burst into tears.   Ambrose Mantle, LCSW 09/27/2021, 1:43 PM

## 2021-09-27 NOTE — Progress Notes (Signed)
Adult Psychoeducational Group Note  Date:  09/27/2021 Time:  8:37 PM  Group Topic/Focus:  Wrap-Up Group:   The focus of this group is to help patients review their daily goal of treatment and discuss progress on daily workbooks.  Participation Level:  Minimal  Participation Quality:  Attentive  Affect:  Labile and Tearful  Cognitive:  Alert  Insight: Lacking  Engagement in Group:  Lacking and Poor  Modes of Intervention:  Discussion  Additional Comments:   Pt was tearful and tangential during group discussion. Pt states that she had a good conversation with her doctors today and was able to get a clearer plan for how her treatment would go. One of her goals is to continue communication with her care providers post D/C   Vevelyn Pat 09/27/2021, 8:37 PM
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
## 2021-09-27 NOTE — Progress Notes (Signed)
Pt continues to be needy on the unit, stating various things in the dayroom trigger "my PTSD" pt informed to spend time in her room because we can not turn the TV off in the dayroom and can not tell people to stop talking in the dayroom because that is where patients and staff interact.     09/27/21 2230  Psych Admission Type (Psych Patients Only)  Admission Status Voluntary  Psychosocial Assessment  Patient Complaints Anxiety;Crying spells  Eye Contact Fair  Facial Expression Anxious  Affect Anxious  Speech Rapid;Pressured  Interaction Assertive;Attention-seeking  Motor Activity Fidgety  Appearance/Hygiene Unremarkable  Behavior Characteristics Anxious  Mood Labile;Anxious;Preoccupied  Aggressive Behavior  Effect No apparent injury  Thought Process  Coherency Disorganized;Flight of ideas  Content Preoccupation;Blaming others  Delusions Religious  Perception Hallucinations  Hallucination Auditory  Judgment Impaired  Confusion None  Danger to Self  Current suicidal ideation? Denies

## 2021-09-27 NOTE — Progress Notes (Signed)
Patient reports high anxiety and insomnia, she continues to blame others and complains about anything and her care here. She did take her medications on shift. She remains sad, depressed and anxious. Patient is currently resting in bed at this time.

## 2021-09-27 NOTE — Progress Notes (Signed)
   09/27/21 0800  Psych Admission Type (Psych Patients Only)  Admission Status Voluntary  Psychosocial Assessment  Patient Complaints Anxiety  Eye Contact Brief  Facial Expression Anxious;Blank  Affect Anxious  Speech Pressured;Word salad  Interaction Assertive;Attention-seeking;Needy  Motor Activity Fidgety  Appearance/Hygiene Unremarkable  Behavior Characteristics Cooperative;Anxious  Mood Anxious;Labile;Preoccupied  Aggressive Behavior  Effect No apparent injury  Thought Process  Coherency Disorganized;Flight of ideas  Content Blaming others;Preoccupation  Delusions Religious  Perception Hallucinations  Hallucination Auditory  Judgment Impaired  Confusion None  Danger to Self  Current suicidal ideation? Denies  Agreement Not to Harm Self Yes  Description of Agreement Verbal  Danger to Others  Danger to Others None reported or observed

## 2021-09-28 LAB — GLUCOSE, CAPILLARY
Glucose-Capillary: 191 mg/dL — ABNORMAL HIGH (ref 70–99)
Glucose-Capillary: 292 mg/dL — ABNORMAL HIGH (ref 70–99)
Glucose-Capillary: 287 mg/dL — ABNORMAL HIGH (ref 70–99)
Glucose-Capillary: 344 mg/dL — ABNORMAL HIGH (ref 70–99)

## 2021-09-28 MED ORDER — TEMAZEPAM 15 MG PO CAPS: 30.0000 mg | ORAL_CAPSULE | Freq: Every evening | ORAL | Status: DC | PRN

## 2021-09-28 MED ORDER — HALOPERIDOL 5 MG PO TABS
10.0000 mg | ORAL_TABLET | Freq: Every day | ORAL | Status: DC
Start: 2021-09-29 — End: 2021-10-05
  Administered 2021-09-29 – 2021-10-05 (×7): 10 mg via ORAL
  Filled 2021-09-28 (×8): qty 2

## 2021-09-28 MED ORDER — TEMAZEPAM 15 MG PO CAPS
30.0000 mg | ORAL_CAPSULE | Freq: Every day | ORAL | Status: DC
  Administered 2021-09-28 – 2021-09-30 (×3): 30 mg via ORAL
  Filled 2021-09-28 (×3): qty 2

## 2021-09-28 NOTE — B[facility] Counselor (Signed)
CSW received a voicemail from patient friend, Kerri Ford.  Angie reports that patient still seems disorganized and is not at baseline.  She reports she got a call from patient stating that she would be getting out on Monday.  Angie is hoping for her to continue treatment inpatient.  Angie reports that brother visited her last night and she was very irritable and not at baseline   [employer], LCSW, LCAS Clincal Social Worker  [facility]

## 2021-09-28 NOTE — Progress Notes (Signed)
Inpatient Diabetes Program Recommendations  AACE/ADA: New Consensus Statement on Inpatient Glycemic Control (2015)  Target Ranges:  Prepandial:   less than 140 mg/dL      Peak postprandial:   less than 180 mg/dL (1-2 hours)      Critically ill patients:  140 - 180 mg/dL   Lab Results  Component Value Date   GLUCAP 292 (H) 09/28/2021   HGBA1C 10.7 (H) 09/18/2021    Review of Glycemic Control  Latest Reference Range & Units 09/27/21 06:25 09/27/21 12:11 09/27/21 17:08 09/27/21 19:32 09/28/21 06:12 09/28/21 11:37  Glucose-Capillary 70 - 99 mg/dL 224 (H) 825 (H) 003 (H) 329 (H) 191 (H) 292 (H)  (H): Data is abnormally high  Diabetes history: DM2 Outpatient Diabetes medications: Glipizide 5 mg BID, Metformin 1000 mg BID, Actos 45 mg QD Current orders for Inpatient glycemic control: Novolog 0-15 units TID and 0-5 units QHS, Metformin 500 mg BID, Glipizide 5 mg BID  Inpatient Diabetes Program Recommendations:    Novolog 3 units TID with meals if consumes at least 50%  Will continue to follow while inpatient.  Thank you, Dulce Sellar, MSN, CDCES Diabetes Coordinator Inpatient Diabetes Program [phone] (team pager from 8a-5p)

## 2021-09-28 NOTE — Progress Notes (Signed)
   09/28/21 0500  Sleep  Number of Hours 6.5

## 2021-09-28 NOTE — Progress Notes (Signed)
Nursing Note: 0700-1900  D:   Goal for today: "I want to develop my discharge plan. I want to go back to my apartment, hug my cats." Pt intermittently pacing around unit, c/o her nails being too long, vaginal itch, gas, bloating and sounds being to loud in milieu.  Pt reports that she slept well last night, appetite is fair and is tolerating prescribed medication without side effects.  Rates that anxiety is  4/10 and depression 3/10 this am.   A:  Earplugs given to pt and allowed her to file her nails. Pt. encouraged to verbalize needs and concerns, active listening and support provided.  Continued Q 15 minute safety checks.  Observed active participation in group settings. R:  Pt. is anxious, pleasant and cooperative.  Denies A/V hallucinations and is able to verbally contract for safety.   09/28/21 0800  Psych Admission Type (Psych Patients Only)  Admission Status Voluntary  Psychosocial Assessment  Patient Complaints Anxiety  Eye Contact Fair  Facial Expression Anxious  Affect Anxious  Speech Rapid;Pressured  Interaction Assertive;Attention-seeking;Intrusive  Motor Activity Fidgety  Appearance/Hygiene Unremarkable  Behavior Characteristics Anxious;Fidgety;Intrusive  Mood Labile;Preoccupied;Anxious  Thought Chartered certified accountant of ideas;Disorganized  Content Preoccupation;Blaming others  Delusions Other (Comment)  Perception WDL  Hallucination None reported or observed  Judgment Poor  Confusion None  Danger to Self  Current suicidal ideation? Denies  Agreement Not to Harm Self Yes  Description of Agreement Verbal.  Danger to Others  Danger to Others None reported or observed
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
## 2021-09-28 NOTE — Progress Notes (Signed)
Pt appeared better, pt did not have any crying spells this evening    09/28/21 2100  Psych Admission Type (Psych Patients Only)  Admission Status Voluntary  Psychosocial Assessment  Patient Complaints Anxiety  Eye Contact Fair  Facial Expression Anxious  Affect Anxious  Speech Rapid;Pressured  Interaction Assertive;Attention-seeking  Motor Activity Fidgety  Appearance/Hygiene Unremarkable  Behavior Characteristics Anxious;Fidgety  Mood Labile;Preoccupied  Aggressive Behavior  Effect No apparent injury  Thought Process  Coherency Disorganized;Flight of ideas  Content Preoccupation;Blaming others  Delusions Religious  Perception Hallucinations  Hallucination Auditory  Judgment Impaired  Confusion None  Danger to Self  Current suicidal ideation? Denies

## 2021-09-28 NOTE — BHH Group Notes (Signed)
Adult Psychoeducational Group Note  Date:  09/28/2021 Time:  8:40 PM  Group Topic/Focus:  Wrap-Up Group:   The focus of this group is to help patients review their daily goal of treatment and discuss progress on daily workbooks.  Participation Level:  Active  Participation Quality:  Attentive  Affect:  Appropriate  Cognitive:  Alert  Insight: Appropriate  Engagement in Group:  Engaged  Modes of Intervention:  Discussion  Additional Comments:  Patient attended and participated in the Wrap-Up group.  Jearl Klinefelter 09/28/2021, 8:40 PM

## 2021-09-28 NOTE — Group Note (Signed)
Recreation Therapy Group Note   Group Topic:Problem Solving  Group Date: 09/28/2021 Start Time: 1005 End Time: 1040 Facilitators: Caroll Rancher, LRT,CTRS Location: [address] Dayroom   Goal Area(s) Addresses:  Patient will effectively work with peer towards shared goal.  Patient will identify skills used to make activity successful.  Patient will share challenges and verbalize solution-driven approaches used. Patient will identify how skills used during activity can be used to reach post d/c goals.   Group Description:  [employer]. Patients were provided the following materials: 2 drinking straws, 5 rubber bands, 5 paper clips, 2 index cards and 2 drinking cups. Using the provided materials patients were asked to build a launching mechanism to launch a ping pong ball across the room, approximately 10 feet. Patients were divided into teams of 3-5. Instructions required all materials be incorporated into the device, functionality of items left to the peer group's discretion.   Affect/Mood: Appropriate   Participation Level: Engaged   Participation Quality: Independent   Behavior: Appropriate   Speech/Thought Process: Focused   Insight: Good   Judgement: Good   Modes of Intervention: Problem-solving   Patient Response to Interventions:  Engaged   Education Outcome:  Acknowledges education and In group clarification offered    Clinical Observations/Individualized Feedback: Pt worked well with peer.  Pt was more of the leader role in the group.  Pt was attentive to suggestions of peer and would ask peer about some of the ideas she was having as well.  Pt also expressed she would have liked time to use a spoon to help sling the ball across the room.  Pt was appropriate and engaged throughout group session.     Plan: Continue to engage patient in RT group sessions 2-3x/week.   Caroll Rancher, LRT,CTRS 09/28/2021 11:43 AM

## 2021-09-28 NOTE — BHH Counselor (Signed)
CSW spoke with patient support, Angie, and verified with her that she would not be discharging today.  CSW agreed to call when we have an updated discharge date. CSW reviewed medications that patient was on and allowed support person to ask any questions about patient treatment.  Angie agreed to call if anything else comes up.   Piccola Arico, LCSW, LCAS Clincal Social Worker  [facility]

## 2021-09-28 NOTE — Group Note (Signed)
LCSW Group Therapy Note  Group Date: 09/28/2021 Start Time: 1300 End Time: 1400   Type of Therapy and Topic:  Group Therapy - How To Cope with Nervousness about Discharge   Participation Level:  Active   Description of Group This process group involved identification of patients' feelings about discharge. Some of them are scheduled to be discharged soon, while others are new admissions, but each of them was asked to share thoughts and feelings surrounding discharge from the hospital. One common theme was that they are excited at the prospect of going home, while another was that many of them are apprehensive about sharing why they were hospitalized. Patients were given the opportunity to discuss these feelings with their peers in preparation for discharge.  Therapeutic Goals  Patient will identify their overall feelings about pending discharge. Patient will think about how they might proactively address issues that they believe will once again arise once they get home (i.e. with parents). Patients will participate in discussion about having hope for change.   Summary of Patient Progress:  Sagal was very active throughout the session. She demonstrated good insight into the subject matter, and proved open to input from peers and feedback from CSW. Brandy was respectful of peers and participated throughout the entire session. Patient would talk off topic occasionally but was easily redirected.    Therapeutic Modalities Cognitive Behavioral Therapy   Beatris Si, LCSW 09/28/2021  1:51 PM

## 2021-09-29 LAB — CBC WITH DIFFERENTIAL/PLATELET
Abs Immature Granulocytes: 0.05 10*3/uL (ref 0.00–0.07)
Basophils Absolute: 0.1 10*3/uL (ref 0.0–0.1)
Basophils Relative: 1 %
Eosinophils Absolute: 0.2 10*3/uL (ref 0.0–0.5)
Eosinophils Relative: 3 %
HCT: 42.8 % (ref 36.0–46.0)
Hemoglobin: 13.9 g/dL (ref 12.0–15.0)
Immature Granulocytes: 1 %
Lymphocytes Relative: 34 %
Lymphs Abs: 2.7 10*3/uL (ref 0.7–4.0)
MCH: 29.7 pg (ref 26.0–34.0)
MCHC: 32.5 g/dL (ref 30.0–36.0)
MCV: 91.5 fL (ref 80.0–100.0)
Monocytes Absolute: 0.6 10*3/uL (ref 0.1–1.0)
Monocytes Relative: 7 %
Neutro Abs: 4.5 10*3/uL (ref 1.7–7.7)
Neutrophils Relative %: 54 %
Platelets: 350 10*3/uL (ref 150–400)
RBC: 4.68 MIL/uL (ref 3.87–5.11)
RDW: 12.9 % (ref 11.5–15.5)
WBC: 8.2 10*3/uL (ref 4.0–10.5)
nRBC: 0 % (ref 0.0–0.2)

## 2021-09-29 LAB — COMPREHENSIVE METABOLIC PANEL
ALT: 45 U/L — ABNORMAL HIGH (ref 0–44)
AST: 24 U/L (ref 15–41)
Albumin: 4 g/dL (ref 3.5–5.0)
Alkaline Phosphatase: 65 U/L (ref 38–126)
Anion gap: 10 (ref 5–15)
BUN: 16 mg/dL (ref 6–20)
CO2: 27 mmol/L (ref 22–32)
Calcium: 10 mg/dL (ref 8.9–10.3)
Chloride: 99 mmol/L (ref 98–111)
Creatinine, Ser: 0.76 mg/dL (ref 0.44–1.00)
GFR, Estimated: >60 mL/min (ref >60)
Glucose, Bld: 343 mg/dL — ABNORMAL HIGH (ref 70–99)
Potassium: 4.4 mmol/L (ref 3.5–5.1)
Sodium: 136 mmol/L (ref 135–145)
Total Bilirubin: 0.5 mg/dL (ref 0.3–1.2)
Total Protein: 7.3 g/dL (ref 6.5–8.1)

## 2021-09-29 LAB — GLUCOSE, CAPILLARY
Glucose-Capillary: 247 mg/dL — ABNORMAL HIGH (ref 70–99)
Glucose-Capillary: 277 mg/dL — ABNORMAL HIGH (ref 70–99)
Glucose-Capillary: 297 mg/dL — ABNORMAL HIGH (ref 70–99)
Glucose-Capillary: 327 mg/dL — ABNORMAL HIGH (ref 70–99)

## 2021-09-29 MED ORDER — INSULIN ASPART 100 UNIT/ML IJ SOLN
3.0000 [IU] | Freq: Three times a day (TID) | INTRAMUSCULAR | Status: DC
  Administered 2021-09-29 – 2021-10-02 (×9): 3 [IU] via SUBCUTANEOUS

## 2021-09-29 MED ORDER — FLUCONAZOLE 100 MG PO TABS
100.0000 mg | ORAL_TABLET | Freq: Every day | ORAL | Status: DC
  Administered 2021-09-29 – 2021-10-05 (×7): 100 mg via ORAL
  Filled 2021-09-29 (×8): qty 1

## 2021-09-29 MED ORDER — LORAZEPAM 0.5 MG PO TABS
0.5000 mg | ORAL_TABLET | Freq: Two times a day (BID) | ORAL | Status: DC
  Administered 2021-09-29 – 2021-10-01 (×4): 0.5 mg via ORAL
  Filled 2021-09-29 (×4): qty 1

## 2021-09-29 NOTE — Progress Notes (Signed)
Inpatient Diabetes Program Recommendations  AACE/ADA: New Consensus Statement on Inpatient Glycemic Control (2015)  Target Ranges:  Prepandial:   less than 140 mg/dL      Peak postprandial:   less than 180 mg/dL (1-2 hours)      Critically ill patients:  140 - 180 mg/dL   Lab Results  Component Value Date   GLUCAP 247 (H) 09/29/2021   HGBA1C 10.7 (H) 09/18/2021    Review of Glycemic Control  Latest Reference Range & Units 09/28/21 06:12 09/28/21 11:37 09/28/21 16:56 09/28/21 20:01 09/29/21 06:10  Glucose-Capillary 70 - 99 mg/dL 834 (H) 196 (H) 222 (H) 344 (H) 247 (H)  (H): Data is abnormally high  Diabetes history: DM2 Outpatient Diabetes medications: Glipizide 5 mg BID, Metformin 1000 mg BID, Actos 45 mg QD Current orders for Inpatient glycemic control: Novolog 0-15 units TID and 0-5 units QHS, Metformin 500 mg BID, Glipizide 5 mg BID   Inpatient Diabetes Program Recommendations:     Novolog 3 units TID with meals if consumes at least 50%  Will continue to follow while inpatient.  Thank you, Dulce Sellar, MSN, CDCES Diabetes Coordinator Inpatient Diabetes Program [phone] (team pager from 8a-5p)

## 2021-09-29 NOTE — Group Note (Signed)
Recreation Therapy Group Note   Group Topic:Healthy Decision Making  Group Date: 09/29/2021 Start Time: 1004 End Time: 1610 Facilitators: Victorino Sparrow, LRT,CTRS Location: [address] Dayroom   Goal Area(s) Addresses:  Patient will effectively work with peer towards shared goal.  Patient will identify factors that guided their decision making.  Patient will pro-socially communicate ideas during group session.    Group Description:  Patients were given a scenario that they were going to be stranded on a deserted [place] for several months before being rescued. Writer tasked them with making a list of 15 things they would choose to bring with them for "survival". The list of items was prioritized most important to least. Each patient would come up with their own list, then work together to create a new list of 15 items while in a group of 3-5 peers. LRT discussed each person's list and how it differed from others. The debrief included discussion of priorities, good decisions versus bad decisions, and how it is important to think before acting so we can make the best decision possible. LRT tied the concept of effective communication among group members to patient's support systems outside of the hospital and its benefit post discharge.   Affect/Mood: Appropriate   Participation Level: Engaged   Participation Quality: Independent   Behavior: Appropriate and Hyperverbal   Speech/Thought Process: Focused   Insight: Good   Judgement: Good   Modes of Intervention: Group work   Patient Response to Interventions:  Engaged   Education Outcome:  Acknowledges education and In group clarification offered    Clinical Observations/Individualized Feedback: Pt was very involved and detailed with her descriptions of things.  Some of the things pt came up with for her list were a bible, survival shovel kit, water purifier, swiss army knife, LED head light and freeze dried food.  Pt was very  instrumental with peers in coming up their joint list.  Pt did most of the talking but did let peers get a word in.  Pt expressed she was very excited about the activity because it's something she dreams of doing when she can't sleep.      Plan: Continue to engage patient in RT group sessions 2-3x/week.   Victorino Sparrow, LRT,CTRS 09/29/2021 10:55 AM

## 2021-09-29 NOTE — Progress Notes (Signed)
   09/29/21 0800  Psych Admission Type (Psych Patients Only)  Admission Status Voluntary  Psychosocial Assessment  Patient Complaints Anxiety  Eye Contact Fair  Facial Expression Anxious  Affect Anxious  Speech Rapid;Pressured  Interaction Assertive;Attention-seeking  Motor Activity Fidgety  Appearance/Hygiene Unremarkable  Behavior Characteristics Anxious;Fidgety  Mood Labile;Preoccupied  Aggressive Behavior  Effect No apparent injury  Thought Process  Coherency Disorganized;Flight of ideas  Content Preoccupation  Delusions None reported or observed  Perception Derealization  Hallucination None reported or observed  Judgment Impaired  Confusion Moderate  Danger to Self  Current suicidal ideation? Denies  Self-Injurious Behavior Obsessive/ruminative self-injurious and active non-lethal gesturing observed or expressed   Agreement Not to Harm Self Yes  Description of Agreement Verbal  Danger to Others  Danger to Others None reported or observed
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
## 2021-09-29 NOTE — Progress Notes (Signed)
   09/29/21 2000  Psych Admission Type (Psych Patients Only)  Admission Status Voluntary  Psychosocial Assessment  Patient Complaints Anxiety  Eye Contact Fair  Facial Expression Anxious  Affect Anxious  Speech Rapid;Pressured  Interaction Assertive;Attention-seeking  Motor Activity Fidgety  Appearance/Hygiene Unremarkable  Behavior Characteristics Anxious  Mood Labile;Preoccupied  Aggressive Behavior  Effect No apparent injury  Thought Process  Coherency Disorganized;Flight of ideas  Content Preoccupation;Blaming others  Delusions Religious  Perception Hallucinations  Hallucination Auditory  Judgment Impaired  Confusion None  Danger to Self  Current suicidal ideation? Denies

## 2021-09-29 NOTE — Group Note (Signed)
Recreation Therapy Group Note   Group Topic:Animal Assisted Therapy   Group Date: 09/29/2021 Start Time: 1430 End Time: 1515 Facilitators: Caroll Rancher, LRT,CTRS Location: [address] Dayroom   Animal-Assisted Activity (AAA) Program Checklist/Progress Notes Patient Eligibility Criteria Checklist & Daily Group note for Rec Tx Intervention  AAA/T Program Assumption of Risk Form signed by Patient/ or Parent Legal Guardian Yes  Patient is free of allergies or severe asthma Yes  Patient reports no fear of animals Yes  Patient reports no history of cruelty to animals Yes  Patient understands his/her participation is voluntary Yes  Patient washes hands before animal contact Yes  Patient washes hands after animal contact Yes   Affect/Mood: Labile   Participation Level: Engaged    Clinical Observations/Individualized Feedback: Pt started off sitting on the floor engaging with the therapy dog.  Pt would ask questions and speak with her eyes closed.  Pt eventually sat at the table where she would share and started crying.  Pt shared about her love of pets and asked questions of the therapy dog team as well.    Plan: Continue to engage patient in RT group sessions 2-3x/week.   Caroll Rancher, LRT,CTRS 09/29/2021 4:16 PM

## 2021-09-29 NOTE — Progress Notes (Signed)
   09/29/21 0500  Sleep  Number of Hours 5.75

## 2021-09-30 ENCOUNTER — Encounter (HOSPITAL_COMMUNITY): Payer: Self-pay

## 2021-09-30 LAB — GLUCOSE, CAPILLARY
Glucose-Capillary: 249 mg/dL — ABNORMAL HIGH (ref 70–99)
Glucose-Capillary: 313 mg/dL — ABNORMAL HIGH (ref 70–99)
Glucose-Capillary: 344 mg/dL — ABNORMAL HIGH (ref 70–99)
Glucose-Capillary: 306 mg/dL — ABNORMAL HIGH (ref 70–99)

## 2021-09-30 MED ORDER — BENZTROPINE MESYLATE 0.5 MG PO TABS
0.5000 mg | ORAL_TABLET | Freq: Two times a day (BID) | ORAL | Status: DC
  Administered 2021-09-30 – 2021-10-02 (×4): 0.5 mg via ORAL
  Filled 2021-09-30 (×8): qty 1

## 2021-09-30 MED ORDER — HALOPERIDOL DECANOATE 100 MG/ML IM SOLN
100.0000 mg | Freq: Once | INTRAMUSCULAR | Status: AC
Start: 2021-09-30 — End: 2021-09-30
  Administered 2021-09-30: 100 mg via INTRAMUSCULAR
  Filled 2021-09-30: qty 1

## 2021-09-30 NOTE — Group Note (Signed)
LCSW Group Therapy  Type of Therapy and Topic:  Group Therapy: Thoughts, Feelings, and Actions  Participation Level:  Active   Description of Group:   In this group, each patient discussed their previous experiencing and understanding of overthinking, identifying the harmful impact on their lives. As a group, each patient was introduced to the basic concepts of Cognitive Behavioral Therapy: that thoughts, feelings, and actions are all connected and influence one another. They were given examples of how overthinking can affect our feelings, actions, and vise versa. The group was then asked to analyze how overthinking was harmful and brainstorm alternative thinking patterns/reactions to the example situation. Then, each group member filled out and identified their own example situation in which a problem situation caused their thoughts, feelings, and actions to be negatively impacted; they were asked to come up with 3 new (more adaptive/positive) thoughts that led to 3 new feelings and actions.  Therapeutic Goals: Patients will review and discuss their past experience with overthinking. Patients will learn the basics of the CBT model through group-led examples.. Patients will identify situations where they may have negative thoughts, feelings, or actions and will then reframe the situation using more positive thoughts to react differently.  Summary of Patient Progress:  The patient shared that they have always had the core belief that "I am permanently damaged." Patient contributed to the discussion of how thoughts, feelings, and actions interact, noting when they may have experienced a negative thought pattern and recognized it as harmful. They were attentive when other patients shared their experiences, and worked to reframe their own thoughts in an activity to identify future situations where they may typically overthink.  Therapeutic Modalities:   Cognitive Behavioral  Therapy Mindfulness  Beatris Si, LCSW 09/30/2021  1:55 PM
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
## 2021-09-30 NOTE — Progress Notes (Signed)
   09/30/21 2000  Psych Admission Type (Psych Patients Only)  Admission Status Voluntary  Psychosocial Assessment  Patient Complaints Anxiety;Depression  Eye Contact Fair  Facial Expression Anxious  Affect Anxious  Speech Rapid;Pressured  Interaction Assertive;Attention-seeking  Motor Activity Fidgety  Appearance/Hygiene Unremarkable  Behavior Characteristics Anxious  Mood Anxious;Depressed  Aggressive Behavior  Effect No apparent injury  Thought Process  Coherency Disorganized;Flight of ideas  Content Preoccupation;Blaming others  Delusions Religious  Perception Hallucinations  Hallucination Auditory  Judgment Impaired  Confusion None  Danger to Self  Current suicidal ideation? Denies

## 2021-09-30 NOTE — Progress Notes (Signed)
Psychoeducational Group Note  Date:  09/30/2021 Time:  2032  Group Topic/Focus:  Wrap-Up Group:   The focus of this group is to help patients review their daily goal of treatment and discuss progress on daily workbooks.  Participation Level: Did Not Attend  Participation Quality:  Not Applicable  Affect:  Not Applicable  Cognitive:  Not Applicable  Insight:  Not Applicable  Engagement in Group: Not Applicable  Additional Comments:  The patient did not attend group this evening.   Hazle Coca S 09/30/2021, 8:32 PM

## 2021-09-30 NOTE — Progress Notes (Signed)
   09/30/21 0530  Sleep  Number of Hours 8.75    

## 2021-09-30 NOTE — Group Note (Signed)
Recreation Therapy Group Note   Group Topic:Leisure Education  Group Date: 09/30/2021 Start Time: 1008 End Time: 1045 Facilitators: Caroll Rancher, LRT,CTRS Location: [address] Day[address]   Goal Area(s) Addresses:  Patient will successfully identify positive leisure and recreation activities.  Patient will acknowledge benefits of participation in healthy leisure activities post discharge.  Patient will actively work with peers toward a shared goal.   Group Description:  Pictionary. In groups of 5-7, patients took turns trying to guess the picture being drawn on the board by their teammate.  If the team guessed the correct answer, they won a point.  If the team guessed wrong, the other team got a chance to steal the point. After several rounds of game play, the team with the most points were declared winners. Post-activity discussion reviewed benefits of positive recreation outlets: reducing stress, improving coping mechanisms, increasing self-esteem, and building larger support systems.    Affect/Mood: Appropriate   Participation Level: Engaged   Participation Quality: Independent   Behavior: Appropriate   Speech/Thought Process: Focused   Insight: Good   Judgement: Good   Modes of Intervention: Game   Patient Response to Interventions:  Engaged   Education Outcome:  Acknowledges education and In group clarification offered    Clinical Observations/Individualized Feedback: Pt came in late but joined in the activity.  Pt still had moments were she would close her eyes while engaging.  Pt was able to remain on topic and not drift off into long diatribes about various topics.  Pt was appropriate during the course of the group session.  Pt expressed some of her leisure interests were fishing, reading, walking and being away from people sometimes.   Plan: Continue to engage patient in RT group sessions 2-3x/week.   Caroll Rancher, LRT,CTRS 09/30/2021 1:25 PM

## 2021-09-30 NOTE — Progress Notes (Signed)
   09/30/21 1000  Psych Admission Type (Psych Patients Only)  Admission Status Voluntary  Psychosocial Assessment  Patient Complaints Anxiety;Depression;Irritability  Eye Contact Poor  Facial Expression Anxious;Sad;Worried  Affect Anxious;Depressed  Speech Pressured  Interaction Attention-seeking;Assertive;Needy  Motor Activity Fidgety  Appearance/Hygiene Unremarkable  Behavior Characteristics Anxious  Mood Anxious;Depressed;Labile;Preoccupied  Aggressive Behavior  Effect No apparent injury  Thought Process  Coherency Disorganized;Flight of ideas  Content Blaming others;Preoccupation  Delusions Religious  Perception Hallucinations  Hallucination Auditory  Judgment Impaired  Confusion None  Danger to Self  Current suicidal ideation? Denies  Self-Injurious Behavior No self-injurious ideation or behavior indicators observed or expressed   Agreement Not to Harm Self Yes  Description of Agreement Verbal  Danger to Others  Danger to Others None reported or observed

## 2021-09-30 NOTE — BH IP Treatment Plan (Signed)
Interdisciplinary Treatment and Diagnostic Plan Update  09/30/2021 Time of Session: 10:20am  Kerri Ford MRN: [id number]  Principal Diagnosis: Bipolar I disorder, most recent episode (or current) manic ([place])  Secondary Diagnoses: Principal Problem:   Bipolar I disorder, most recent episode (or current) manic ([place]) Active Problems:   PTSD (post-traumatic stress disorder)   Fibromyalgia   Current Medications:  Current Facility-Administered Medications  Medication Dose Route Frequency Provider Last Rate Last Admin   acetaminophen (TYLENOL) tablet 650 mg  650 mg Oral Q6H PRN Briant Cedar, MD   650 mg at 09/30/21 0758   alum & mag hydroxide-simeth (MAALOX/MYLANTA) 200-200-20 MG/5ML suspension 30 mL  30 mL Oral Q4H PRN Briant Cedar, MD   30 mL at 09/27/21 0031   ARIPiprazole (ABILIFY) tablet 30 mg  30 mg Oral Daily Massengill, Ovid Curd, MD   30 mg at 09/30/21 0756   benztropine (COGENTIN) tablet 0.5 mg  0.5 mg Oral BID PRN Armando Reichert, MD       calcium carbonate (TUMS - dosed in mg elemental calcium) chewable tablet 200 mg of elemental calcium  1 tablet Oral BID WC McQuilla, Joyce Gross B, MD   200 mg of elemental calcium at 09/30/21 0756   cloNIDine (CATAPRES) tablet 0.1 mg  0.1 mg Oral Q6H PRN Winfred Leeds, Nadir, MD   0.1 mg at 09/21/21 2245   cyclobenzaprine (FLEXERIL) tablet 5 mg  5 mg Oral Q8H PRN Massengill, Ovid Curd, MD   5 mg at 09/30/21 0757   divalproex (DEPAKOTE) DR tablet 750 mg  750 mg Oral BID Massengill, Ovid Curd, MD   750 mg at 09/30/21 0755   fluconazole (DIFLUCAN) tablet 100 mg  100 mg Oral Daily Armando Reichert, MD   100 mg at 09/30/21 0756   glipiZIDE (GLUCOTROL) tablet 5 mg  5 mg Oral BID AC Damita Dunnings B, MD   5 mg at 09/30/21 9983   haloperidol (HALDOL) tablet 10 mg  10 mg Oral Q2000 Armando Reichert, MD   10 mg at 09/29/21 2033   haloperidol (HALDOL) tablet 10 mg  10 mg Oral Daily Armando Reichert, MD   10 mg at 09/30/21 0756   hydrOXYzine (ATARAX) tablet 25 mg  25 mg  Oral TID PRN Evette Georges, NP   25 mg at 09/30/21 0758   insulin aspart (novoLOG) injection 0-15 Units  0-15 Units Subcutaneous TID WC Briant Cedar, MD   3 Units at 09/30/21 0615   insulin aspart (novoLOG) injection 0-5 Units  0-5 Units Subcutaneous QHS Briant Cedar, MD   4 Units at 09/29/21 2035   insulin aspart (novoLOG) injection 3 Units  3 Units Subcutaneous TID with meals Armando Reichert, MD   3 Units at 09/30/21 0615   lisinopril (ZESTRIL) tablet 5 mg  5 mg Oral Daily Massengill, Nathan, MD   5 mg at 09/30/21 0755   LORazepam (ATIVAN) tablet 0.5 mg  0.5 mg Oral BID Massengill, Ovid Curd, MD   0.5 mg at 09/30/21 0755   magnesium hydroxide (MILK OF MAGNESIA) suspension 30 mL  30 mL Oral Daily PRN Briant Cedar, MD       metFORMIN (GLUCOPHAGE) tablet 500 mg  500 mg Oral BID WC Massengill, Ovid Curd, MD   500 mg at 09/30/21 0756   OLANZapine (ZYPREXA) injection 5 mg  5 mg Intramuscular BID PRN Massengill, Ovid Curd, MD       OLANZapine (ZYPREXA) tablet 5 mg  5 mg Oral BID PRN Janine Limbo, MD   5 mg at 09/27/21 1542  propranolol (INDERAL) tablet 10 mg  10 mg Oral Q12H Massengill, Nathan, MD   10 mg at 09/30/21 0755   temazepam (RESTORIL) capsule 30 mg  30 mg Oral QHS Armando Reichert, MD   30 mg at 09/29/21 2033   traZODone (DESYREL) tablet 50 mg  50 mg Oral QHS PRN Briant Cedar, MD   50 mg at 09/29/21 2033   PTA Medications: Medications Prior to Admission  Medication Sig Dispense Refill Last Dose   acetaminophen (TYLENOL) 500 MG tablet Take 500-1,000 mg by mouth every 6 (six) hours as needed for headache.      albuterol (PROVENTIL HFA;VENTOLIN HFA) 108 (90 Base) MCG/ACT inhaler Inhale 1-2 puffs into the lungs every 4 (four) hours as needed for wheezing or shortness of breath. Reported on 10/11/2015 1 Inhaler 3    atorvastatin (LIPITOR) 20 MG tablet Take 20 mg by mouth daily.      blood glucose meter kit and supplies KIT Dispense based on patient and insurance  preference. Use up to four times daily as directed. (FOR ICD-9 250.00, 250.01). 1 each 0    Cholecalciferol (VITAMIN D-3 PO) Take 1 tablet by mouth daily.      Continuous Blood Gluc Receiver (FREESTYLE LIBRE 14 DAY READER) DEVI 1 Units by Does not apply route as needed. 1 Device 0    Continuous Blood Gluc Sensor (FREESTYLE LIBRE 14 DAY SENSOR) MISC 1 Units by Does not apply route every 14 (fourteen) days. 6 each 3    Cyanocobalamin (VITAMIN B-12 PO) Take 1 tablet by mouth daily.      cyclobenzaprine (FLEXERIL) 10 MG tablet Take 10 mg by mouth 3 (three) times daily as needed for muscle spasms.      DULoxetine (CYMBALTA) 60 MG capsule Take 60 mg by mouth 2 (two) times daily.      glipiZIDE (GLUCOTROL) 5 MG tablet Take 5 mg by mouth 2 (two) times daily. For glucose greater than 150.      Insulin Pen Needle (BD PEN NEEDLE NANO U/F) 32G X 4 MM MISC 1 Package by Does not apply route 2 (two) times daily. 100 each 0    levocetirizine (XYZAL) 5 MG tablet Take 5 mg by mouth daily.      loratadine (CLARITIN) 10 MG tablet Take 10 mg by mouth at bedtime.      MAGNESIUM PO Take 1 tablet by mouth daily.      metFORMIN (GLUCOPHAGE) 500 MG tablet TAKE 1 TABLET(500 MG) BY MOUTH TWICE DAILY WITH A MEAL (Patient taking differently: Take 1,000 mg by mouth 2 (two) times daily with a meal.) 180 tablet 0    naproxen sodium (ALEVE) 220 MG tablet Take 220-440 mg by mouth 2 (two) times daily as needed (For headache).      ONE TOUCH ULTRA TEST test strip USE TO TEST FOUR TIMES DAILY AS DIRECTED 100 each 1    ONETOUCH DELICA LANCETS FINE MISC Use up to four times daily as directed due to hyperglycemia, weight change, medication monitoring. 100 each prn    pioglitazone (ACTOS) 45 MG tablet Take 45 mg by mouth daily.      POTASSIUM PO Take 1 tablet by mouth daily.      TURMERIC PO Take 1 capsule by mouth daily.       Patient Stressors: Health problems   Medication change or noncompliance   Traumatic event    Patient  Strengths: Religious Affiliation  Supportive family/friends   Treatment Modalities: Medication Management, Group therapy, Case  management,  1 to 1 session with clinician, Psychoeducation, Recreational therapy.   Physician Treatment Plan for Primary Diagnosis: Bipolar I disorder, most recent episode (or current) manic ([place]) Long Term Goal(s): Improvement in symptoms so as ready for discharge   Short Term Goals: Ability to identify changes in lifestyle to reduce recurrence of condition will improve Ability to verbalize feelings will improve Ability to demonstrate self-control will improve Ability to identify and develop effective coping behaviors will improve Ability to maintain clinical measurements within normal limits will improve Compliance with prescribed medications will improve Ability to identify triggers associated with substance abuse/mental health issues will improve  Medication Management: Evaluate patient's response, side effects, and tolerance of medication regimen.  Therapeutic Interventions: 1 to 1 sessions, Unit Group sessions and Medication administration.  Evaluation of Outcomes: Progressing  Physician Treatment Plan for Secondary Diagnosis: Principal Problem:   Bipolar I disorder, most recent episode (or current) manic ([place]) Active Problems:   PTSD (post-traumatic stress disorder)   Fibromyalgia  Long Term Goal(s): Improvement in symptoms so as ready for discharge   Short Term Goals: Ability to identify changes in lifestyle to reduce recurrence of condition will improve Ability to verbalize feelings will improve Ability to demonstrate self-control will improve Ability to identify and develop effective coping behaviors will improve Ability to maintain clinical measurements within normal limits will improve Compliance with prescribed medications will improve Ability to identify triggers associated with substance abuse/mental health issues will improve      Medication Management: Evaluate patient's response, side effects, and tolerance of medication regimen.  Therapeutic Interventions: 1 to 1 sessions, Unit Group sessions and Medication administration.  Evaluation of Outcomes: Progressing   RN Treatment Plan for Primary Diagnosis: Bipolar I disorder, most recent episode (or current) manic ([place]) Long Term Goal(s): Knowledge of disease and therapeutic regimen to maintain health will improve  Short Term Goals: Ability to remain free from injury will improve, Ability to participate in decision making will improve, Ability to verbalize feelings will improve, Ability to disclose and discuss suicidal ideas, and Ability to identify and develop effective coping behaviors will improve  Medication Management: RN will administer medications as ordered by provider, will assess and evaluate patient's response and provide education to patient for prescribed medication. RN will report any adverse and/or side effects to prescribing provider.  Therapeutic Interventions: 1 on 1 counseling sessions, Psychoeducation, Medication administration, Evaluate responses to treatment, Monitor vital signs and CBGs as ordered, Perform/monitor CIWA, COWS, AIMS and Fall Risk screenings as ordered, Perform wound care treatments as ordered.  Evaluation of Outcomes: Progressing   LCSW Treatment Plan for Primary Diagnosis: Bipolar I disorder, most recent episode (or current) manic ([place]) Long Term Goal(s): Safe transition to appropriate next level of care at discharge, Engage patient in therapeutic group addressing interpersonal concerns.  Short Term Goals: Engage patient in aftercare planning with referrals and resources, Increase social support, Increase emotional regulation, Facilitate acceptance of mental health diagnosis and concerns, Identify triggers associated with mental health/substance abuse issues, and Increase skills for wellness and recovery  Therapeutic  Interventions: Assess for all discharge needs, 1 to 1 time with Social worker, Explore available resources and support systems, Assess for adequacy in community support network, Educate family and significant other(s) on suicide prevention, Complete Psychosocial Assessment, Interpersonal group therapy.  Evaluation of Outcomes: Progressing   Progress in Treatment: Attending groups: Yes. Participating in groups: Yes. Taking medication as prescribed: Yes. Toleration medication: Yes. Family/Significant other contact made: Yes, individual(s) contacted:  friend, Angie  Patient understands diagnosis: Yes. Discussing patient identified problems/goals with staff: Yes. Medical problems stabilized or resolved: Yes. Denies suicidal/homicidal ideation: Yes. Issues/concerns per patient self-inventory: No.     New problem(s) identified: No, Describe:  none reported   New Short Term/Long Term Goal(s):    medication stabilization, elimination of SI thoughts, development of comprehensive mental wellness plan.      Patient Goals:  Patient continues to want to work toward initial goal of "get meds good and go home"   Discharge Plan or Barriers: Patient recently admitted. CSW will continue to follow and assess for appropriate referrals and possible discharge planning.      Reason for Continuation of Hospitalization: Mania   Estimated Length of Stay: 3-5 days  Last 3 [place] Suicide Severity Risk Score: [place] Admission (Current) from 09/19/2021 in [place] [facility]  C-SSRS RISK CATEGORY No Risk       Last PHQ 2/9 Scores:    11/25/2017    2:15 PM 11/21/2017   11:19 AM 11/08/2017    3:52 PM  Depression screen PHQ 2/9  Decreased Interest 0 0 0  Down, Depressed, Hopeless 0 0 0  PHQ - 2 Score 0 0 0    Scribe for Treatment Team: Darleen Crocker, Latanya Presser 09/30/2021 9:11 AM

## 2021-09-30 NOTE — BHH Counselor (Signed)
CSW spoke with patient about discharge plans.  Patient is worried about affordability of mental health services.  CSW discussed places that they were recommending that would assist with costs. Patient agreeable to try those places.  Patient also open to ACTT referral and understood that ACTT would probably not be able to assess her for another 4 weeks.  Patient understood.  CSW put in referral for ACTT.    Halei Hanover, LCSW, LCAS Clincal Social Worker  [facility]

## 2021-09-30 NOTE — Inpatient Diabetes Management (Signed)
Inpatient Diabetes Program Recommendations  AACE/ADA: New Consensus Statement on Inpatient Glycemic Control (2015)  Target Ranges:  Prepandial:   less than 140 mg/dL      Peak postprandial:   less than 180 mg/dL (1-2 hours)      Critically ill patients:  140 - 180 mg/dL    Latest Reference Range & Units 09/29/21 06:10 09/29/21 11:56 09/29/21 17:20 09/29/21 19:57  Glucose-Capillary 70 - 99 mg/dL 376 (H)  5 units Novolog  277 (H)  11 units Novolog  297 (H)  11 units Novolog  327 (H)  4 units Novolog   (H): Data is abnormally high  Latest Reference Range & Units 09/30/21 06:07 09/30/21 11:43  Glucose-Capillary 70 - 99 mg/dL 283 (H)  6 units Novolog  313 (H)  (H): Data is abnormally high     Home DM Meds: Glipizide 5 mg BID       Metformin 1000 mg BID       Actos 45 mg daily   Current Orders: Glipizide 5 mg BID        Metformin 500 mg BID       Novolog 3 units TID with meals       Novolog Moderate Correction Scale/ SSI (0-15 units) TID AC + HS     MD- Note Novolog 3 units TID for meal coverage started yest 6/20 at 12pm with Lunch  AM CBG remains >200 today--Post-meal CBG also remains elevated  Please consider:  1. Start Semglee 13 units daily (0.1 units/kg)  2. Increase Novolog Meal Coverage to 5 units TID with meals    --Will follow patient during hospitalization--  Ambrose Finland RN, MSN, CDE Diabetes Coordinator Inpatient Glycemic Control Team Team Pager: [phone] (8a-5p)

## 2021-10-01 LAB — GLUCOSE, CAPILLARY
Glucose-Capillary: 251 mg/dL — ABNORMAL HIGH (ref 70–99)
Glucose-Capillary: 344 mg/dL — ABNORMAL HIGH (ref 70–99)
Glucose-Capillary: 305 mg/dL — ABNORMAL HIGH (ref 70–99)
Glucose-Capillary: 345 mg/dL — ABNORMAL HIGH (ref 70–99)

## 2021-10-01 MED ORDER — PROPRANOLOL HCL 10 MG PO TABS
5.0000 mg | ORAL_TABLET | Freq: Two times a day (BID) | ORAL | Status: DC
  Administered 2021-10-01 – 2021-10-02 (×2): 5 mg via ORAL
  Filled 2021-10-01 (×4): qty 0.5

## 2021-10-01 MED ORDER — LISINOPRIL 5 MG PO TABS
2.5000 mg | ORAL_TABLET | Freq: Every day | ORAL | Status: DC
  Administered 2021-10-02 – 2021-10-05 (×4): 2.5 mg via ORAL
  Filled 2021-10-01 (×5): qty 0.5

## 2021-10-01 MED ORDER — TEMAZEPAM 15 MG PO CAPS
15.0000 mg | ORAL_CAPSULE | Freq: Every day | ORAL | Status: DC
  Administered 2021-10-01 – 2021-10-04 (×4): 15 mg via ORAL
  Filled 2021-10-01 (×4): qty 1

## 2021-10-01 MED ORDER — DIAZEPAM 2 MG PO TABS: 2.0000 mg | ORAL_TABLET | Freq: Three times a day (TID) | ORAL | Status: DC

## 2021-10-01 MED ORDER — TEMAZEPAM 15 MG PO CAPS: 15.0000 mg | ORAL_CAPSULE | Freq: Every day | ORAL | Status: DC

## 2021-10-01 MED ORDER — CHEWING GUM (ORBIT) SUGAR FREE
1.0000 | CHEWING_GUM | Freq: Four times a day (QID) | ORAL | Status: DC | PRN
  Administered 2021-10-01: 1 via ORAL

## 2021-10-01 MED ORDER — PIOGLITAZONE HCL 15 MG PO TABS
45.0000 mg | ORAL_TABLET | Freq: Every day | ORAL | Status: DC
  Administered 2021-10-01 – 2021-10-05 (×5): 45 mg via ORAL
  Filled 2021-10-01 (×6): qty 3

## 2021-10-01 MED ORDER — GABAPENTIN 300 MG PO CAPS
300.0000 mg | ORAL_CAPSULE | Freq: Three times a day (TID) | ORAL | Status: DC
  Administered 2021-10-01 – 2021-10-05 (×12): 300 mg via ORAL
  Filled 2021-10-01 (×3): qty 1
  Filled 2021-10-01: qty 21
  Filled 2021-10-01: qty 1
  Filled 2021-10-01: qty 21
  Filled 2021-10-01: qty 1
  Filled 2021-10-01: qty 21
  Filled 2021-10-01 (×3): qty 1
  Filled 2021-10-01: qty 21
  Filled 2021-10-01: qty 1
  Filled 2021-10-01: qty 21
  Filled 2021-10-01: qty 1
  Filled 2021-10-01: qty 21
  Filled 2021-10-01 (×3): qty 1

## 2021-10-01 NOTE — Group Note (Signed)
Date:  10/01/2021 Time:  4:14 PM  Group Topic/Focus:  Coping With Mental Health Crisis:   The purpose of this group is to help patients identify strategies for coping with mental health crisis.  Group discusses possible causes of crisis and ways to manage them effectively. Crisis Planning:   The purpose of this group is to help patients create a crisis plan for use upon discharge or in the future, as needed. Making Healthy Choices:   The focus of this group is to help patients identify negative/unhealthy choices they were using prior to admission and identify positive/healthier coping strategies to replace them upon discharge. Recovery Goals:   The focus of this group is to identify appropriate goals for recovery and establish a plan to achieve them. Relapse Prevention Planning:   The focus of this group is to define relapse and discuss the need for planning to combat relapse. Self Care:   The focus of this group is to help patients understand the importance of self-care in order to improve or restore emotional, physical, spiritual, interpersonal, and financial health.    Participation Level:  Active  Participation Quality:  Appropriate  Affect:  Appropriate  Cognitive:  Appropriate  Insight: Appropriate  Engagement in Group:  Engaged  Modes of Intervention:  Discussion and Education   Ted Mcalpine 10/01/2021, 4:14 PM Kerrin Champagne, OT

## 2021-10-01 NOTE — Progress Notes (Signed)
   10/01/21 2000  Psych Admission Type (Psych Patients Only)  Admission Status Voluntary  Psychosocial Assessment  Patient Complaints Anxiety  Eye Contact Fair  Facial Expression Anxious  Affect Anxious  Speech Rapid;Pressured  Interaction Assertive;Attention-seeking  Motor Activity Fidgety  Appearance/Hygiene Unremarkable  Behavior Characteristics Cooperative  Mood Labile  Aggressive Behavior  Effect No apparent injury  Thought Process  Coherency Disorganized;Flight of ideas  Content Preoccupation;Blaming others  Delusions Religious  Perception Hallucinations  Hallucination Auditory  Judgment Impaired  Confusion None  Danger to Self  Current suicidal ideation? Denies

## 2021-10-01 NOTE — Progress Notes (Signed)
Pt observed with labile mood, agitation, tearful with logical but pressured speech and demanding d/c on initial interactions "I want to leave, I'm tired being here. I want to sleep in my bed, I want to pet my cats. I can't prostitute myself to get my my medications. I'm done with all of this, send me home now". Denies SI, HI, AVH and pain when assessed. Compliant with medications when offered. Reports dry mouth with current regimen, started on orbit gum. Received PRN Tylenol, Flexeril and Vistaril as ordered (See EMAR) this shift with good effect. Attended scheduled groups and was engaged in activities in milieu. Safety checks maintained at Q 15 minutes intervals. Emotional support and reassurance provided to pt throughout this shift. Verbal education provided on all medications and effects monitored. Pt went off unit for meals, returned without issues. Denies concerns at this time.

## 2021-10-01 NOTE — Progress Notes (Signed)
   10/01/21 0515  Sleep  Number of Hours 6.5

## 2021-10-02 LAB — GLUCOSE, CAPILLARY
Glucose-Capillary: 216 mg/dL — ABNORMAL HIGH (ref 70–99)
Glucose-Capillary: 292 mg/dL — ABNORMAL HIGH (ref 70–99)
Glucose-Capillary: 275 mg/dL — ABNORMAL HIGH (ref 70–99)
Glucose-Capillary: 253 mg/dL — ABNORMAL HIGH (ref 70–99)

## 2021-10-02 MED ORDER — INSULIN GLARGINE-YFGN 100 UNIT/ML ~~LOC~~ SOLN
13.0000 [IU] | Freq: Every day | SUBCUTANEOUS | Status: DC
  Administered 2021-10-02 – 2021-10-05 (×4): 13 [IU] via SUBCUTANEOUS

## 2021-10-02 MED ORDER — PROPRANOLOL HCL 10 MG PO TABS
10.0000 mg | ORAL_TABLET | Freq: Two times a day (BID) | ORAL | Status: DC
  Administered 2021-10-02 – 2021-10-05 (×6): 10 mg via ORAL
  Filled 2021-10-02 (×8): qty 1

## 2021-10-02 MED ORDER — INSULIN ASPART 100 UNIT/ML IJ SOLN
5.0000 [IU] | Freq: Three times a day (TID) | INTRAMUSCULAR | Status: DC
  Administered 2021-10-02 – 2021-10-05 (×9): 5 [IU] via SUBCUTANEOUS

## 2021-10-02 MED ORDER — BENZTROPINE MESYLATE 0.5 MG PO TABS
0.5000 mg | ORAL_TABLET | Freq: Every day | ORAL | Status: DC
  Administered 2021-10-03 – 2021-10-05 (×3): 0.5 mg via ORAL
  Filled 2021-10-02 (×4): qty 1

## 2021-10-02 NOTE — Group Note (Signed)
[facility] LCSW Group Therapy Note  Date/Time: 10/02/2021  Type of Therapy and Topic:  Group Therapy:  Strengths and Qualities  Participation Level:  Active  Description of Group:    In this group patients will be asked to explore and define the terms strength ans qualities.  Patients will be guided to discuss their thoughts, feelings, and behaviors as to where strengths and qualities originate. Participants will then list some of their strengths and qualities related to each subject topic. This group will be process-oriented, with patients participating in exploration of their own experiences as well as giving and receiving support and challenge from other group members.  Therapeutic Goals: Patient will identify specific strengths related to their personal life. Patient will identify feelings, thoughts, and beliefs about strengths and qualities. Patient will identify ways their strengths have been used. . Patient will identify situations where they have helped others or made someone else happy. .  Summary of Patient Progress Patient participated in group on today. Patient was appropriate, attentive, and sharing in her experiences. Patient was able to discuss some of her feelings and behaviors related to the obstacles she has faced. Patient participated in discussion around fictional characters and people that she is inspired by and identified strengths of each person.  Patient was then able to identify how those peoples strengths compared to her own strengths.      Therapeutic Modalities:   Cognitive Behavioral Therapy Solution Focused Therapy Motivational Interviewing Brief Therapy   Marquette Piontek, LCSW, LCAS Clincal Social Worker  [facility]

## 2021-10-02 NOTE — BHH Group Notes (Signed)
  Spirituality group facilitated by Kathleen Argue, BCC.   Group Description: Group focused on topic of hope. Patients participated in facilitated discussion around topic, connecting with one another around experiences and definitions for hope. Group members engaged with visual explorer photos, reflecting on what hope looks like for them today. Group engaged in discussion around how their definitions of hope are present today in hospital.   Modalities: Psycho-social ed, Adlerian, Narrative, MI   Patient Progress: Kerri Ford attended group and actively engaged in the conversation.  Her comments were on topic and showed insight.  She has lost 2 cats in recent years and this has been very hard on her. She was emotionally labile and was tearful at two different moments during the conversation.  [address] Bcc Pager, [phone]
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
## 2021-10-03 LAB — GLUCOSE, CAPILLARY
Glucose-Capillary: 221 mg/dL — ABNORMAL HIGH (ref 70–99)
Glucose-Capillary: 224 mg/dL — ABNORMAL HIGH (ref 70–99)
Glucose-Capillary: 243 mg/dL — ABNORMAL HIGH (ref 70–99)
Glucose-Capillary: 262 mg/dL — ABNORMAL HIGH (ref 70–99)

## 2021-10-03 MED ORDER — TRAZODONE HCL 100 MG PO TABS: 100.0000 mg | ORAL_TABLET | Freq: Every evening | ORAL | Status: DC | PRN

## 2021-10-03 MED ORDER — LOPERAMIDE HCL 2 MG PO CAPS
2.0000 mg | ORAL_CAPSULE | ORAL | Status: DC | PRN
  Administered 2021-10-03: 2 mg via ORAL
  Filled 2021-10-03: qty 1
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
## 2021-10-03 NOTE — BHH Group Notes (Signed)
.  Psychoeducational Group Note  Date 10/03/2020 Time: 0900-1000    Goal Setting   Purpose of Group: Group Focus: affirmation, clarity of thought, and goals/reality orientation Treatment Modality:  Psychoeducation Interventions utilized were assignment, group exercise, and support  Purpose: To be able to understand and verbalize the reason for their admission to the hospital. To understand that the medication helps with their chemical imbalance but they also need to work on their choices in life. To be challenged to develop a list of 30 positives about themselves. Also introduce the concept that "feelings" are not reality.    Participation Level:  did not attend  Dione Housekeeper
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
## 2021-10-04 LAB — GLUCOSE, CAPILLARY
Glucose-Capillary: 182 mg/dL — ABNORMAL HIGH (ref 70–99)
Glucose-Capillary: 305 mg/dL — ABNORMAL HIGH (ref 70–99)
Glucose-Capillary: 268 mg/dL — ABNORMAL HIGH (ref 70–99)

## 2021-10-04 MED ORDER — TRAZODONE HCL 50 MG PO TABS: 50.0000 mg | ORAL_TABLET | Freq: Every evening | ORAL | Status: DC | PRN

## 2021-10-04 MED ORDER — HALOPERIDOL DECANOATE 100 MG/ML IM SOLN
100.0000 mg | Freq: Once | INTRAMUSCULAR | Status: AC
  Administered 2021-10-04: 100 mg via INTRAMUSCULAR
  Filled 2021-10-04: qty 1

## 2021-10-04 NOTE — Progress Notes (Signed)
Patient  had a bowel movement that was marched potato consistency. Per patient, stool was not loose. Staff will continue to monitor.

## 2021-10-04 NOTE — Plan of Care (Signed)
  Problem: Coping: Goal: Ability to adjust to condition or change in health will improve Outcome: Progressing   

## 2021-10-05 ENCOUNTER — Encounter (HOSPITAL_COMMUNITY): Payer: Self-pay

## 2021-10-05 LAB — GLUCOSE, CAPILLARY: Glucose-Capillary: 171 mg/dL — ABNORMAL HIGH (ref 70–99)

## 2021-10-05 MED ORDER — BLOOD GLUCOSE METER KIT
PACK | 0 refills | Status: DC
Start: 2021-10-05 — End: 2023-03-16

## 2021-10-05 MED ORDER — FLUCONAZOLE 100 MG PO TABS
100.0000 mg | ORAL_TABLET | Freq: Every day | ORAL | 0 refills | Status: AC
Start: 2021-10-06 — End: 2021-10-20

## 2021-10-05 MED ORDER — TEMAZEPAM 15 MG PO CAPS: 15.0000 mg | ORAL_CAPSULE | Freq: Every day | ORAL | 0 refills | Status: DC

## 2021-10-05 MED ORDER — PROPRANOLOL HCL 10 MG PO TABS
10.0000 mg | ORAL_TABLET | Freq: Two times a day (BID) | ORAL | 0 refills | Status: DC
Start: 2021-10-05 — End: 2021-10-19

## 2021-10-05 MED ORDER — INSULIN ASPART 100 UNIT/ML FLEXPEN: 5.0000 [IU] | PEN_INJECTOR | Freq: Three times a day (TID) | SUBCUTANEOUS | 0 refills | Status: DC

## 2021-10-05 MED ORDER — METFORMIN HCL 500 MG PO TABS: 500.0000 mg | ORAL_TABLET | Freq: Two times a day (BID) | ORAL | 0 refills | Status: DC

## 2021-10-05 MED ORDER — HALOPERIDOL 10 MG PO TABS: 10.0000 mg | ORAL_TABLET | Freq: Two times a day (BID) | ORAL | 0 refills | Status: DC

## 2021-10-05 MED ORDER — GABAPENTIN 300 MG PO CAPS
300.0000 mg | ORAL_CAPSULE | Freq: Three times a day (TID) | ORAL | 0 refills | Status: DC
Start: 2021-10-05 — End: 2023-03-16

## 2021-10-05 MED ORDER — INSULIN GLARGINE-YFGN 100 UNIT/ML ~~LOC~~ SOPN
13.0000 [IU] | PEN_INJECTOR | Freq: Every day | SUBCUTANEOUS | 0 refills | Status: DC
Start: 2021-10-05 — End: 2023-03-16

## 2021-10-05 MED ORDER — HYDROXYZINE HCL 25 MG PO TABS: 25.0000 mg | ORAL_TABLET | Freq: Three times a day (TID) | ORAL | 0 refills | Status: AC | PRN

## 2021-10-05 MED ORDER — BENZTROPINE MESYLATE 0.5 MG PO TABS: 0.5000 mg | ORAL_TABLET | Freq: Every day | ORAL | 0 refills | Status: DC

## 2021-10-05 MED ORDER — LISINOPRIL 2.5 MG PO TABS
2.5000 mg | ORAL_TABLET | Freq: Every day | ORAL | 0 refills | Status: DC
Start: 2021-10-06 — End: 2023-03-16

## 2021-10-05 MED ORDER — ARIPIPRAZOLE 30 MG PO TABS: 30.0000 mg | ORAL_TABLET | Freq: Every day | ORAL | 0 refills | Status: DC

## 2021-10-05 MED ORDER — CYCLOBENZAPRINE HCL 5 MG PO TABS: 5.0000 mg | ORAL_TABLET | Freq: Three times a day (TID) | ORAL | 0 refills | Status: AC | PRN

## 2021-10-05 MED ORDER — DIVALPROEX SODIUM 250 MG PO DR TAB: 750.0000 mg | DELAYED_RELEASE_TABLET | Freq: Two times a day (BID) | ORAL | 0 refills | Status: DC

## 2021-10-05 MED ORDER — GLIPIZIDE 5 MG PO TABS: 5.0000 mg | ORAL_TABLET | Freq: Two times a day (BID) | ORAL | 0 refills | Status: AC

## 2021-10-05 MED ORDER — TRAZODONE HCL 50 MG PO TABS: 50.0000 mg | ORAL_TABLET | Freq: Every evening | ORAL | 0 refills | Status: DC | PRN

## 2021-10-05 MED ORDER — PIOGLITAZONE HCL 45 MG PO TABS: 45.0000 mg | ORAL_TABLET | Freq: Every day | ORAL | 0 refills | Status: AC

## 2021-10-05 NOTE — Progress Notes (Signed)
Patient discharged. Discharge instructions reviewed with patient. Pt verbalized understanding. Patient received all personal belongings. Pt received sample medications and paper prescriptions. Pt at discharge denies SI/HI. Pt left unit at 1140.

## 2021-10-10 NOTE — Group Note (Signed)
LCSW Group Therapy Note   Group Date: 10/04/2021 Start Time: 1115 End Time: 1200   On isolation, did not attend.  Lynnell Chad, LCSWA 10/10/2021  11:13 AM

## 2021-10-12 ENCOUNTER — Telehealth (HOSPITAL_COMMUNITY): Payer: Self-pay | Admitting: Family Medicine

## 2021-10-12 NOTE — BH Assessment (Signed)
Care Management - Follow Up [facility] Discharges   Patient has been placed in an inpatient psychiatric hospital [facility]) on   09-19-21

## 2021-10-19 ENCOUNTER — Other Ambulatory Visit (HOSPITAL_BASED_OUTPATIENT_CLINIC_OR_DEPARTMENT_OTHER): Payer: Self-pay

## 2021-10-19 ENCOUNTER — Ambulatory Visit (INDEPENDENT_AMBULATORY_CARE_PROVIDER_SITE_OTHER): Payer: No Payment, Other | Admitting: Student in an Organized Health Care Education/Training Program

## 2021-10-19 ENCOUNTER — Encounter (HOSPITAL_COMMUNITY): Payer: Self-pay | Admitting: Student in an Organized Health Care Education/Training Program

## 2021-10-19 VITALS — BP 104/51 | HR 95 | Wt 306.0 lb

## 2021-10-19 DIAGNOSIS — Z8659 Personal history of other mental and behavioral disorders: Secondary | ICD-10-CM | POA: Diagnosis not present

## 2021-10-19 DIAGNOSIS — F3110 Bipolar disorder, current episode manic without psychotic features, unspecified: Secondary | ICD-10-CM

## 2021-10-19 MED ORDER — DIVALPROEX SODIUM 250 MG PO DR TAB
750.0000 mg | DELAYED_RELEASE_TABLET | Freq: Two times a day (BID) | ORAL | 2 refills | Status: DC
  Filled 2021-10-19: qty 180, 30d supply, fill #0

## 2021-10-19 MED ORDER — HALOPERIDOL DECANOATE 100 MG/ML IM SOLN
100.0000 mg | INTRAMUSCULAR | 2 refills | Status: DC
  Filled 2021-10-19: qty 1, 28d supply, fill #0

## 2021-10-19 MED ORDER — ARIPIPRAZOLE 30 MG PO TABS
30.0000 mg | ORAL_TABLET | Freq: Every day | ORAL | 0 refills | Status: DC
  Filled 2021-10-19 – 2021-10-29 (×3): qty 30, 30d supply, fill #0

## 2021-10-19 MED ORDER — DIVALPROEX SODIUM 250 MG PO DR TAB: 750.0000 mg | DELAYED_RELEASE_TABLET | Freq: Two times a day (BID) | ORAL | 2 refills | Status: DC

## 2021-10-19 MED ORDER — BENZTROPINE MESYLATE 0.5 MG PO TABS
0.5000 mg | ORAL_TABLET | Freq: Every day | ORAL | 0 refills | Status: DC
  Filled 2021-10-19 – 2021-10-29 (×3): qty 30, 30d supply, fill #0

## 2021-10-19 NOTE — Addendum Note (Signed)
Addended by: Eliseo Gum B on: 10/19/2021 11:35 AM   Modules accepted: Orders
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
## 2021-10-20 ENCOUNTER — Other Ambulatory Visit (HOSPITAL_BASED_OUTPATIENT_CLINIC_OR_DEPARTMENT_OTHER): Payer: Self-pay

## 2021-10-29 ENCOUNTER — Other Ambulatory Visit: Payer: Self-pay

## 2021-10-29 ENCOUNTER — Other Ambulatory Visit (HOSPITAL_BASED_OUTPATIENT_CLINIC_OR_DEPARTMENT_OTHER): Payer: Self-pay

## 2021-10-29 MED ORDER — HALOPERIDOL DECANOATE 50 MG/ML IM SOLN
INTRAMUSCULAR | 2 refills | Status: DC
  Filled 2021-10-29: qty 2, 28d supply, fill #0

## 2021-10-30 ENCOUNTER — Other Ambulatory Visit: Payer: Self-pay

## 2021-11-02 ENCOUNTER — Other Ambulatory Visit: Payer: Self-pay

## 2021-11-03 ENCOUNTER — Encounter (HOSPITAL_COMMUNITY): Payer: Self-pay

## 2021-11-03 ENCOUNTER — Ambulatory Visit (INDEPENDENT_AMBULATORY_CARE_PROVIDER_SITE_OTHER): Payer: No Payment, Other

## 2021-11-03 VITALS — BP 116/66 | HR 92 | Ht 64.0 in | Wt 298.0 lb

## 2021-11-03 DIAGNOSIS — F3110 Bipolar disorder, current episode manic without psychotic features, unspecified: Secondary | ICD-10-CM

## 2021-11-03 MED ORDER — HALOPERIDOL DECANOATE 100 MG/ML IM SOLN
100.0000 mg | Freq: Once | INTRAMUSCULAR | Status: AC
  Administered 2021-11-03: 100 mg via INTRAMUSCULAR

## 2021-11-11 ENCOUNTER — Other Ambulatory Visit (HOSPITAL_COMMUNITY): Payer: Self-pay | Admitting: Student in an Organized Health Care Education/Training Program

## 2021-11-11 DIAGNOSIS — F3110 Bipolar disorder, current episode manic without psychotic features, unspecified: Secondary | ICD-10-CM

## 2021-11-11 MED ORDER — ARIPIPRAZOLE 30 MG PO TABS: 30.0000 mg | ORAL_TABLET | Freq: Every day | ORAL | 2 refills | Status: DC

## 2021-11-17 ENCOUNTER — Encounter (HOSPITAL_COMMUNITY): Payer: Self-pay | Admitting: Student in an Organized Health Care Education/Training Program

## 2021-11-17 ENCOUNTER — Other Ambulatory Visit (HOSPITAL_BASED_OUTPATIENT_CLINIC_OR_DEPARTMENT_OTHER): Payer: Self-pay

## 2021-11-17 ENCOUNTER — Ambulatory Visit (INDEPENDENT_AMBULATORY_CARE_PROVIDER_SITE_OTHER): Payer: No Payment, Other | Admitting: Student in an Organized Health Care Education/Training Program

## 2021-11-17 VITALS — BP 126/69 | HR 96 | Resp 24 | Wt 286.6 lb

## 2021-11-17 DIAGNOSIS — F3110 Bipolar disorder, current episode manic without psychotic features, unspecified: Secondary | ICD-10-CM

## 2021-11-17 MED ORDER — DIVALPROEX SODIUM ER 250 MG PO TB24
750.0000 mg | ORAL_TABLET | Freq: Every day | ORAL | 2 refills | Status: DC
  Filled 2021-11-17: qty 90, 30d supply, fill #0

## 2021-11-17 MED ORDER — ARIPIPRAZOLE 15 MG PO TABS
15.0000 mg | ORAL_TABLET | Freq: Every day | ORAL | 2 refills | Status: DC
  Filled 2021-11-17: qty 30, 30d supply, fill #0

## 2021-11-17 NOTE — Patient Instructions (Signed)
You have had multiple medication changes.  Your Abilify has been decreased to 15mg . Patient indicates that she will cut her current medications in half if they are scored, for cost benefit.  Your Depakote has been decreased AND changed to Depakote ER 750mg  nightly. For cost effectiveness patient will take Depakote 250mg  DR in the AM and Depakote DR 500mg  in the PM, as these pills were just purchased by patient. However, there is a prescription of the Depakote 750mg  ER nightly waiting at pharmacy.   Patient's Haldol decanoate injection has been discontinued.  Patient's Cogentin 0.5mg  daily has been discontinued.    Behavioral Health Crisis:  [facility]): [address] [place], [facility]. This facility is open for urgent mental health crisis as well as outpatient care.
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
## 2021-12-01 ENCOUNTER — Ambulatory Visit (HOSPITAL_COMMUNITY): Payer: No Payment, Other

## 2021-12-22 ENCOUNTER — Encounter (HOSPITAL_COMMUNITY): Payer: No Payment, Other | Admitting: Student in an Organized Health Care Education/Training Program

## 2022-02-12 ENCOUNTER — Encounter (HOSPITAL_COMMUNITY): Payer: Self-pay | Admitting: Student in an Organized Health Care Education/Training Program

## 2022-02-12 ENCOUNTER — Ambulatory Visit (INDEPENDENT_AMBULATORY_CARE_PROVIDER_SITE_OTHER): Payer: No Payment, Other | Admitting: Student in an Organized Health Care Education/Training Program

## 2022-02-12 VITALS — BP 134/79 | HR 97 | Resp 12 | Wt 288.0 lb

## 2022-02-12 DIAGNOSIS — F314 Bipolar disorder, current episode depressed, severe, without psychotic features: Secondary | ICD-10-CM

## 2022-02-12 MED ORDER — DIVALPROEX SODIUM ER 500 MG PO TB24: 500.0000 mg | ORAL_TABLET | Freq: Every day | ORAL | 2 refills | Status: DC
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
## 2022-02-12 NOTE — Patient Instructions (Signed)
   [address], [place] [address] Phone number: [phone], fax: [phone] Monday-Friday: 7 AM-5 PM Drug screen: 8 AM-4 PM  [facility] [address], [place],  [address] Phone number: [phone], fax: [phone] Monday-Friday: 8 AM-5 PM Closed for lunch: 12 PM-1 PM Saturday: 8 AM-12 PM Drug screen: 9 AM-11:30 AM 1 PM-4 PM

## 2022-02-18 ENCOUNTER — Ambulatory Visit (INDEPENDENT_AMBULATORY_CARE_PROVIDER_SITE_OTHER): Payer: No Payment, Other | Admitting: Licensed Clinical Social Worker

## 2022-02-18 DIAGNOSIS — F3110 Bipolar disorder, current episode manic without psychotic features, unspecified: Secondary | ICD-10-CM
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
## 2022-02-26 ENCOUNTER — Ambulatory Visit (INDEPENDENT_AMBULATORY_CARE_PROVIDER_SITE_OTHER): Payer: No Payment, Other | Admitting: Student in an Organized Health Care Education/Training Program

## 2022-02-26 ENCOUNTER — Encounter (HOSPITAL_COMMUNITY): Payer: Self-pay | Admitting: Student in an Organized Health Care Education/Training Program

## 2022-02-26 DIAGNOSIS — F314 Bipolar disorder, current episode depressed, severe, without psychotic features: Secondary | ICD-10-CM | POA: Diagnosis not present

## 2022-02-26 MED ORDER — DIVALPROEX SODIUM ER 500 MG PO TB24: 500.0000 mg | ORAL_TABLET | Freq: Every day | ORAL | 2 refills | Status: DC
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
## 2022-02-26 NOTE — Patient Instructions (Signed)
  [facility] [address] [place], [place] [address] Phone number: [phone], fax: [phone] Monday-Friday: 8 AM-5 PM Closed for lunch: 12 PM-1 PM Saturday: 8 AM-12 PM Drug screen: 9 AM-11:30 AM 1 PM-4 PM

## 2022-03-19 ENCOUNTER — Other Ambulatory Visit (HOSPITAL_COMMUNITY): Payer: 59

## 2022-03-19 ENCOUNTER — Ambulatory Visit (INDEPENDENT_AMBULATORY_CARE_PROVIDER_SITE_OTHER): Payer: No Payment, Other | Admitting: Student in an Organized Health Care Education/Training Program

## 2022-03-19 ENCOUNTER — Encounter (HOSPITAL_COMMUNITY): Payer: Self-pay | Admitting: Student in an Organized Health Care Education/Training Program

## 2022-03-19 ENCOUNTER — Encounter (HOSPITAL_COMMUNITY): Payer: Self-pay

## 2022-03-19 VITALS — BP 128/83 | HR 85 | Resp 20 | Wt 293.0 lb

## 2022-03-19 DIAGNOSIS — F314 Bipolar disorder, current episode depressed, severe, without psychotic features: Secondary | ICD-10-CM

## 2022-03-19 DIAGNOSIS — F3110 Bipolar disorder, current episode manic without psychotic features, unspecified: Secondary | ICD-10-CM

## 2022-03-19 MED ORDER — DIVALPROEX SODIUM ER 500 MG PO TB24
1000.0000 mg | ORAL_TABLET | Freq: Every day | ORAL | 2 refills | Status: DC
Start: 2022-03-19 — End: 2022-04-23

## 2022-03-19 NOTE — Progress Notes (Deleted)
LABS DRAWN @ [facility] ORDERS FAXED 03/19/22 DMc

## 2022-03-19 NOTE — Patient Instructions (Signed)
[facility]: [address], [place], [place] [address]
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
## 2022-04-21 ENCOUNTER — Ambulatory Visit (INDEPENDENT_AMBULATORY_CARE_PROVIDER_SITE_OTHER): Payer: No Payment, Other | Admitting: Licensed Clinical Social Worker

## 2022-04-21 ENCOUNTER — Encounter (HOSPITAL_COMMUNITY): Payer: Self-pay

## 2022-04-21 DIAGNOSIS — F3110 Bipolar disorder, current episode manic without psychotic features, unspecified: Secondary | ICD-10-CM

## 2022-04-21 DIAGNOSIS — F332 Major depressive disorder, recurrent severe without psychotic features: Secondary | ICD-10-CM

## 2022-04-21 DIAGNOSIS — F4310 Post-traumatic stress disorder, unspecified: Secondary | ICD-10-CM
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
## 2022-04-23 ENCOUNTER — Encounter (HOSPITAL_COMMUNITY): Payer: Self-pay | Admitting: Student in an Organized Health Care Education/Training Program

## 2022-04-23 ENCOUNTER — Telehealth (INDEPENDENT_AMBULATORY_CARE_PROVIDER_SITE_OTHER): Payer: No Payment, Other | Admitting: Student in an Organized Health Care Education/Training Program

## 2022-04-23 DIAGNOSIS — F314 Bipolar disorder, current episode depressed, severe, without psychotic features: Secondary | ICD-10-CM | POA: Diagnosis not present

## 2022-04-23 DIAGNOSIS — F3110 Bipolar disorder, current episode manic without psychotic features, unspecified: Secondary | ICD-10-CM

## 2022-04-23 MED ORDER — DIVALPROEX SODIUM ER 500 MG PO TB24
1000.0000 mg | ORAL_TABLET | Freq: Every day | ORAL | 3 refills | Status: DC
Start: 2022-04-23 — End: 2022-07-26
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
## 2022-06-17 ENCOUNTER — Ambulatory Visit (INDEPENDENT_AMBULATORY_CARE_PROVIDER_SITE_OTHER): Payer: No Payment, Other | Admitting: Licensed Clinical Social Worker

## 2022-06-17 DIAGNOSIS — F3110 Bipolar disorder, current episode manic without psychotic features, unspecified: Secondary | ICD-10-CM

## 2022-06-17 DIAGNOSIS — F4310 Post-traumatic stress disorder, unspecified: Secondary | ICD-10-CM
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
## 2022-07-02 ENCOUNTER — Ambulatory Visit (INDEPENDENT_AMBULATORY_CARE_PROVIDER_SITE_OTHER): Payer: No Payment, Other | Admitting: Licensed Clinical Social Worker

## 2022-07-02 DIAGNOSIS — F3110 Bipolar disorder, current episode manic without psychotic features, unspecified: Secondary | ICD-10-CM | POA: Diagnosis not present
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
## 2022-07-22 ENCOUNTER — Ambulatory Visit (HOSPITAL_COMMUNITY): Payer: No Payment, Other | Admitting: Licensed Clinical Social Worker

## 2022-07-22 DIAGNOSIS — F3110 Bipolar disorder, current episode manic without psychotic features, unspecified: Secondary | ICD-10-CM | POA: Diagnosis not present

## 2022-07-22 DIAGNOSIS — F4310 Post-traumatic stress disorder, unspecified: Secondary | ICD-10-CM

## 2022-07-22 DIAGNOSIS — F332 Major depressive disorder, recurrent severe without psychotic features: Secondary | ICD-10-CM

## 2022-07-22 NOTE — Progress Notes (Signed)
THERAPIST PROGRESS NOTE  Virtual Visit via Video Note  I connected with Kerri Ford on 07/22/22 at  4:00 PM EDT by a video enabled telemedicine application and verified that I am speaking with the correct person using two identifiers.  Location: Patient: [facility]  Provider: Providers Home    I discussed the limitations of evaluation and management by telemedicine and the availability of in person appointments. The patient expressed understanding and agreed to proceed.      I discussed the assessment and treatment plan with the patient. The patient was provided an opportunity to ask questions and all were answered. The patient agreed with the plan and demonstrated an understanding of the instructions.   The patient was advised to call back or seek an in-person evaluation if the symptoms worsen or if the condition fails to improve as anticipated.  I provided 30 minutes of non-face-to-face time during this encounter.   Weber Cooks, LCSW   Participation Level: Active  Behavioral Response: CasualAlertAnxious and Depressed  Type of Therapy: Individual Therapy  Treatment Goals addressed:  Active     BH CCP BIPOLAR DISORDER-MANIA/HYPOMANIA     LTG: Kerri Ford will stabilize mood and increase goal-directed behavior as measured by decrease GAD-7 below 5  (Not Progressing)     Start:  04/21/22    Expected End:  12/10/22         STG: Kerri Ford will cooperate with a psychiatric evaluation on 7/10/23and during all follow-up visits (Completed/Met)     Start:  04/21/22    Expected End:  12/10/22    Resolved:  06/17/22      STG: Kerri Ford will complete at least 80% of assigned homework  (Progressing)     Start:  04/21/22    Expected End:  12/10/22         Decrease PHQ-9 below 10  (Progressing)     Start:  04/21/22    Expected End:  12/10/22         Identify three trigger for depression (Progressing)     Start:  04/21/22    Expected End:  12/10/22               ProgressTowards Goals: Progressing  Interventions: CBT and Motivational Interviewing   Suicidal/Homicidal: Nowithout intent/plan  Therapist Response:   Pt was alert and oriented x 5. Kerri Ford presented with depressed and anxious mood/affect. She was pleasant, cooperative and maintained good eye contact. She engaged well in therapy session and was dressed casually.  Pt reports things have been a little better for her as far as her depression and anxiety goes. Kerri Ford states she is focusing on what she can vs what she cannot control. This has helped decreased her depression and anxiety.  Kerri Ford reports stress due to housing. Her lease is up in 60 days, and they have not been offered a renewal yet. She reports worry and tension and she just declared bankruptcy and believes this maybe why her and her roommate have not been offered a renewal. Pt knows if they run another background check, they could see the bankruptcy on her credit score. Kerri Ford reports that she has also been stress with attempting to help her friend navigate resources as her friend has low level of cognitive functioning.  Intervention/Plan: LCSW used psychoanalytic therapy for pt to express thoughts, feeling and emotions. LCSW used supportive therapy for raise and encouragement. LCSW used person centered therapy for empowerment and unconditional positive regard for nonjudgmental stance. Pt to continue to focus  on things she cannot control vs what she cannot control. Pt to provide friend resources for [facility] that LCSW provided.     Plan: Return again in 3 weeks.  Diagnosis: Bipolar I disorder, most recent episode (or current) manic  PTSD (post-traumatic stress disorder)  Severe episode of recurrent major depressive disorder, without psychotic features  Collaboration of Care: Other None today   Patient/Guardian was advised Release of Information must be obtained prior to any record release in order to collaborate their  care with an outside provider. Patient/Guardian was advised if they have not already done so to contact the registration department to sign all necessary forms in order for [name] to release information regarding their care.   Consent: Patient/Guardian gives verbal consent for treatment and assignment of benefits for services provided during this visit. Patient/Guardian expressed understanding and agreed to proceed.   Weber Cooks, LCSW 07/22/2022

## 2022-07-26 ENCOUNTER — Telehealth (INDEPENDENT_AMBULATORY_CARE_PROVIDER_SITE_OTHER): Payer: No Payment, Other | Admitting: Student in an Organized Health Care Education/Training Program

## 2022-07-26 DIAGNOSIS — F314 Bipolar disorder, current episode depressed, severe, without psychotic features: Secondary | ICD-10-CM | POA: Diagnosis not present

## 2022-07-26 DIAGNOSIS — F3110 Bipolar disorder, current episode manic without psychotic features, unspecified: Secondary | ICD-10-CM | POA: Diagnosis not present

## 2022-07-26 DIAGNOSIS — Z5181 Encounter for therapeutic drug level monitoring: Secondary | ICD-10-CM | POA: Diagnosis not present

## 2022-07-26 MED ORDER — DIVALPROEX SODIUM ER 500 MG PO TB24: 1000.0000 mg | ORAL_TABLET | Freq: Every day | ORAL | 1 refills | Status: DC
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
## 2022-07-27 NOTE — Addendum Note (Signed)
Addended by: Theodoro Kos A on: 07/27/2022 09:41 AM   Modules accepted: Orders

## 2022-08-13 ENCOUNTER — Ambulatory Visit (HOSPITAL_COMMUNITY): Payer: No Payment, Other | Admitting: Licensed Clinical Social Worker

## 2022-08-31 ENCOUNTER — Ambulatory Visit (HOSPITAL_COMMUNITY): Payer: No Payment, Other | Admitting: Licensed Clinical Social Worker

## 2022-09-20 ENCOUNTER — Other Ambulatory Visit (HOSPITAL_COMMUNITY): Payer: No Payment, Other

## 2022-09-27 ENCOUNTER — Telehealth (HOSPITAL_COMMUNITY): Payer: No Payment, Other | Admitting: Student in an Organized Health Care Education/Training Program

## 2023-03-15 ENCOUNTER — Observation Stay (HOSPITAL_COMMUNITY)
Admission: EM | Admit: 2023-03-15 | Discharge: 2023-03-17 | Disposition: A | Discharge status: Home / Self Care | Payer: 59 | Attending: Internal Medicine | Admitting: Internal Medicine

## 2023-03-15 ENCOUNTER — Encounter (HOSPITAL_COMMUNITY): Payer: Self-pay

## 2023-03-15 ENCOUNTER — Emergency Department (HOSPITAL_COMMUNITY): Payer: 59

## 2023-03-15 ENCOUNTER — Other Ambulatory Visit: Payer: Self-pay

## 2023-03-15 DIAGNOSIS — Z79899 Other long term (current) drug therapy: Secondary | ICD-10-CM | POA: Diagnosis not present

## 2023-03-15 DIAGNOSIS — Z7984 Long term (current) use of oral hypoglycemic drugs: Secondary | ICD-10-CM | POA: Diagnosis not present

## 2023-03-15 DIAGNOSIS — F319 Bipolar disorder, unspecified: Secondary | ICD-10-CM | POA: Insufficient documentation

## 2023-03-15 DIAGNOSIS — M51369 Other intervertebral disc degeneration, lumbar region without mention of lumbar back pain or lower extremity pain: Secondary | ICD-10-CM | POA: Diagnosis present

## 2023-03-15 DIAGNOSIS — M5136 Other intervertebral disc degeneration, lumbar region with discogenic back pain only: Secondary | ICD-10-CM

## 2023-03-15 DIAGNOSIS — Z794 Long term (current) use of insulin: Secondary | ICD-10-CM | POA: Diagnosis not present

## 2023-03-15 DIAGNOSIS — M5441 Lumbago with sciatica, right side: Principal | ICD-10-CM

## 2023-03-15 DIAGNOSIS — Z6841 Body Mass Index (BMI) 40.0 and over, adult: Secondary | ICD-10-CM | POA: Diagnosis not present

## 2023-03-15 DIAGNOSIS — M549 Dorsalgia, unspecified: Secondary | ICD-10-CM | POA: Diagnosis present

## 2023-03-15 DIAGNOSIS — Z91040 Latex allergy status: Secondary | ICD-10-CM | POA: Insufficient documentation

## 2023-03-15 DIAGNOSIS — E782 Mixed hyperlipidemia: Secondary | ICD-10-CM | POA: Diagnosis present

## 2023-03-15 DIAGNOSIS — M545 Low back pain, unspecified: Secondary | ICD-10-CM | POA: Diagnosis present

## 2023-03-15 DIAGNOSIS — Z8639 Personal history of other endocrine, nutritional and metabolic disease: Secondary | ICD-10-CM

## 2023-03-15 DIAGNOSIS — E785 Hyperlipidemia, unspecified: Secondary | ICD-10-CM | POA: Diagnosis not present

## 2023-03-15 DIAGNOSIS — E119 Type 2 diabetes mellitus without complications: Secondary | ICD-10-CM

## 2023-03-15 DIAGNOSIS — E1165 Type 2 diabetes mellitus with hyperglycemia: Secondary | ICD-10-CM | POA: Diagnosis not present

## 2023-03-15 DIAGNOSIS — E1169 Type 2 diabetes mellitus with other specified complication: Secondary | ICD-10-CM | POA: Diagnosis present

## 2023-03-15 DIAGNOSIS — G4733 Obstructive sleep apnea (adult) (pediatric): Secondary | ICD-10-CM | POA: Diagnosis not present

## 2023-03-15 LAB — COMPREHENSIVE METABOLIC PANEL
ALT: 32 U/L (ref 0–44)
AST: 22 U/L (ref 15–41)
Albumin: 3.9 g/dL (ref 3.5–5.0)
Alkaline Phosphatase: 55 U/L (ref 38–126)
Anion gap: 12 (ref 5–15)
BUN: 11 mg/dL (ref 6–20)
CO2: 24 mmol/L (ref 22–32)
Calcium: 9.7 mg/dL (ref 8.9–10.3)
Chloride: 100 mmol/L (ref 98–111)
Creatinine, Ser: 0.92 mg/dL (ref 0.44–1.00)
GFR, Estimated: >60 mL/min (ref >60)
Glucose, Bld: 210 mg/dL — ABNORMAL HIGH (ref 70–99)
Potassium: 4.1 mmol/L (ref 3.5–5.1)
Sodium: 136 mmol/L (ref 135–145)
Total Bilirubin: 0.5 mg/dL (ref <1.2)
Total Protein: 7.2 g/dL (ref 6.5–8.1)

## 2023-03-15 LAB — CBC WITH DIFFERENTIAL/PLATELET
Abs Immature Granulocytes: 0.04 10*3/uL (ref 0.00–0.07)
Basophils Absolute: 0 10*3/uL (ref 0.0–0.1)
Basophils Relative: 0 %
Eosinophils Absolute: 0.1 10*3/uL (ref 0.0–0.5)
Eosinophils Relative: 1 %
HCT: 40.6 % (ref 36.0–46.0)
Hemoglobin: 13.1 g/dL (ref 12.0–15.0)
Immature Granulocytes: 0 %
Lymphocytes Relative: 14 %
Lymphs Abs: 1.3 10*3/uL (ref 0.7–4.0)
MCH: 28.9 pg (ref 26.0–34.0)
MCHC: 32.3 g/dL (ref 30.0–36.0)
MCV: 89.6 fL (ref 80.0–100.0)
Monocytes Absolute: 0.5 10*3/uL (ref 0.1–1.0)
Monocytes Relative: 5 %
Neutro Abs: 7.7 10*3/uL (ref 1.7–7.7)
Neutrophils Relative %: 80 %
Platelets: 332 10*3/uL (ref 150–400)
RBC: 4.53 MIL/uL (ref 3.87–5.11)
RDW: 12.9 % (ref 11.5–15.5)
WBC: 9.7 10*3/uL (ref 4.0–10.5)
nRBC: 0 % (ref 0.0–0.2)

## 2023-03-15 LAB — HCG, SERUM, QUALITATIVE: Preg, Serum: NEGATIVE

## 2023-03-15 LAB — CBG MONITORING, ED: Glucose-Capillary: 113 mg/dL — ABNORMAL HIGH (ref 70–99)

## 2023-03-15 MED ORDER — MELATONIN 5 MG PO TABS: 5.0000 mg | ORAL_TABLET | Freq: Every evening | ORAL | Status: DC | PRN

## 2023-03-15 MED ORDER — ACETAMINOPHEN 325 MG PO TABS
650.0000 mg | ORAL_TABLET | Freq: Four times a day (QID) | ORAL | Status: DC | PRN
  Administered 2023-03-17: 650 mg via ORAL
  Filled 2023-03-15: qty 2

## 2023-03-15 MED ORDER — OXYCODONE HCL 5 MG PO TABS
5.0000 mg | ORAL_TABLET | Freq: Four times a day (QID) | ORAL | Status: DC | PRN
  Administered 2023-03-16 (×2): 5 mg via ORAL
  Filled 2023-03-15 (×2): qty 1

## 2023-03-15 MED ORDER — KETOROLAC TROMETHAMINE 15 MG/ML IJ SOLN
15.0000 mg | Freq: Once | INTRAMUSCULAR | Status: AC
  Administered 2023-03-15: 15 mg via INTRAVENOUS
  Filled 2023-03-15: qty 1

## 2023-03-15 MED ORDER — DEXAMETHASONE SODIUM PHOSPHATE 4 MG/ML IJ SOLN
4.0000 mg | Freq: Four times a day (QID) | INTRAMUSCULAR | Status: AC
  Administered 2023-03-16 (×4): 4 mg via INTRAVENOUS
  Filled 2023-03-15 (×4): qty 1

## 2023-03-15 MED ORDER — HYDROMORPHONE HCL 1 MG/ML IJ SOLN
1.0000 mg | Freq: Once | INTRAMUSCULAR | Status: AC
  Administered 2023-03-15: 1 mg via INTRAVENOUS
  Filled 2023-03-15: qty 1

## 2023-03-15 MED ORDER — INSULIN ASPART 100 UNIT/ML IJ SOLN
0.0000 [IU] | Freq: Every day | INTRAMUSCULAR | Status: DC
  Administered 2023-03-16: 3 [IU] via SUBCUTANEOUS

## 2023-03-15 MED ORDER — ATORVASTATIN CALCIUM 10 MG PO TABS
20.0000 mg | ORAL_TABLET | Freq: Every day | ORAL | Status: DC
  Administered 2023-03-16 – 2023-03-17 (×2): 20 mg via ORAL
  Filled 2023-03-15 (×2): qty 2

## 2023-03-15 MED ORDER — INSULIN ASPART 100 UNIT/ML IJ SOLN
0.0000 [IU] | Freq: Three times a day (TID) | INTRAMUSCULAR | Status: DC
  Administered 2023-03-16 (×2): 5 [IU] via SUBCUTANEOUS

## 2023-03-15 MED ORDER — DIVALPROEX SODIUM ER 500 MG PO TB24: 1000.0000 mg | ORAL_TABLET | Freq: Every day | ORAL | Status: DC

## 2023-03-15 MED ORDER — PREDNISONE 20 MG PO TABS
60.0000 mg | ORAL_TABLET | Freq: Once | ORAL | Status: AC
  Administered 2023-03-15: 60 mg via ORAL
  Filled 2023-03-15 (×2): qty 3

## 2023-03-15 MED ORDER — POLYETHYLENE GLYCOL 3350 17 G PO PACK: 17.0000 g | PACK | Freq: Every day | ORAL | Status: DC | PRN

## 2023-03-15 MED ORDER — OXYCODONE HCL 5 MG PO TABS
5.0000 mg | ORAL_TABLET | Freq: Once | ORAL | Status: AC
  Administered 2023-03-15: 5 mg via ORAL
  Filled 2023-03-15: qty 1

## 2023-03-15 MED ORDER — ONDANSETRON HCL 4 MG/2ML IJ SOLN
4.0000 mg | Freq: Once | INTRAMUSCULAR | Status: AC
  Administered 2023-03-15: 4 mg via INTRAVENOUS
  Filled 2023-03-15: qty 2

## 2023-03-15 MED ORDER — GADOBUTROL 1 MMOL/ML IV SOLN
10.0000 mL | Freq: Once | INTRAVENOUS | Status: AC | PRN
  Administered 2023-03-15: 10 mL via INTRAVENOUS

## 2023-03-15 MED ORDER — ENOXAPARIN SODIUM 80 MG/0.8ML IJ SOSY
70.0000 mg | PREFILLED_SYRINGE | INTRAMUSCULAR | Status: DC
  Administered 2023-03-16 (×2): 70 mg via SUBCUTANEOUS
  Filled 2023-03-15: qty 0.7
  Filled 2023-03-15: qty 0.8

## 2023-03-15 MED ORDER — DIAZEPAM 5 MG/ML IJ SOLN
2.5000 mg | Freq: Once | INTRAMUSCULAR | Status: AC
  Administered 2023-03-15: 2.5 mg via INTRAVENOUS
  Filled 2023-03-15: qty 2

## 2023-03-15 MED ORDER — VITAMIN B-12 1000 MCG PO TABS
1000.0000 ug | ORAL_TABLET | Freq: Every day | ORAL | Status: DC
  Administered 2023-03-16 – 2023-03-17 (×2): 1000 ug via ORAL
  Filled 2023-03-15 (×2): qty 1

## 2023-03-15 MED ORDER — ENOXAPARIN SODIUM 40 MG/0.4ML IJ SOSY: 40.0000 mg | PREFILLED_SYRINGE | INTRAMUSCULAR | Status: DC

## 2023-03-15 MED ORDER — FENTANYL CITRATE PF 50 MCG/ML IJ SOSY
50.0000 ug | PREFILLED_SYRINGE | Freq: Once | INTRAMUSCULAR | Status: AC
  Administered 2023-03-15: 50 ug via INTRAVENOUS
  Filled 2023-03-15: qty 1

## 2023-03-15 MED ORDER — GABAPENTIN 300 MG PO CAPS: 300.0000 mg | ORAL_CAPSULE | Freq: Three times a day (TID) | ORAL | Status: DC

## 2023-03-15 MED ORDER — PROCHLORPERAZINE EDISYLATE 10 MG/2ML IJ SOLN: 5.0000 mg | Freq: Four times a day (QID) | INTRAMUSCULAR | Status: DC | PRN

## 2023-03-15 MED ORDER — HYDROMORPHONE HCL 1 MG/ML IJ SOLN
1.0000 mg | INTRAMUSCULAR | Status: DC | PRN
  Administered 2023-03-16: 1 mg via INTRAVENOUS
  Filled 2023-03-15: qty 1

## 2023-03-15 NOTE — ED Provider Triage Note (Signed)
Emergency Medicine Provider Triage Evaluation Note  Kerri Ford , a 42 y.o. female  was evaluated in triage.  Pt complains of low back pain.  Patient with history of chronic back pain.  Patient reports that this morning she twisted and felt sudden onset right sided low back pain.  She took naproxen prior to arrival.  She is visibly uncomfortable, sitting in her wheelchair, twisted to the side to maintain a position of comfort.  Review of Systems  Positive: Low back pain Negative: Fever, urinary incontinence, bowel movement change, lower extremity weakness  Physical Exam  BP (!) 112/58   Pulse 100   Temp 97.8 F (36.6 C) (Oral)   Resp (!) 22   Ht 5\' 4"  (1.626 m)   Wt (!) 142.9 kg   SpO2 100%   BMI 54.07 kg/m  Gen:   Awake, moderate discomfort reported secondary to low back pain Resp:  Normal effort  MSK:   Moves extremities without difficulty, localizes pain to the right low back Other:    Medical Decision Making  Medically screening exam initiated at 10:26 AM.  Appropriate orders placed.  Kerri Ford was informed that the remainder of the evaluation will be completed by another provider, this initial triage assessment does not replace that evaluation, and the importance of remaining in the ED until their evaluation is complete.  Initial screening labs and imaging ordered.  Patient is aware of MSE process.  RN staff aware of plan for workup.   Kerri Fines, MD 03/15/23 [phone]

## 2023-03-15 NOTE — ED Notes (Signed)
Pt to MRI

## 2023-03-15 NOTE — ED Notes (Signed)
Back from CT, no changes.  

## 2023-03-15 NOTE — ED Provider Notes (Signed)
[place] EMERGENCY DEPARTMENT AT [facility] Provider Note   CSN: [id number] Arrival date & time: 03/15/23  1006     History  Chief Complaint  Patient presents with   Back Pain    Kerri Ford is a 42 y.o. female, hx of spinal stenosis, DDD, who presents to the ED secondary to severe right low back pain, starting today.  She states that she has nausea this morning, and twisted her body, stand vomit, and then started having excruciating right low back pain, and right leg pain.  She states it feels similar to her left side, when she had herniated disc.  She reports it is throbbing, and she has some numbness and tingling down her right leg.  She notes that it is better when she is laying down, has her knee bent.  Has taken naproxen and Flexeril with minimal relief.  She has history of a fusion and microdiscectomy.  Denies any fever, chills, IV drug use, urinary loss, or bowel loss.   Home Medications Prior to Admission medications   Medication Sig Start Date End Date Taking? Authorizing Provider  albuterol (PROVENTIL HFA;VENTOLIN HFA) 108 (90 Base) MCG/ACT inhaler Inhale 1-2 puffs into the lungs every 4 (four) hours as needed for wheezing or shortness of breath. Reported on 10/11/2015 04/20/16   Sherren Mocha, MD  atorvastatin (LIPITOR) 20 MG tablet Take 20 mg by mouth daily.    [provider]  blood glucose meter kit and supplies KIT Dispense based on patient and insurance preference. Use up to four times daily as directed. (FOR ICD-9 250.00, 250.01). 10/11/15   Sherren Mocha, MD  blood glucose meter kit and supplies Dispense based on patient and insurance preference. Use up to four times daily as directed. (FOR ICD-10 E10.9, E11.9). 10/05/21   Massengill, Harrold Donath, MD  Cholecalciferol (VITAMIN D-3 PO) Take 1 tablet by mouth daily.    [provider]  Continuous Blood Gluc Receiver (FREESTYLE LIBRE 14 DAY READER) DEVI 1 Units by Does not apply route as needed. 01/06/18    Sherren Mocha, MD  Continuous Blood Gluc Sensor (FREESTYLE LIBRE 14 DAY SENSOR) MISC 1 Units by Does not apply route every 14 (fourteen) days. 01/06/18   Sherren Mocha, MD  Cyanocobalamin (VITAMIN B-12 PO) Take 1 tablet by mouth daily.    [provider]  divalproex (DEPAKOTE ER) 500 MG 24 hr tablet Take 2 tablets (1,000 mg total) by mouth at bedtime. 07/26/22   [name], MD  gabapentin (NEURONTIN) 300 MG capsule Take 1 capsule (300 mg total) by mouth 3 (three) times daily. 10/05/21 11/04/21  Massengill, Harrold Donath, MD  glipiZIDE (GLUCOTROL) 5 MG tablet Take 1 tablet (5 mg total) by mouth 2 (two) times daily before a meal. 10/05/21 11/04/21  Massengill, Harrold Donath, MD  insulin aspart (NOVOLOG) 100 UNIT/ML FlexPen Inject 5 Units into the skin 3 (three) times daily with meals. 10/05/21   Massengill, Harrold Donath, MD  insulin glargine-yfgn (SEMGLEE) 100 UNIT/ML Pen Inject 13 Units into the skin daily. 10/05/21   Massengill, Harrold Donath, MD  Insulin Pen Needle (BD PEN NEEDLE NANO U/F) 32G X 4 MM MISC 1 Package by Does not apply route 2 (two) times daily. 03/23/18   Langston Reusing, MD  levocetirizine (XYZAL) 5 MG tablet Take 5 mg by mouth daily. 09/01/15   [provider]  lisinopril (ZESTRIL) 2.5 MG tablet Take 1 tablet (2.5 mg total) by mouth daily. 10/06/21 11/05/21  Phineas Inches, MD  loratadine (  CLARITIN) 10 MG tablet Take 10 mg by mouth at bedtime.    [provider]  MAGNESIUM PO Take 1 tablet by mouth daily.    [provider]  metFORMIN (GLUCOPHAGE) 500 MG tablet Take 1 tablet (500 mg total) by mouth 2 (two) times daily with a meal. 10/05/21 11/04/21  Massengill, Harrold Donath, MD  ONE TOUCH ULTRA TEST test strip USE TO TEST FOUR TIMES DAILY AS DIRECTED 12/15/17   Sherren Mocha, MD  [facility] DELICA LANCETS FINE MISC Use up to four times daily as directed due to hyperglycemia, weight change, medication monitoring. 04/10/17   Sherren Mocha, MD  pioglitazone (ACTOS) 45 MG tablet Take 1 tablet  (45 mg total) by mouth daily. 10/05/21 11/04/21  Massengill, Harrold Donath, MD  TURMERIC PO Take 1 capsule by mouth daily.    [provider]      Allergies    Tioconazole, Latex, and Aspartame and phenylalanine    Review of Systems   Review of Systems  Genitourinary:  Negative for difficulty urinating.  Musculoskeletal:  Positive for back pain.    Physical Exam Updated Vital Signs BP 117/65 (BP Location: Right Arm)   Pulse 92   Temp 97.9 F (36.6 C) (Oral)   Resp 18   Ht 5\' 4"  (1.626 m)   Wt (!) 142.9 kg   SpO2 100%   BMI 54.07 kg/m  Physical Exam Vitals and nursing note reviewed.  Constitutional:      General: She is not in acute distress.    Appearance: She is well-developed. She is obese.     Comments: Tearful  HENT:     Head: Normocephalic and atraumatic.  Eyes:     Conjunctiva/sclera: Conjunctivae normal.  Cardiovascular:     Rate and Rhythm: Normal rate and regular rhythm.     Heart sounds: No murmur heard. Pulmonary:     Effort: Pulmonary effort is normal. No respiratory distress.     Breath sounds: Normal breath sounds.  Abdominal:     Palpations: Abdomen is soft.     Tenderness: There is no abdominal tenderness.  Musculoskeletal:        General: No swelling.     Cervical back: Neck supple.     Comments: Tenderness to palpation of lumbar midline spine, with positive straight leg raise of right lower extremity.  Sensation intact, good strength bilaterally 5 out of 5 strength.  Skin:    General: Skin is warm and dry.     Capillary Refill: Capillary refill takes less than 2 seconds.     Comments: No wounds noted  Neurological:     Mental Status: She is alert.  Psychiatric:        Mood and Affect: Mood normal.     ED Results / Procedures / Treatments   Labs (all labs ordered are listed, but only abnormal results are displayed) Labs Reviewed  COMPREHENSIVE METABOLIC PANEL - Abnormal; Notable for the following components:      Result Value    Glucose, Bld 210 (*)    All other components within normal limits  HCG, SERUM, QUALITATIVE  CBC WITH DIFFERENTIAL/PLATELET  HCG, QUANTITATIVE, PREGNANCY    EKG None  Radiology CT Lumbar Spine Wo Contrast  Result Date: 03/15/2023 CLINICAL DATA:  Lower back pain.  Increased fracture risk. EXAM: CT LUMBAR SPINE WITHOUT CONTRAST TECHNIQUE: Multidetector CT imaging of the lumbar spine was performed without intravenous contrast administration. Multiplanar CT image reconstructions were also generated. RADIATION DOSE REDUCTION: This exam was  performed according to the departmental dose-optimization program which includes automated exposure control, adjustment of the mA and/or kV according to patient size and/or use of iterative reconstruction technique. COMPARISON:  CT abdomen and pelvis without contrast 12/09/2016 FINDINGS: Segmentation: There are 5 non-rib-bearing lumbar-type vertebral bodies. Alignment: No sagittal spondylolisthesis normal frontal alignment. Vertebrae: Vertebral body heights are maintained. Moderate of S1 disc space narrowing. L5-S1 and L4-5 endplate sclerosis and degenerative vacuum phenomenon. Degenerative Schmorl's nodes greatest at T11-12, T12-L1, L2-3, and L5-S1. No acute fracture. Paraspinal and other soft tissues: Incidental note of two retroaortic left renal veins which converge prior to the renal hilum. Disc levels: T12-L1: Minimal left intraforaminal endplate spurring. No significant neuroforaminal stenosis. No central canal stenosis. L1-2: Mild bilateral facet joint hypertrophy. No definite posterior disc bulge. No central canal or neuroforaminal stenosis. L2-3: Mild bilateral facet joint hypertrophy. No definite posterior disc bulge. Mild right intraforaminal endplate spurring. No definite central canal or neuroforaminal stenosis. L3-4: Mild bilateral facet joint hypertrophy. Mild right intraforaminal endplate spurring. No significant neuroforaminal stenosis. No central canal  stenosis. L4-5: Moderate left-greater-than-right facet joint hypertrophy. Mild broad-based posterior disc osteophyte complex with moderate bilateral intraforaminal disc osteophyte extension. Moderate left and mild right neuroforaminal stenosis. Narrowing of the left lateral recess. Mild osseous central canal stenosis. L5-S1: Mild bilateral facet joint hypertrophy. Mild-to-moderate broad-based posterior disc osteophyte complex with moderate bilateral intraforaminal extension. Moderate bilateral neuroforaminal stenosis. Mild narrowing of the lateral recesses and mild central canal stenosis. IMPRESSION: 1. No acute fracture. 2. Multilevel degenerative disc and joint changes as above. 3. Moderate left and mild right neuroforaminal stenosis at L4-5. Narrowing of the left lateral recess at L4-5. 4. Moderate bilateral neuroforaminal stenosis at L5-S1. Mild narrowing of the lateral recesses and mild central canal stenosis at L5-S1. Electronically Signed   By: Neita Garnet M.D.   On: 03/15/2023 15:17    Procedures Procedures    Medications Ordered in ED Medications  HYDROmorphone (DILAUDID) injection 1 mg (has no administration in time range)  oxyCODONE (Oxy IR/ROXICODONE) immediate release tablet 5 mg (5 mg Oral Given 03/15/23 1147)  fentaNYL (SUBLIMAZE) injection 50 mcg (50 mcg Intravenous Given 03/15/23 1147)  ondansetron (ZOFRAN) injection 4 mg (4 mg Intravenous Given 03/15/23 1147)  diazepam (VALIUM) injection 2.5 mg (2.5 mg Intravenous Given 03/15/23 1428)  ketorolac (TORADOL) 15 MG/ML injection 15 mg (15 mg Intravenous Given 03/15/23 1426)    ED Course/ Medical Decision Making/ A&P                                 Medical Decision Making Amount and/or Complexity of Data Reviewed Labs: ordered. Radiology: ordered.    Details: CT scan shows spinal stenosis, and neuroforaminal narrowing.  As well as degenerative disc disease Discussion of management or test interpretation with external provider(s):  Discussed with patient, intractable pain, no improvement after Valium, Toradol, oxycodone, fentanyl, will give 1 dose of Dilaudid.  Order MRI given intractable pain, difficulty ambulating.  Does have several stairs at home that she has to go up.  To get to her apartment handed off to [facility], [place], for follow-up on MRI, and disposition  Risk Prescription drug management.    Final Clinical Impression(s) / ED Diagnoses Final diagnoses:  None    Rx / DC Orders ED Discharge Orders     None         Uzma Hellmer, Harley Alto, PA 03/15/23 1532    Anders Simmonds  T, DO 03/16/23 [id number]

## 2023-03-15 NOTE — ED Triage Notes (Signed)
Patient from home by [facility] for back pain, twisted back this am pain 8/10. Lower right side, radiating down right leg, endorses weakness. Patient was able to sit to floor after twist. Patient states pain was alleviated when she laid down. Patient writhing in pain at triage. Hx of low back pain, degenerative disk disease. Patient endorses nausea associated to pain.   EMS vitals HR 100 BP 130/78 RR 20  O2 99

## 2023-03-15 NOTE — ED Provider Notes (Signed)
Care assumed from previous provider.  See note for full HPI.  In summation 42 year old here for evaluation of back pain which goes down her right lower extremity.  History of similar.  Previously followed by Dr. Wynetta Emery when she had symptoms down her left lower extremity.  She is unable to walk due to pain.  Patient seen by previous provider, labs did not show any significant findings.  She has no UTI symptoms, no abdominal pain.  No swelling to lower extremities.  Ct scan show stenosis, her pain is uncontrolled.  Will plan on MRI, additional dose of pain medicine and reassess Physical Exam  BP 136/62   Pulse (!) 106   Temp 98 F (36.7 C) (Oral)   Resp 20   Ht 5\' 4"  (1.626 m)   Wt (!) 142.9 kg   SpO2 98%   BMI 54.07 kg/m   Physical Exam Vitals and nursing note reviewed.  Constitutional:      General: She is not in acute distress.    Appearance: She is well-developed. She is not ill-appearing.  HENT:     Head: Atraumatic.  Eyes:     Pupils: Pupils are equal, round, and reactive to light.  Cardiovascular:     Rate and Rhythm: Normal rate.  Pulmonary:     Effort: No respiratory distress.  Abdominal:     General: There is no distension.  Musculoskeletal:        General: Normal range of motion.     Cervical back: Normal range of motion.     Comments: Straight leg raise on right.  2+ DP, PT pulses, compartments soft, no lower extremity edema  Skin:    General: Skin is warm and dry.  Neurological:     General: No focal deficit present.     Mental Status: She is alert.  Psychiatric:        Mood and Affect: Mood normal.     Procedures  Procedures Labs Reviewed  COMPREHENSIVE METABOLIC PANEL - Abnormal; Notable for the following components:      Result Value   Glucose, Bld 210 (*)    All other components within normal limits  HCG, SERUM, QUALITATIVE  CBC WITH DIFFERENTIAL/PLATELET  HCG, QUANTITATIVE, PREGNANCY  HIV ANTIBODY (ROUTINE TESTING W REFLEX)  CBC  CREATININE, SERUM   HEMOGLOBIN A1C   MR LUMBAR SPINE W WO CONTRAST  Result Date: 03/15/2023 CLINICAL DATA:  Low back pain, prior surgery, new symptoms EXAM: MRI LUMBAR SPINE WITHOUT AND WITH CONTRAST TECHNIQUE: Multiplanar and multiecho pulse sequences of the lumbar spine were obtained without and with intravenous contrast. CONTRAST:  10mL GADAVIST GADOBUTROL 1 MMOL/ML IV SOLN COMPARISON:  04/28/2018 MRI lumbar spine, correlation is made with 03/15/2023 CT lumbar spine. FINDINGS: Segmentation:  5 lumbar type vertebral bodies. Alignment: No significant listhesis. Preservation of the normal lumbar lordosis. Vertebrae: No acute fracture, evidence of discitis, or suspicious osseous lesion. T1 and T2 hyperintense foci in the vertebral bodies, most likely benign hemangiomas. No abnormal enhancement. Conus medullaris and cauda equina: Conus extends to the L1 level. Conus and cauda equina appear normal. No abnormal enhancement. Paraspinal and other soft tissues: No acute finding. Disc levels: T12-L1: No significant disc bulge. No spinal canal stenosis or neural foraminal narrowing. L1-L2: No significant disc bulge. No spinal canal stenosis or neural foraminal narrowing. L2-L3: No significant disc bulge. No spinal canal stenosis or neural foraminal narrowing. L3-L4: Minimal disc bulge. Mild facet arthropathy. No spinal canal stenosis or neural foraminal narrowing. L4-L5: Mild disc  bulge with right extreme lateral protrusion, which likely contacts the exiting right L4 nerve roots. Mild left greater than right facet arthropathy. No spinal canal stenosis or neural foraminal narrowing. L5-S1: Small central disc protrusion. Mild facet arthropathy. No spinal canal stenosis or neural foraminal narrowing. IMPRESSION: 1. L4-L5 right extreme lateral protrusion, which likely contacts the exiting right L4 nerve roots. 2. No spinal canal stenosis or neural foraminal narrowing. 3. Mild facet arthropathy in the lower lumbar spine. Electronically Signed    By: Wiliam Ke M.D.   On: 03/15/2023 19:11   CT Lumbar Spine Wo Contrast  Result Date: 03/15/2023 CLINICAL DATA:  Lower back pain.  Increased fracture risk. EXAM: CT LUMBAR SPINE WITHOUT CONTRAST TECHNIQUE: Multidetector CT imaging of the lumbar spine was performed without intravenous contrast administration. Multiplanar CT image reconstructions were also generated. RADIATION DOSE REDUCTION: This exam was performed according to the departmental dose-optimization program which includes automated exposure control, adjustment of the mA and/or kV according to patient size and/or use of iterative reconstruction technique. COMPARISON:  CT abdomen and pelvis without contrast 12/09/2016 FINDINGS: Segmentation: There are 5 non-rib-bearing lumbar-type vertebral bodies. Alignment: No sagittal spondylolisthesis normal frontal alignment. Vertebrae: Vertebral body heights are maintained. Moderate of S1 disc space narrowing. L5-S1 and L4-5 endplate sclerosis and degenerative vacuum phenomenon. Degenerative Schmorl's nodes greatest at T11-12, T12-L1, L2-3, and L5-S1. No acute fracture. Paraspinal and other soft tissues: Incidental note of two retroaortic left renal veins which converge prior to the renal hilum. Disc levels: T12-L1: Minimal left intraforaminal endplate spurring. No significant neuroforaminal stenosis. No central canal stenosis. L1-2: Mild bilateral facet joint hypertrophy. No definite posterior disc bulge. No central canal or neuroforaminal stenosis. L2-3: Mild bilateral facet joint hypertrophy. No definite posterior disc bulge. Mild right intraforaminal endplate spurring. No definite central canal or neuroforaminal stenosis. L3-4: Mild bilateral facet joint hypertrophy. Mild right intraforaminal endplate spurring. No significant neuroforaminal stenosis. No central canal stenosis. L4-5: Moderate left-greater-than-right facet joint hypertrophy. Mild broad-based posterior disc osteophyte complex with moderate  bilateral intraforaminal disc osteophyte extension. Moderate left and mild right neuroforaminal stenosis. Narrowing of the left lateral recess. Mild osseous central canal stenosis. L5-S1: Mild bilateral facet joint hypertrophy. Mild-to-moderate broad-based posterior disc osteophyte complex with moderate bilateral intraforaminal extension. Moderate bilateral neuroforaminal stenosis. Mild narrowing of the lateral recesses and mild central canal stenosis. IMPRESSION: 1. No acute fracture. 2. Multilevel degenerative disc and joint changes as above. 3. Moderate left and mild right neuroforaminal stenosis at L4-5. Narrowing of the left lateral recess at L4-5. 4. Moderate bilateral neuroforaminal stenosis at L5-S1. Mild narrowing of the lateral recesses and mild central canal stenosis at L5-S1. Electronically Signed   By: Neita Garnet M.D.   On: 03/15/2023 15:17    ED Course / MDM   Clinical Course as of 03/15/23 2117  Tue Mar 15, 2023  2027 Esperanza Richters with NRSGRY steroids, pain control, gabapentin. If can't walk medicine and will see outpatient if pain controlled [BH]  2101 Dr. Margo Aye with medicine to see for admission [BH]    Clinical Course User Index [BH] Gordon Ducre A, PA-C   Care assumed from previous provider.  See note for full HPI.  In summation 42 year old here for evaluation of back pain which goes down her right lower extremity.  History of similar.  Previously followed by Dr. Wynetta Emery when she had symptoms down her left lower extremity.  She is unable to walk due to pain.  Patient seen by previous provider, labs did not show any  significant findings.  She has no UTI symptoms, no abdominal pain.  No swelling to lower extremities.  Ct scan show stenosis, her pain is uncontrolled.  Will plan on MRI, additional dose of pain medicine and reassess   Pain is uncontrolled despite multiple pain medications, improved from initial evaluation however still in moderate pain.  She can stand however cannot  bear weight on her right lower extremity due to the pain in her back.  Will give additional pain medicine, touch base with neurosurgery for rec.  Discussed with neurosurgery NP.  Recommends pain control, steroids, gabapentin.  If pain is controlled tomorrow she can follow-up outpatient in the office.  Discussed results with patient, family.  Will admit for intractable back pain.  Suspect based off her MRI this is most likely radicular nature I low suspicion for acute ACS, PE, dissection, ischemic event, VTE, infectious process.  CONSULT with Dr. Margo Aye with medicine who is agreeable to evaluate patient for admission  The patient appears reasonably stabilized for admission considering the current resources, flow, and capabilities available in the ED at this time, and I doubt any other [facility] requiring further screening and/or treatment in the ED prior to admission.   Medical Decision Making Amount and/or Complexity of Data Reviewed Independent Historian: friend External Data Reviewed: labs, radiology and notes. Labs: ordered. Decision-making details documented in ED Course. Radiology: ordered and independent interpretation performed. Decision-making details documented in ED Course.  Risk OTC drugs. Prescription drug management. Parenteral controlled substances. Decision regarding hospitalization. Diagnosis or treatment significantly limited by social determinants of health.          Dolce Sylvia A, PA-C 03/15/23 2117    Benjiman Core, MD 03/15/23 2219

## 2023-03-15 NOTE — ED Notes (Signed)
Back from MRI, alert, NAD, calm.

## 2023-03-15 NOTE — ED Notes (Signed)
Remains in MRI 

## 2023-03-15 NOTE — Progress Notes (Signed)
PHARMACIST - PHYSICIAN COMMUNICATION  CONCERNING:  Enoxaparin (Lovenox) for DVT Prophylaxis    RECOMMENDATION: Patient was prescribed enoxaprin 40mg  q24 hours for VTE prophylaxis.   Filed Weights   03/15/23 1011  Weight: (!) 142.9 kg (315 lb)    Body mass index is 54.07 kg/m.  Estimated Creatinine Clearance: 113.2 mL/min (by C-G formula based on SCr of 0.92 mg/dL).   Based on [facility] policy patient is candidate for enoxaparin 0.5mg /kg TBW SQ every 24 hours based on BMI being >30.  DESCRIPTION: Pharmacy has adjusted enoxaparin dose per [facility] policy.  Patient is now receiving enoxaparin 70 mg every 24 hours    Sherrie George Clinical Pharmacist  03/15/2023 9:41 PM

## 2023-03-16 DIAGNOSIS — Z8639 Personal history of other endocrine, nutritional and metabolic disease: Secondary | ICD-10-CM

## 2023-03-16 DIAGNOSIS — M549 Dorsalgia, unspecified: Secondary | ICD-10-CM | POA: Diagnosis not present

## 2023-03-16 DIAGNOSIS — E782 Mixed hyperlipidemia: Secondary | ICD-10-CM

## 2023-03-16 DIAGNOSIS — M5441 Lumbago with sciatica, right side: Secondary | ICD-10-CM

## 2023-03-16 DIAGNOSIS — M5136 Other intervertebral disc degeneration, lumbar region with discogenic back pain only: Secondary | ICD-10-CM

## 2023-03-16 LAB — HEMOGLOBIN A1C
Hgb A1c MFr Bld: 8.5 % — ABNORMAL HIGH (ref 4.8–5.6)
Mean Plasma Glucose: 197.25 mg/dL

## 2023-03-16 LAB — PHOSPHORUS: Phosphorus: 3.2 mg/dL (ref 2.5–4.6)

## 2023-03-16 LAB — BASIC METABOLIC PANEL
Anion gap: 9 (ref 5–15)
BUN: 10 mg/dL (ref 6–20)
CO2: 20 mmol/L — ABNORMAL LOW (ref 22–32)
Calcium: 8.9 mg/dL (ref 8.9–10.3)
Chloride: 102 mmol/L (ref 98–111)
Creatinine, Ser: 0.62 mg/dL (ref 0.44–1.00)
GFR, Estimated: >60 mL/min (ref >60)
Glucose, Bld: 287 mg/dL — ABNORMAL HIGH (ref 70–99)
Potassium: 4 mmol/L (ref 3.5–5.1)
Sodium: 131 mmol/L — ABNORMAL LOW (ref 135–145)

## 2023-03-16 LAB — CBC
HCT: 39.2 % (ref 36.0–46.0)
Hemoglobin: 13.1 g/dL (ref 12.0–15.0)
MCH: 29.4 pg (ref 26.0–34.0)
MCHC: 33.4 g/dL (ref 30.0–36.0)
MCV: 87.9 fL (ref 80.0–100.0)
Platelets: 299 10*3/uL (ref 150–400)
RBC: 4.46 MIL/uL (ref 3.87–5.11)
RDW: 12.9 % (ref 11.5–15.5)
WBC: 7.4 10*3/uL (ref 4.0–10.5)
nRBC: 0 % (ref 0.0–0.2)

## 2023-03-16 LAB — HIV ANTIBODY (ROUTINE TESTING W REFLEX): HIV Screen 4th Generation wRfx: NONREACTIVE

## 2023-03-16 LAB — GLUCOSE, CAPILLARY
Glucose-Capillary: 281 mg/dL — ABNORMAL HIGH (ref 70–99)
Glucose-Capillary: 310 mg/dL — ABNORMAL HIGH (ref 70–99)

## 2023-03-16 LAB — HCG, QUANTITATIVE, PREGNANCY: hCG, Beta Chain, Quant, S: 1 m[IU]/mL (ref <5)

## 2023-03-16 LAB — MAGNESIUM: Magnesium: 1.9 mg/dL (ref 1.7–2.4)

## 2023-03-16 LAB — CBG MONITORING, ED: Glucose-Capillary: 275 mg/dL — ABNORMAL HIGH (ref 70–99)

## 2023-03-16 MED ORDER — INSULIN ASPART 100 UNIT/ML IJ SOLN
0.0000 [IU] | Freq: Three times a day (TID) | INTRAMUSCULAR | Status: DC
  Administered 2023-03-16 – 2023-03-17 (×2): 8 [IU] via SUBCUTANEOUS
  Administered 2023-03-17: 5 [IU] via SUBCUTANEOUS

## 2023-03-16 MED ORDER — LUMATEPERONE TOSYLATE 42 MG PO CAPS
42.0000 mg | ORAL_CAPSULE | Freq: Every day | ORAL | Status: DC
  Administered 2023-03-16 – 2023-03-17 (×2): 42 mg via ORAL
  Filled 2023-03-16 (×2): qty 1

## 2023-03-16 MED ORDER — GABAPENTIN 100 MG PO CAPS
100.0000 mg | ORAL_CAPSULE | Freq: Three times a day (TID) | ORAL | Status: DC
  Administered 2023-03-16 – 2023-03-17 (×4): 100 mg via ORAL
  Filled 2023-03-16 (×4): qty 1
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
## 2023-03-16 NOTE — Evaluation (Signed)
Occupational Therapy Evaluation Patient Details Name: Kerri Ford MRN: [id number] DOB: 02-17-1981 Today's Date: 03/16/2023   History of Present Illness 42 y.o. female presents to [facility] on 03/15/2023 with back pain. MRI reveals L4-5 disc protrusion, likely contacting L4 nerve roots. PMH includes bipolar disorder, spinal stenosis, OSA, fibromyalgia, migraines, DMII, PTSD.   Clinical Impression   Pt admitted for above, she reports good understanding of limitations due to back pain and discussed with OT current strategies that she has been using to adapt her ADLs. Pt with c/o RLE numbness, ambulated to bathroom with supervision, back pain currently on the lower end. Educated pt on AE to assist with ADLs to reduce risk of increased back pain with LB activities, pt verbalized understanding of equipment. Discussed strategies such as using cups during oral routine at sink. Pt would benefit from continued acute skilled OT services to educate on compensatory strategies and AE prn. Anticipate no follow-up OT needed post acute.        If plan is discharge home, recommend the following: Assistance with cooking/housework    Functional Status Assessment  Patient has had a recent decline in their functional status and demonstrates the ability to make significant improvements in function in a reasonable and predictable amount of time.  Equipment Recommendations  Other (comment) (AE for LB dressing/bathing as needed)    Recommendations for Other Services       Precautions / Restrictions Precautions Precautions: Fall Restrictions Weight Bearing Restrictions: No      Mobility Bed Mobility Overal bed mobility: Modified Independent                  Transfers Overall transfer level: Needs assistance Equipment used: None Transfers: Sit to/from Stand Sit to Stand: Supervision                  Balance Overall balance assessment: Mild deficits observed, not formally tested                                          ADL either performed or assessed with clinical judgement   ADL Overall ADL's : Needs assistance/impaired Eating/Feeding: Sitting;Independent   Grooming: Standing;Supervision/safety;Wash/dry face   Upper Body Bathing: Sitting;Modified independent   Lower Body Bathing: Sitting/lateral leans;Minimal assistance   Upper Body Dressing : Sitting;Modified independent   Lower Body Dressing: Sitting/lateral leans;Supervision/safety;Set up Lower Body Dressing Details (indicate cue type and reason): Pt donned/doffed bilat socks sitting EOB using bed surface to prop BLEs. Toilet Transfer: Supervision/safety;Ambulation   Toileting- Clothing Manipulation and Hygiene: Supervision/safety;Sit to/from stand       Functional mobility during ADLs: Supervision/safety General ADL Comments: Educated pt on AE to assist with LB activities: sockaid, reacher, toilet tongs, dressing stick. (no shoe horn or long handle sponge was in AE bag)     Vision         Perception         Praxis         Pertinent Vitals/Pain Pain Assessment Pain Assessment: 0-10 Pain Score: 2  Pain Location: back Pain Descriptors / Indicators: Aching, Discomfort Pain Intervention(s): Limited activity within patient's tolerance, Monitored during session     Extremity/Trunk Assessment Upper Extremity Assessment Upper Extremity Assessment: Overall WFL for tasks assessed   Lower Extremity Assessment Lower Extremity Assessment: Overall WFL for tasks assessed (Reports some RLE numbness)   Cervical / Trunk Assessment Cervical / Trunk  Assessment: Normal   Communication Communication Communication: No apparent difficulties Cueing Techniques: Verbal cues;Gestural cues   Cognition Arousal: Alert Behavior During Therapy: WFL for tasks assessed/performed Overall Cognitive Status: Within Functional Limits for tasks assessed                                        General Comments       Exercises     Shoulder Instructions      Home Living Family/patient expects to be discharged to:: Private residence Living Arrangements: Non-relatives/Friends Available Help at Discharge: Friend(s);Available PRN/intermittently Type of Home: Apartment Home Access: Stairs to enter [employer] of Steps: 3+6 (3 steps on the outside and 6 interior steps to enter apt) Entrance Stairs-Rails: None;Left (3 exterior steps have no rail, 6 interior steps has a rail on the left) Home Layout: One level     Bathroom Shower/Tub: Chief Strategy Officer: Standard Bathroom Accessibility: Yes   Home Equipment: Shower seat;Adaptive equipment Adaptive Equipment: Reacher Additional Comments: just ordered shower seat. Pt reports a hx of falls in the last year 3-4,some controlled      Prior Functioning/Environment Prior Level of Function : Working/employed;Driving;Needs assist;History of Falls (last six months)             Mobility Comments: Pt ambulated without assistance prior to hospitalization ADLs Comments: Pt reports requiring assistance with bringing groceries into the apartment        OT Problem List: Pain;Obesity;Impaired balance (sitting and/or standing)      OT Treatment/Interventions: Self-care/ADL training;Therapeutic exercise;Balance training;Therapeutic activities;Patient/family education    OT Goals(Current goals can be found in the care plan section) Acute Rehab OT Goals Patient Stated Goal: To get back pain under control OT Goal Formulation: With patient Time For Goal Achievement: 03/30/23 Potential to Achieve Goals: Good ADL Goals Pt Will Perform Grooming: with modified independence;standing Pt Will Perform Lower Body Bathing: with modified independence;sitting/lateral leans;with adaptive equipment Pt Will Perform Lower Body Dressing: with modified independence;sit to/from stand;with adaptive equipment Pt Will  Perform Tub/Shower Transfer: with modified independence;Tub transfer;shower seat  OT Frequency: Min 1X/week    Co-evaluation              AM-PAC OT "6 Clicks" Daily Activity     Outcome Measure Help from another person eating meals?: None Help from another person taking care of personal grooming?: A Little Help from another person toileting, which includes using toliet, bedpan, or urinal?: A Little Help from another person bathing (including washing, rinsing, drying)?: A Little Help from another person to put on and taking off regular upper body clothing?: None Help from another person to put on and taking off regular lower body clothing?: A Little 6 Click Score: 20   End of Session Nurse Communication: Mobility status  Activity Tolerance: Patient tolerated treatment well Patient left: in bed;with call bell/phone within reach;with bed alarm set  OT Visit Diagnosis: Unsteadiness on feet (R26.81);Pain Pain - Right/Left:  (back)                Time: 7829-5621 OT Time Calculation (min): 27 min Charges:  OT General Charges $OT Visit: 1 Visit OT Evaluation $OT Eval Low Complexity: 1 Low OT Treatments $Self Care/Home Management : 8-22 mins  03/16/2023  AB, OTR/L  Acute Rehabilitation Services  Office: [phone]   Tristan Schroeder 03/16/2023, 4:57 PM

## 2023-03-16 NOTE — Progress Notes (Signed)
[facility]                                                                              Zyrielle Hazard, is a 42 y.o. female, DOB - 03-25-1981, GNF:[id number] Admit date - 03/15/2023    Outpatient Primary MD for the patient is Sherren Mocha, MD  LOS - 0  days  Chief Complaint  Patient presents with   Back Pain       Brief summary   Patient is a 42 year old female with bipolar disorder, spinal stenosis and lumbosacral region, OSA on CPAP, fibromyalgia, migraines presented to ED with intractable back pain after twisting her back.  Immediately felt a pop.  She was in her usual state of health prior to this.  Presented to the ED due to severe back pain, radiating to her right lower extremity.  In the ED, she received IV opioid-based analgesics.  Afebrile no leukocytosis.  MRI lumbar spine revealed L4-L5 right extreme lateral protrusion which likely contacts the existing right L4 nerve roots.  The patient was unable to get her pain controlled in the ED and was unable to ambulate.  EDP requested admission for pain control.   Assessment & Plan    Principal Problem:   Intractable back pain with history of lumbar degenerative disc disease -Patient presented with intractable back pain after twisting her back and radiculopathy, unable to ambulate.  Patient has a history of lumbar DJD, followed by neurosurgery outpatient, Dr. Wynetta Emery -Pain was uncontrolled despite multiple pain medications in the ED, hence admitted  -EDP discussed with neurosurgery, recommended pain control, steroids, gabapentin.  If pain is controlled, can be discharged and follow-up outpatient in office. -MRI lumbar spine showed L4-L5 right extreme lateral protrusion which likely contacts the exiting right L4 nerve roots. -Pain improving, continue IV Decadron, IV Dilaudid for severe pain as needed, oxycodone for moderate pain, gabapentin 100 mg 3 times daily -PT OT evaluation  Active Problems:    Mixed  hyperlipidemia -Continue Lipitor    Type 2 diabetes mellitus without complication, without long-term current use of insulin (HCC) -Outpatient on Glucotrol, metformin, Mounjaro, Actos -Hemoglobin A1c 8.5 - CBG (last 3)  Recent Labs    03/15/23 2146 03/16/23 0831 03/16/23 1252  GLUCAP 113* 275* 281*   -Increase sliding scale insulin to moderate, continue nightly coverage    OSA (obstructive sleep apnea) -Continue O2  History of bipolar disorder -Currently stable, continue lumateperone    Morbid obesity Estimated body mass index is 54.07 kg/m as calculated from the following:   Height as of this encounter: 5\' 4"  (1.626 m).   Weight as of this encounter: 142.9 kg.  Code Status: Full code DVT Prophylaxis:  Lovenox   Level of Care: Level of care: Med-Surg Family Communication: Updated patient Disposition Plan:      Remains inpatient appropriate: Hopefully DC home tomorrow if pain controlled.   Procedures:  MRI lumbar spine  Consultants:   EDP discussed with neurosurgery  Antimicrobials:   Anti-infectives (From admission, onward)    None          Medications  atorvastatin  20 mg Oral Daily  cyanocobalamin  1,000 mcg Oral Daily   dexamethasone (DECADRON) injection  4 mg Intravenous Q6H   enoxaparin (LOVENOX) injection  70 mg Subcutaneous Q24H   insulin aspart  0-15 Units Subcutaneous TID WC   insulin aspart  0-5 Units Subcutaneous QHS   lumateperone tosylate  42 mg Oral Daily      Subjective:   Jashya Acheampong was seen and examined today.  States pain is much better controlled today, 7/10 in the lower back, radiating to the right leg, no urinary incontinence or weakness.  Patient denies dizziness, chest pain, shortness of breath, abdominal pain, N/V.  Objective:   Vitals:   03/16/23 0542 03/16/23 0545 03/16/23 0836 03/16/23 0900  BP:  139/80 139/75 139/75  Pulse:  100 93 (!) 104  Resp:  18 18 20   Temp: 98.3 F (36.8 C)  98.2 F (36.8 C) 98.9  F (37.2 C)  TempSrc: Oral   Oral  SpO2:  96% 100% 99%  Weight:      Height:       No intake or output data in the 24 hours ending 03/16/23 1307   Wt Readings from Last 3 Encounters:  03/15/23 (!) 142.9 kg  03/19/22 132.9 kg  02/26/22 132.5 kg     Exam General: Alert and oriented x 3, NAD Cardiovascular: S1 S2 auscultated,  RRR Respiratory: Clear to auscultation bilaterally, no wheezing Gastrointestinal: Soft, nontender, nondistended, + bowel sounds Ext: no pedal edema bilaterally Neuro: Strength 5/5 upper and lower extremities bilaterally Psych: Normal affect     Data Reviewed:  I have personally reviewed following labs    CBC Lab Results  Component Value Date   WBC 7.4 03/16/2023   RBC 4.46 03/16/2023   HGB 13.1 03/16/2023   HCT 39.2 03/16/2023   MCV 87.9 03/16/2023   MCH 29.4 03/16/2023   PLT 299 03/16/2023   MCHC 33.4 03/16/2023   RDW 12.9 03/16/2023   LYMPHSABS 1.3 03/15/2023   MONOABS 0.5 03/15/2023   EOSABS 0.1 03/15/2023   BASOSABS 0.0 03/15/2023     Last metabolic panel Lab Results  Component Value Date   NA 131 (L) 03/16/2023   K 4.0 03/16/2023   CL 102 03/16/2023   CO2 20 (L) 03/16/2023   BUN 10 03/16/2023   CREATININE 0.62 03/16/2023   GLUCOSE 287 (H) 03/16/2023   GFRNONAA >60 03/16/2023   GFRAA >60 11/09/2017   CALCIUM 8.9 03/16/2023   PHOS 3.2 03/16/2023   PROT 7.2 03/15/2023   ALBUMIN 3.9 03/15/2023   LABGLOB 2.9 05/04/2017   AGRATIO 1.5 05/04/2017   BILITOT 0.5 03/15/2023   ALKPHOS 55 03/15/2023   AST 22 03/15/2023   ALT 32 03/15/2023   ANIONGAP 9 03/16/2023    CBG (last 3)  Recent Labs    03/15/23 2146 03/16/23 0831 03/16/23 1252  GLUCAP 113* 275* 281*      Coagulation Profile: No results for input(s): "INR", "PROTIME" in the last 168 hours.   Radiology Studies: I have personally reviewed the imaging studies  MR LUMBAR SPINE W WO CONTRAST  Result Date: 03/15/2023 CLINICAL DATA:  Low back pain, prior surgery,  new symptoms EXAM: MRI LUMBAR SPINE WITHOUT AND WITH CONTRAST TECHNIQUE: Multiplanar and multiecho pulse sequences of the lumbar spine were obtained without and with intravenous contrast. CONTRAST:  10mL GADAVIST GADOBUTROL 1 MMOL/ML IV SOLN COMPARISON:  04/28/2018 MRI lumbar spine, correlation is made with 03/15/2023 CT lumbar spine. FINDINGS: Segmentation:  5 lumbar type vertebral bodies. Alignment: No significant listhesis. Preservation  of the normal lumbar lordosis. Vertebrae: No acute fracture, evidence of discitis, or suspicious osseous lesion. T1 and T2 hyperintense foci in the vertebral bodies, most likely benign hemangiomas. No abnormal enhancement. Conus medullaris and cauda equina: Conus extends to the L1 level. Conus and cauda equina appear normal. No abnormal enhancement. Paraspinal and other soft tissues: No acute finding. Disc levels: T12-L1: No significant disc bulge. No spinal canal stenosis or neural foraminal narrowing. L1-L2: No significant disc bulge. No spinal canal stenosis or neural foraminal narrowing. L2-L3: No significant disc bulge. No spinal canal stenosis or neural foraminal narrowing. L3-L4: Minimal disc bulge. Mild facet arthropathy. No spinal canal stenosis or neural foraminal narrowing. L4-L5: Mild disc bulge with right extreme lateral protrusion, which likely contacts the exiting right L4 nerve roots. Mild left greater than right facet arthropathy. No spinal canal stenosis or neural foraminal narrowing. L5-S1: Small central disc protrusion. Mild facet arthropathy. No spinal canal stenosis or neural foraminal narrowing. IMPRESSION: 1. L4-L5 right extreme lateral protrusion, which likely contacts the exiting right L4 nerve roots. 2. No spinal canal stenosis or neural foraminal narrowing. 3. Mild facet arthropathy in the lower lumbar spine. Electronically Signed   By: Wiliam Ke M.D.   On: 03/15/2023 19:11   CT Lumbar Spine Wo Contrast  Result Date: 03/15/2023 CLINICAL DATA:   Lower back pain.  Increased fracture risk. EXAM: CT LUMBAR SPINE WITHOUT CONTRAST TECHNIQUE: Multidetector CT imaging of the lumbar spine was performed without intravenous contrast administration. Multiplanar CT image reconstructions were also generated. RADIATION DOSE REDUCTION: This exam was performed according to the departmental dose-optimization program which includes automated exposure control, adjustment of the mA and/or kV according to patient size and/or use of iterative reconstruction technique. COMPARISON:  CT abdomen and pelvis without contrast 12/09/2016 FINDINGS: Segmentation: There are 5 non-rib-bearing lumbar-type vertebral bodies. Alignment: No sagittal spondylolisthesis normal frontal alignment. Vertebrae: Vertebral body heights are maintained. Moderate of S1 disc space narrowing. L5-S1 and L4-5 endplate sclerosis and degenerative vacuum phenomenon. Degenerative Schmorl's nodes greatest at T11-12, T12-L1, L2-3, and L5-S1. No acute fracture. Paraspinal and other soft tissues: Incidental note of two retroaortic left renal veins which converge prior to the renal hilum. Disc levels: T12-L1: Minimal left intraforaminal endplate spurring. No significant neuroforaminal stenosis. No central canal stenosis. L1-2: Mild bilateral facet joint hypertrophy. No definite posterior disc bulge. No central canal or neuroforaminal stenosis. L2-3: Mild bilateral facet joint hypertrophy. No definite posterior disc bulge. Mild right intraforaminal endplate spurring. No definite central canal or neuroforaminal stenosis. L3-4: Mild bilateral facet joint hypertrophy. Mild right intraforaminal endplate spurring. No significant neuroforaminal stenosis. No central canal stenosis. L4-5: Moderate left-greater-than-right facet joint hypertrophy. Mild broad-based posterior disc osteophyte complex with moderate bilateral intraforaminal disc osteophyte extension. Moderate left and mild right neuroforaminal stenosis. Narrowing of the  left lateral recess. Mild osseous central canal stenosis. L5-S1: Mild bilateral facet joint hypertrophy. Mild-to-moderate broad-based posterior disc osteophyte complex with moderate bilateral intraforaminal extension. Moderate bilateral neuroforaminal stenosis. Mild narrowing of the lateral recesses and mild central canal stenosis. IMPRESSION: 1. No acute fracture. 2. Multilevel degenerative disc and joint changes as above. 3. Moderate left and mild right neuroforaminal stenosis at L4-5. Narrowing of the left lateral recess at L4-5. 4. Moderate bilateral neuroforaminal stenosis at L5-S1. Mild narrowing of the lateral recesses and mild central canal stenosis at L5-S1. Electronically Signed   By: Neita Garnet M.D.   On: 03/15/2023 15:17       Shakiyah Cirilo M.D. [facility] 03/16/2023, 1:07 PM  Available via Epic secure chat 7am-7pm After 7 pm, please refer to night coverage provider listed on amion.

## 2023-03-16 NOTE — Progress Notes (Addendum)
Transition of Care [facility]) - Inpatient Brief Assessment   Patient Details  Name: Kerri Ford MRN: [id number] Date of Birth: 09/22/1980  [facility]) CM/SW Contact:    Janae Bridgeman, RN Phone Number: 03/16/2023, 3:03 PM   Clinical Narrative: Cm met with the patient at the bedside and provided her with choice regarding Outpatient therapy services.  Referral was placed through the hub for [facility] location - to be co-signed by the MD.  Patient updated that her [facility] Medicare would not cover the cost of the cane and that she can purchase from [employer] [employer] for [place] for ambulation.  No other TOC needs at this time.   Transition of Care Asessment: Insurance and Status: (P) Insurance coverage has been reviewed Patient has primary care physician: (P) Yes Home environment has been reviewed: (P) from home with roommate Prior level of function:: (P) Independent Prior/Current Home Services: (P) No current home services Social Determinants of Health Reivew: (P) SDOH reviewed interventions complete Readmission risk has been reviewed: (P) Yes Transition of care needs: (P) transition of care needs identified, TOC will continue to follow

## 2023-03-16 NOTE — Inpatient Diabetes Management (Addendum)
Inpatient Diabetes Program Recommendations  AACE/ADA: New Consensus Statement on Inpatient Glycemic Control (2015)  Target Ranges:  Prepandial:   less than 140 mg/dL      Peak postprandial:   less than 180 mg/dL (1-2 hours)      Critically ill patients:  140 - 180 mg/dL   Lab Results  Component Value Date   GLUCAP 275 (H) 03/16/2023   HGBA1C 8.5 (H) 03/16/2023    Latest Reference Range & Units 03/15/23 21:46 03/16/23 08:31  Glucose-Capillary 70 - 99 mg/dL 161 (H) 096 (H)  (H): Data is abnormally high  Review of Glycemic Control  Diabetes history: DM2 Outpatient Diabetes medications: Glucotrol 5 mg bid, Metformin 1 gm bid, Mounjaro 10 mg weekly, Actos 45 mg daily Current orders for Inpatient glycemic control: Novolog 0-9 units tid, 0-5 units hs correction  Inpatient Diabetes Program Recommendations:   Noted Semglee 13 units daily and Novolog 5 units tid meal coverage listed on patient's home meds from admitting MD. Not listed on home medications. Sent [place] to RN to check with patient. Unable to reach patient via phone to verify if patient is taking. RN discussed with patient and patient states she has not been taking insulin doses. Please consider increase in Novolog correction to 0-15 units tid, 0-5 units hs  Will follow while hospitalized.  Thank you, Billy Fischer. Mkenzie Dotts, RN, MSN, CDCES  Diabetes Coordinator Inpatient Glycemic Control Team Team Pager [phone] (8am-5pm) 03/16/2023 11:34 AM

## 2023-03-16 NOTE — Evaluation (Signed)
Physical Therapy Evaluation Patient Details Name: Kerri Ford MRN: [id number] DOB: 1980/11/09 Today's Date: 03/16/2023  History of Present Illness  42 y.o. female presents to [facility] hospital on 03/15/2023 with back pain. MRI reveals L4-5 disc protrusion, likely contacting L4 nerve roots. PMH includes bipolar disorder, spinal stenosis, OSA, fibromyalgia, migraines, DMII, PTSD.  Clinical Impression  Pt received in supine and agreeable to PT session. The pt reports to PT with deficits in strength, mobility, and activity tolerance. The pt was able to ambulate household distances with no AD and supervision. The pt reports feeling R knee hyperextension with ambulation causing unsteadiness on feet. Following, pt ambulated a short distances with the use of a cane leading to pt reporting resolution of unsteadiness on feet. The pt attempted stair training with the use of a cane and L sided rail; however, Pt had a LOB while turning on step to descend the stairs, the pt was unable to recover balance with use of railing and assist of student PT, pt was lowered into a seated position on the nearest step. Pt reports no new injury or increased pain. Nurse and doctor notified. PT recommends outpatient physical therapy following hospitalization to address problem list below. The pt will continue to benefit from acute skilled PT to address remaining functional deficits.       If plan is discharge home, recommend the following: A little help with walking and/or transfers;Assistance with cooking/housework;Assist for transportation;Help with stairs or ramp for entrance   Can travel by private vehicle        Equipment Recommendations Cane  Recommendations for Other Services       Functional Status Assessment Patient has had a recent decline in their functional status and demonstrates the ability to make significant improvements in function in a reasonable and predictable amount of time.     Precautions /  Restrictions Precautions Precautions: Fall Restrictions Weight Bearing Restrictions: No      Mobility  Bed Mobility Overal bed mobility: Modified Independent (used bed rails)                  Transfers Overall transfer level: Needs assistance Equipment used: None Transfers: Sit to/from Stand Sit to Stand: Supervision                Ambulation/Gait Ambulation/Gait assistance: Supervision Gait Distance (Feet): 150 Feet (+ 61ft with use of a cane) Assistive device: None Gait Pattern/deviations: Step-through pattern, Decreased stride length (Pt reports feeling like R knee hyperextends with ambulation.) Gait velocity: decreased Gait velocity interpretation: <1.8 ft/sec, indicate of risk for recurrent falls   General Gait Details: Pt reports feeling like R knee hyperextends with ambulation.  Stairs Stairs: Yes Stairs assistance: Contact guard assist Stair Management: One rail Left, Forwards, With cane Number of Stairs: 6 Stair Comments: Pt had a LOB while turning on step to begin descending stairs, the patient was unable to correct this with use of railing and assist of student PT, pt was lowered into a seated position on the nearest step. The pt reported feeling like her RLE gave out. No new reports of an injury or pain.     Wheelchair Mobility     Tilt Bed    Modified Rankin (Stroke Patients Only)       Balance Overall balance assessment: Independent Sitting-balance support: No upper extremity supported, Feet supported Sitting balance-Leahy Scale: Normal     Standing balance support: No upper extremity supported, During functional activity Standing balance-Leahy Scale: Normal  Pertinent Vitals/Pain Pain Assessment Pain Assessment: 0-10 Pain Score: 4  Pain Location: Low back Pain Descriptors / Indicators: Grimacing Pain Intervention(s): Monitored during session    Home Living Family/patient expects to  be discharged to:: Private residence Living Arrangements: Non-relatives/Friends Available Help at Discharge: Friend(s);Available PRN/intermittently Type of Home: Apartment Home Access: Stairs to enter Entrance Stairs-Rails: None;Left (3 exterior steps have no rail, 6 interior steps has a rail on the left) Entrance Stairs-Number of Steps: 3+6 (3 steps on the outside and 6 interior steps to enter apt)   Home Layout: One level Home Equipment: None (Simultaneous filing. User may not have seen previous data.)      Prior Function Prior Level of Function : Working/employed;Driving;Needs assist             Mobility Comments: Pt ambulated without assistance prior to hospitalization ADLs Comments: Pt reports requiring assistance with bringing groceries into the apartment     Extremity/Trunk Assessment   Upper Extremity Assessment Upper Extremity Assessment: Overall WFL for tasks assessed    Lower Extremity Assessment Lower Extremity Assessment: Overall WFL for tasks assessed    Cervical / Trunk Assessment Cervical / Trunk Assessment: Normal  Communication   Communication Communication: No apparent difficulties Cueing Techniques: Verbal cues;Visual cues;Tactile cues  Cognition Arousal: Alert Behavior During Therapy: WFL for tasks assessed/performed Overall Cognitive Status: Within Functional Limits for tasks assessed                                          General Comments General comments (skin integrity, edema, etc.): Pt reports no new injury following unplanned loss of balance on stairs. RN and MD made aware of incident.    Exercises     Assessment/Plan    PT Assessment Patient needs continued PT services  PT Problem List Decreased strength;Decreased activity tolerance;Decreased balance;Decreased mobility;Pain       PT Treatment Interventions DME instruction;Gait training;Stair training;Functional mobility training;Therapeutic activities;Therapeutic  exercise;Balance training    PT Goals (Current goals can be found in the Care Plan section)  Acute Rehab PT Goals Patient Stated Goal: To feel better PT Goal Formulation: With patient Time For Goal Achievement: 03/30/23 Potential to Achieve Goals: Good    Frequency Min 1X/week     Co-evaluation               AM-PAC PT "6 Clicks" Mobility  Outcome Measure Help needed turning from your back to your side while in a flat bed without using bedrails?: None Help needed moving from lying on your back to sitting on the side of a flat bed without using bedrails?: None Help needed moving to and from a bed to a chair (including a wheelchair)?: None Help needed standing up from a chair using your arms (e.g., wheelchair or bedside chair)?: None Help needed to walk in hospital room?: A Little Help needed climbing 3-5 steps with a railing? : A Lot 6 Click Score: 21    End of Session   Activity Tolerance: Patient tolerated treatment well Patient left: in bed;with call bell/phone within reach;with bed alarm set Nurse Communication: Mobility status;Other (comment) (Nurse notified about incident on the stairs) PT Visit Diagnosis: Unsteadiness on feet (R26.81);Muscle weakness (generalized) (M62.81);Pain Pain - part of body:  (low back)    Time: 1030-1107 PT Time Calculation (min) (ACUTE ONLY): 37 min   Charges:   PT Evaluation $PT Eval Low Complexity:  1 Low PT Treatments $Gait Training: 8-22 mins PT General Charges $$ ACUTE PT VISIT: 1 Visit         Caryl Comes, SPT Acute Rehabilitation Office Phone [phone]   Caryl Comes 03/16/2023, 1:09 PM

## 2023-03-16 NOTE — Progress Notes (Signed)
   03/16/23 1500  Spiritual Encounters  Type of Visit Initial  Care provided to: Patient  Referral source Patient request  Reason for visit Advance directives  OnCall Visit No  Spiritual Framework  Presenting Themes Meaning/purpose/sources of inspiration  Values/beliefs Faith  Community/Connection Faith community;Friend(s)  Patient Stress Factors Health changes;Family relationships;Financial concerns;Loss  Interventions  Spiritual Care Interventions Made Established relationship of care and support;Compassionate presence;Reflective listening;Normalization of emotions;Meaning making  Intervention Outcomes  Outcomes Connection to spiritual care;Awareness of support;Reduced fear;Reduced isolation   Chaplain responded to request for AD. Patient did not remember ordering AD but chaplain left a copy of AD with her. Patient has been going through emotional distress because of the pain in her back. She is concerning the pain will cause her to lose her job. She currently sharing an apartment with a friend. She is a great support for the patient. [place] have had suicidal thoughts on many occasions during her life. This is how she tried to cope with the pain. Her grandfather had the same problem with pain in his back. He committed suicide. She said that her faith forbade such an act. Her faith is important to her, it is the source of strength and comfort. Patient has not experienced suicidal thoughts while at [facility] Last night, she said her pain was unbearable but she is feeling better today. Chaplain provided a non-anxious presence and helped patient process her thoughts and feelings. Chaplain remains available when needed.

## 2023-03-17 DIAGNOSIS — M549 Dorsalgia, unspecified: Secondary | ICD-10-CM | POA: Diagnosis not present

## 2023-03-17 DIAGNOSIS — M5136 Other intervertebral disc degeneration, lumbar region with discogenic back pain only: Secondary | ICD-10-CM | POA: Diagnosis not present

## 2023-03-17 DIAGNOSIS — Z8639 Personal history of other endocrine, nutritional and metabolic disease: Secondary | ICD-10-CM | POA: Diagnosis not present

## 2023-03-17 DIAGNOSIS — E119 Type 2 diabetes mellitus without complications: Secondary | ICD-10-CM

## 2023-03-17 DIAGNOSIS — M5441 Lumbago with sciatica, right side: Secondary | ICD-10-CM | POA: Diagnosis not present

## 2023-03-17 LAB — BASIC METABOLIC PANEL
Anion gap: 11 (ref 5–15)
BUN: 10 mg/dL (ref 6–20)
CO2: 24 mmol/L (ref 22–32)
Calcium: 9.2 mg/dL (ref 8.9–10.3)
Chloride: 99 mmol/L (ref 98–111)
Creatinine, Ser: 0.71 mg/dL (ref 0.44–1.00)
GFR, Estimated: >60 mL/min (ref >60)
Glucose, Bld: 267 mg/dL — ABNORMAL HIGH (ref 70–99)
Potassium: 4.2 mmol/L (ref 3.5–5.1)
Sodium: 134 mmol/L — ABNORMAL LOW (ref 135–145)

## 2023-03-17 LAB — GLUCOSE, CAPILLARY
Glucose-Capillary: 276 mg/dL — ABNORMAL HIGH (ref 70–99)
Glucose-Capillary: 272 mg/dL — ABNORMAL HIGH (ref 70–99)

## 2023-03-17 MED ORDER — GABAPENTIN 100 MG PO CAPS: 100.0000 mg | ORAL_CAPSULE | Freq: Three times a day (TID) | ORAL | 1 refills | Status: AC

## 2023-03-17 MED ORDER — OXYCODONE HCL 5 MG PO TABS: 5.0000 mg | ORAL_TABLET | Freq: Four times a day (QID) | ORAL | 0 refills | Status: AC | PRN

## 2023-03-17 MED ORDER — METFORMIN HCL 500 MG PO TABS: 1000.0000 mg | ORAL_TABLET | Freq: Two times a day (BID) | ORAL | 0 refills | Status: AC

## 2023-03-17 MED ORDER — POLYETHYLENE GLYCOL 3350 17 G PO PACK: 17.0000 g | PACK | Freq: Every day | ORAL | 0 refills | Status: AC | PRN

## 2023-03-17 MED ORDER — CYCLOBENZAPRINE HCL 10 MG PO TABS: 10.0000 mg | ORAL_TABLET | Freq: Two times a day (BID) | ORAL | 0 refills | Status: AC | PRN

## 2023-03-17 MED ORDER — CYANOCOBALAMIN 1000 MCG PO TABS: 1000.0000 ug | ORAL_TABLET | Freq: Every day | ORAL | 3 refills | Status: AC

## 2023-03-17 MED ORDER — CLONAZEPAM 0.5 MG PO TABS: 0.2500 mg | ORAL_TABLET | ORAL | 0 refills | Status: AC | PRN

## 2023-03-17 MED ORDER — PREDNISONE 10 MG (21) PO TBPK: ORAL_TABLET | Freq: Every day | ORAL | 0 refills | Status: AC

## 2023-03-17 NOTE — Plan of Care (Signed)
  Problem: Education: Goal: Ability to describe self-care measures that may prevent or decrease complications (Diabetes Survival Skills Education) will improve Outcome: Progressing Goal: Individualized Educational Video(s) Outcome: Progressing   Problem: Coping: Goal: Ability to adjust to condition or change in health will improve Outcome: Progressing   Problem: Fluid Volume: Goal: Ability to maintain a balanced intake and output will improve Outcome: Progressing   Problem: Health Behavior/Discharge Planning: Goal: Ability to identify and utilize available resources and services will improve Outcome: Progressing Goal: Ability to manage health-related needs will improve Outcome: Progressing   Problem: Metabolic: Goal: Ability to maintain appropriate glucose levels will improve Outcome: Progressing   Problem: Nutritional: Goal: Maintenance of adequate nutrition will improve Outcome: Progressing Goal: Progress toward achieving an optimal weight will improve Outcome: Progressing   Problem: Skin Integrity: Goal: Risk for impaired skin integrity will decrease Outcome: Progressing   Problem: Tissue Perfusion: Goal: Adequacy of tissue perfusion will improve Outcome: Progressing   Problem: Education: Goal: Knowledge of General Education information will improve Description: Including pain rating scale, medication(s)/side effects and non-pharmacologic comfort measures Outcome: Progressing   Problem: Health Behavior/Discharge Planning: Goal: Ability to manage health-related needs will improve Outcome: Progressing   Problem: Clinical Measurements: Goal: Ability to maintain clinical measurements within normal limits will improve Outcome: Progressing Goal: Will remain free from infection Outcome: Progressing Goal: Diagnostic test results will improve Outcome: Progressing Goal: Respiratory complications will improve Outcome: Progressing Goal: Cardiovascular complication will  be avoided Outcome: Progressing   Problem: Activity: Goal: Risk for activity intolerance will decrease Outcome: Progressing   Problem: Nutrition: Goal: Adequate nutrition will be maintained Outcome: Progressing   Problem: Coping: Goal: Level of anxiety will decrease Outcome: Progressing   Problem: Elimination: Goal: Will not experience complications related to bowel motility Outcome: Progressing Goal: Will not experience complications related to urinary retention Outcome: Progressing

## 2023-03-17 NOTE — Discharge Summary (Signed)
Physician Discharge Summary   Patient: Kerri Ford MRN: [id number] DOB: November 16, 1980  Admit date:     03/15/2023  Discharge date: 03/17/23  Discharge Physician: Thad Ranger, MD    PCP: Sherren Mocha, MD   Recommendations at discharge:   Started on Flexeril 10 mg p.o. twice daily as needed Prednisone pack with taper, take as directed on the pack Gabapentin 100 mg p.o. 3 times daily Oxycodone 5 mg p.o. every 6 hours as needed for moderate or severe pain Patient needs to follow-up with Dr. Wynetta Emery in 2-3 weeks  Discharge Diagnoses:   Intractable back pain, acute on chronic    Severe obesity (BMI >= 40) (HCC)   Mixed hyperlipidemia   Type 2 diabetes mellitus without complication, without long-term current use of insulin (HCC)   OSA (obstructive sleep apnea)   Lumbar degenerative disc disease   Hyperlipidemia associated with type 2 diabetes mellitus [facility])   Hospital Course:  Patient is a 42 year old female with bipolar disorder, spinal stenosis and lumbosacral region, OSA on CPAP, fibromyalgia, migraines presented to ED with intractable back pain after twisting her back.  Immediately felt a pop.  She was in her usual state of health prior to this.  Presented to the ED due to severe back pain, radiating to her right lower extremity.  In the ED, she received IV opioid-based analgesics.  Afebrile no leukocytosis.  MRI lumbar spine revealed L4-L5 right extreme lateral protrusion which likely contacts the existing right L4 nerve roots.  The patient was unable to get her pain controlled in the ED and was unable to ambulate.  EDP requested admission for pain control.   Assessment and Plan:   Intractable back pain with history of lumbar degenerative disc disease -Patient presented with intractable back pain after twisting her back and radiculopathy, unable to ambulate.  Patient has a history of lumbar DJD, followed by neurosurgery outpatient, Dr. Wynetta Emery -Pain was uncontrolled despite multiple  pain medications in the ED, hence was admitted  -EDP discussed with neurosurgery, recommended pain control, steroids, gabapentin.  If pain is controlled, can be discharged and follow-up outpatient in office. -MRI lumbar spine showed L4-L5 right extreme lateral protrusion which likely contacts the exiting right L4 nerve roots. -Pain significantly improved with IV Decadron, gabapentin, pain medication with IV Dilaudid as needed and oxycodone.   -Patient did well with PT and was recommended outpatient PT  -Placed on prednisone pack, gabapentin 100 mg 3 times daily, Flexeril 10 mg p.o. twice daily as needed, oxycodone as needed for moderate or severe pain.      Mixed hyperlipidemia -Continue Lipitor     Type 2 diabetes mellitus without complication, without long-term current use of insulin (HCC) -Outpatient on Glucotrol, metformin, Mounjaro, Actos, continue -Hemoglobin A1c 8.5      OSA (obstructive sleep apnea) -Continue O2   History of bipolar disorder -Currently stable, continue lumateperone       Morbid obesity Estimated body mass index is 54.07 kg/m as calculated from the following:   Height as of this encounter: 5\' 4"  (1.626 m).   Weight as of this encounter: 142.9 kg.      Pain control - [employer] Controlled Substance Reporting System database was reviewed. and patient was instructed, not to drive, operate heavy machinery, perform activities at heights, swimming or participation in water activities or provide baby-sitting services while on Pain, Sleep and Anxiety Medications; until their outpatient Physician has advised to do so again. Also recommended to not to take more than  prescribed Pain, Sleep and Anxiety Medications.  Consultants: EDP discussed with neurosurgery Procedures performed: None Disposition: Home Diet recommendation:  Discharge Diet Orders (From admission, onward)     Start     Ordered   03/17/23 0000  Diet Carb Modified        03/17/23 1239             DISCHARGE MEDICATION: Allergies as of 03/17/2023       Reactions   Tioconazole Itching, Other (See Comments)   Topical irritation; tolerates oral fluconazole.   Latex Itching, Cough   Aspartame And Phenylalanine Other (See Comments)   Inflamed esophagus        Medication List     TAKE these medications    albuterol 108 (90 Base) MCG/ACT inhaler Commonly known as: VENTOLIN HFA Inhale 1-2 puffs into the lungs every 4 (four) hours as needed for wheezing or shortness of breath. Reported on 10/11/2015   atorvastatin 20 MG tablet Commonly known as: LIPITOR Take 20 mg by mouth daily.   B COMPLEX VITAMINS PLUS PO Take 1 capsule by mouth in the morning.   Caplyta 42 MG capsule Generic drug: lumateperone tosylate Take 42 mg by mouth in the morning.   Cholecalciferol 50 MCG (2000 UT) Caps Take 1 capsule by mouth in the morning and at bedtime.   clonazePAM 0.5 MG tablet Commonly known as: KLONOPIN Take 0.5 tablets (0.25 mg total) by mouth as needed for anxiety.   cyanocobalamin 1000 MCG tablet Take 1 tablet (1,000 mcg total) by mouth daily. Start taking on: March 18, 2023 What changed:  medication strength how much to take   cyclobenzaprine 10 MG tablet Commonly known as: FLEXERIL Take 1 tablet (10 mg total) by mouth 2 (two) times daily as needed for muscle spasms. What changed: when to take this   fluconazole 150 MG tablet Commonly known as: DIFLUCAN Take 1 tablet once for yeast infection; may repeat once in 3 days if needed   gabapentin 100 MG capsule Commonly known as: NEURONTIN Take 1 capsule (100 mg total) by mouth 3 (three) times daily.   glipiZIDE 5 MG tablet Commonly known as: GLUCOTROL Take 1 tablet (5 mg total) by mouth 2 (two) times daily before a meal. What changed: when to take this   levocetirizine 5 MG tablet Commonly known as: XYZAL Take 5 mg by mouth in the morning.   MAGNESIUM GLYCINATE PLUS PO Take 1 capsule by mouth at bedtime.    metFORMIN 500 MG tablet Commonly known as: GLUCOPHAGE Take 2 tablets (1,000 mg total) by mouth 2 (two) times daily with a meal.   Mounjaro 10 MG/0.5ML Pen Generic drug: tirzepatide Inject 10 mg into the skin once a week. Inject on Monday   naproxen sodium 220 MG tablet Commonly known as: ALEVE Take 440 mg by mouth as needed.   oxyCODONE 5 MG immediate release tablet Commonly known as: Oxy IR/ROXICODONE Take 1 tablet (5 mg total) by mouth every 6 (six) hours as needed for moderate pain (pain score 4-6) or severe pain (pain score 7-10).   pioglitazone 45 MG tablet Commonly known as: ACTOS Take 1 tablet (45 mg total) by mouth daily.   polyethylene glycol 17 g packet Commonly known as: MIRALAX / GLYCOLAX Take 17 g by mouth daily as needed for mild constipation. Also available over-the-counter   Potassium 99 MG Tabs Take 1 tablet by mouth at bedtime.   predniSONE 10 MG (21) Tbpk tablet Commonly known as: STERAPRED UNI-PAK 21 TAB  Take by mouth daily. Take as directed on the pack   QC Tumeric Complex 500 MG Caps Generic drug: Turmeric Take 1 capsule by mouth in the morning.        Follow-up Information     [facility]. Call.   Specialty: Rehabilitation Why: Please call the center and follow up regarding OUtpatient therapy needs. Contact information: [address] [facility] [place] [address] [phone]        Donalee Citrin, MD. Schedule an appointment as soon as possible for a visit in 2 week(s).   Specialty: Neurosurgery Why: for hospital follow-up.  Referral sent, please call on Monday to make an appointment.  You may need to request PCP to send the referral as well. Contact information: [address] [place] [address] [phone]         Sherren Mocha, MD. Schedule an appointment as soon as possible for a visit in 2 week(s).   Specialty: Family Medicine Why: for hospital  follow-up Contact information: [address] [place] [address] [phone]                Discharge Exam: Ceasar Mons Weights   03/15/23 1011  Weight: (!) 142.9 kg   S: Feels a lot better today, working with PT, plans to go home today.  BP (!) 111/58 (BP Location: Right Arm)   Pulse 89   Temp 98.3 F (36.8 C) (Oral)   Resp 18   Ht 5\' 4"  (1.626 m)   Wt (!) 142.9 kg   SpO2 100%   BMI 54.07 kg/m   Physical Exam General: Alert and oriented x 3, NAD Cardiovascular: S1 S2 clear, RRR.  Respiratory: CTAB, no wheezing Gastrointestinal: Soft, nontender, nondistended, NBS Ext: no pedal edema bilaterally Neuro: no new deficits Psych: Normal affect, pleasant  Condition at discharge: fair  The results of significant diagnostics from this hospitalization (including imaging, microbiology, ancillary and laboratory) are listed below for reference.   Imaging Studies: MR LUMBAR SPINE W WO CONTRAST  Result Date: 03/15/2023 CLINICAL DATA:  Low back pain, prior surgery, new symptoms EXAM: MRI LUMBAR SPINE WITHOUT AND WITH CONTRAST TECHNIQUE: Multiplanar and multiecho pulse sequences of the lumbar spine were obtained without and with intravenous contrast. CONTRAST:  10mL GADAVIST GADOBUTROL 1 MMOL/ML IV SOLN COMPARISON:  04/28/2018 MRI lumbar spine, correlation is made with 03/15/2023 CT lumbar spine. FINDINGS: Segmentation:  5 lumbar type vertebral bodies. Alignment: No significant listhesis. Preservation of the normal lumbar lordosis. Vertebrae: No acute fracture, evidence of discitis, or suspicious osseous lesion. T1 and T2 hyperintense foci in the vertebral bodies, most likely benign hemangiomas. No abnormal enhancement. Conus medullaris and cauda equina: Conus extends to the L1 level. Conus and cauda equina appear normal. No abnormal enhancement. Paraspinal and other soft tissues: No acute finding. Disc levels: T12-L1: No significant disc bulge. No spinal canal stenosis or neural  foraminal narrowing. L1-L2: No significant disc bulge. No spinal canal stenosis or neural foraminal narrowing. L2-L3: No significant disc bulge. No spinal canal stenosis or neural foraminal narrowing. L3-L4: Minimal disc bulge. Mild facet arthropathy. No spinal canal stenosis or neural foraminal narrowing. L4-L5: Mild disc bulge with right extreme lateral protrusion, which likely contacts the exiting right L4 nerve roots. Mild left greater than right facet arthropathy. No spinal canal stenosis or neural foraminal narrowing. L5-S1: Small central disc protrusion. Mild facet arthropathy. No spinal canal stenosis or neural foraminal narrowing. IMPRESSION: 1. L4-L5 right extreme lateral  protrusion, which likely contacts the exiting right L4 nerve roots. 2. No spinal canal stenosis or neural foraminal narrowing. 3. Mild facet arthropathy in the lower lumbar spine. Electronically Signed   By: Wiliam Ke M.D.   On: 03/15/2023 19:11   CT Lumbar Spine Wo Contrast  Result Date: 03/15/2023 CLINICAL DATA:  Lower back pain.  Increased fracture risk. EXAM: CT LUMBAR SPINE WITHOUT CONTRAST TECHNIQUE: Multidetector CT imaging of the lumbar spine was performed without intravenous contrast administration. Multiplanar CT image reconstructions were also generated. RADIATION DOSE REDUCTION: This exam was performed according to the departmental dose-optimization program which includes automated exposure control, adjustment of the mA and/or kV according to patient size and/or use of iterative reconstruction technique. COMPARISON:  CT abdomen and pelvis without contrast 12/09/2016 FINDINGS: Segmentation: There are 5 non-rib-bearing lumbar-type vertebral bodies. Alignment: No sagittal spondylolisthesis normal frontal alignment. Vertebrae: Vertebral body heights are maintained. Moderate of S1 disc space narrowing. L5-S1 and L4-5 endplate sclerosis and degenerative vacuum phenomenon. Degenerative Schmorl's nodes greatest at T11-12,  T12-L1, L2-3, and L5-S1. No acute fracture. Paraspinal and other soft tissues: Incidental note of two retroaortic left renal veins which converge prior to the renal hilum. Disc levels: T12-L1: Minimal left intraforaminal endplate spurring. No significant neuroforaminal stenosis. No central canal stenosis. L1-2: Mild bilateral facet joint hypertrophy. No definite posterior disc bulge. No central canal or neuroforaminal stenosis. L2-3: Mild bilateral facet joint hypertrophy. No definite posterior disc bulge. Mild right intraforaminal endplate spurring. No definite central canal or neuroforaminal stenosis. L3-4: Mild bilateral facet joint hypertrophy. Mild right intraforaminal endplate spurring. No significant neuroforaminal stenosis. No central canal stenosis. L4-5: Moderate left-greater-than-right facet joint hypertrophy. Mild broad-based posterior disc osteophyte complex with moderate bilateral intraforaminal disc osteophyte extension. Moderate left and mild right neuroforaminal stenosis. Narrowing of the left lateral recess. Mild osseous central canal stenosis. L5-S1: Mild bilateral facet joint hypertrophy. Mild-to-moderate broad-based posterior disc osteophyte complex with moderate bilateral intraforaminal extension. Moderate bilateral neuroforaminal stenosis. Mild narrowing of the lateral recesses and mild central canal stenosis. IMPRESSION: 1. No acute fracture. 2. Multilevel degenerative disc and joint changes as above. 3. Moderate left and mild right neuroforaminal stenosis at L4-5. Narrowing of the left lateral recess at L4-5. 4. Moderate bilateral neuroforaminal stenosis at L5-S1. Mild narrowing of the lateral recesses and mild central canal stenosis at L5-S1. Electronically Signed   By: Neita Garnet M.D.   On: 03/15/2023 15:17    Microbiology: Results for orders placed or performed during the hospital encounter of 09/18/21  SARS Coronavirus 2 by RT PCR (hospital order, performed in [facility] hospital  lab) *cepheid single result test* Anterior Nasal Swab     Status: None   Collection Time: 09/18/21  9:17 AM   Specimen: Anterior Nasal Swab  Result Value Ref Range Status   SARS Coronavirus 2 by RT PCR NEGATIVE NEGATIVE Final    Comment: (NOTE) SARS-CoV-2 target nucleic acids are NOT DETECTED.  The SARS-CoV-2 RNA is generally detectable in upper and lower respiratory specimens during the acute phase of infection. The lowest concentration of SARS-CoV-2 viral copies this assay can detect is 250 copies / mL. A negative result does not preclude SARS-CoV-2 infection and should not be used as the sole basis for treatment or other patient management decisions.  A negative result may occur with improper specimen collection / handling, submission of specimen other than nasopharyngeal swab, presence of viral mutation(s) within the areas targeted by this assay, and inadequate number of viral copies (<250 copies /  mL). A negative result must be combined with clinical observations, patient history, and epidemiological information.  Fact Sheet for Patients:   RoadLapTop.co.za  Fact Sheet for Healthcare Providers: http://kim-miller.com/  This test is not yet approved or  cleared by the [place] FDA and has been authorized for detection and/or diagnosis of SARS-CoV-2 by FDA under an Emergency Use Authorization (EUA).  This EUA will remain in effect (meaning this test can be used) for the duration of the COVID-19 declaration under Section 564(b)(1) of the Act, 21 U.S.C. section 360bbb-3(b)(1), unless the authorization is terminated or revoked sooner.  Performed at [facility], [address], [place], [place] [address]   Resp Panel by RT-PCR (Flu A&B, Covid) Anterior Nasal Swab     Status: None   Collection Time: 09/18/21 10:37 AM   Specimen: Anterior Nasal Swab  Result Value Ref Range Status   SARS Coronavirus 2 by RT PCR NEGATIVE  NEGATIVE Final    Comment: (NOTE) SARS-CoV-2 target nucleic acids are NOT DETECTED.  The SARS-CoV-2 RNA is generally detectable in upper respiratory specimens during the acute phase of infection. The lowest concentration of SARS-CoV-2 viral copies this assay can detect is 138 copies/mL. A negative result does not preclude SARS-Cov-2 infection and should not be used as the sole basis for treatment or other patient management decisions. A negative result may occur with  improper specimen collection/handling, submission of specimen other than nasopharyngeal swab, presence of viral mutation(s) within the areas targeted by this assay, and inadequate number of viral copies(<138 copies/mL). A negative result must be combined with clinical observations, patient history, and epidemiological information. The expected result is Negative.  Fact Sheet for Patients:  BloggerCourse.com  Fact Sheet for Healthcare Providers:  SeriousBroker.it  This test is no t yet approved or cleared by the [place] FDA and  has been authorized for detection and/or diagnosis of SARS-CoV-2 by FDA under an Emergency Use Authorization (EUA). This EUA will remain  in effect (meaning this test can be used) for the duration of the COVID-19 declaration under Section 564(b)(1) of the Act, 21 U.S.C.section 360bbb-3(b)(1), unless the authorization is terminated  or revoked sooner.       Influenza A by PCR NEGATIVE NEGATIVE Final   Influenza B by PCR NEGATIVE NEGATIVE Final    Comment: (NOTE) The Xpert Xpress SARS-CoV-2/FLU/RSV plus assay is intended as an aid in the diagnosis of influenza from Nasopharyngeal swab specimens and should not be used as a sole basis for treatment. Nasal washings and aspirates are unacceptable for Xpert Xpress SARS-CoV-2/FLU/RSV testing.  Fact Sheet for Patients: BloggerCourse.com  Fact Sheet for Healthcare  Providers: SeriousBroker.it  This test is not yet approved or cleared by the [place] FDA and has been authorized for detection and/or diagnosis of SARS-CoV-2 by FDA under an Emergency Use Authorization (EUA). This EUA will remain in effect (meaning this test can be used) for the duration of the COVID-19 declaration under Section 564(b)(1) of the Act, 21 U.S.C. section 360bbb-3(b)(1), unless the authorization is terminated or revoked.  Performed at [facility], [address] [place], [place] [address]     Labs: CBC: Recent Labs  Lab 03/15/23 1017 03/16/23 0532  WBC 9.7 7.4  NEUTROABS 7.7  --   HGB 13.1 13.1  HCT 40.6 39.2  MCV 89.6 87.9  PLT 332 299   Basic Metabolic Panel: Recent Labs  Lab 03/15/23 1017 03/16/23 0532 03/17/23 0654  NA 136 131* 134*  K 4.1  4.0 4.2  CL 100 102 99  CO2 24 20* 24  GLUCOSE 210* 287* 267*  BUN 11 10 10   CREATININE 0.92 0.62 0.71  CALCIUM 9.7 8.9 9.2  MG  --  1.9  --   PHOS  --  3.2  --    Liver Function Tests: Recent Labs  Lab 03/15/23 1017  AST 22  ALT 32  ALKPHOS 55  BILITOT 0.5  PROT 7.2  ALBUMIN 3.9   CBG: Recent Labs  Lab 03/16/23 0831 03/16/23 1252 03/16/23 2007 03/17/23 0752 03/17/23 1145  GLUCAP 275* 281* 310* 276* 272*    Discharge time spent: greater than 30 minutes.  Signed: Thad Ranger, MD [facility] 03/17/2023

## 2023-03-17 NOTE — Progress Notes (Signed)
Physical Therapy Treatment Patient Details Name: Kerri Ford MRN: [id number] DOB: 03-Mar-1981 Today's Date: 03/17/2023   History of Present Illness 42 y.o. female admitted 03/15/2023 with back pain. MRI reveals L4-5 disc protrusion, likely contacting L4 nerve roots. PMH includes bipolar disorder, spinal stenosis, OSA, fibromyalgia, migraines, DMII, PTSD.   PT Comments  Pt progressing with mobility. Pt indep with transfers and ambulation, declined additional gait trial with SPC. Pt preparing for d/c home today; reviewed all education, including stairs, therex/activity recommendations, DME needs. If to remain admitted, will continue to follow acutely to address established goals.      If plan is discharge home, recommend the following: Assist for transportation   Can travel by private vehicle      Yes  Equipment Recommendations  None recommended by PT (pt already purchased)    Recommendations for Other Services       Precautions / Restrictions Precautions Precautions: Fall;Back Precaution Comments: spinal prec for comfort Restrictions Weight Bearing Restrictions: No     Mobility  Bed Mobility Overal bed mobility: Independent             General bed mobility comments: indep rolling and sidelying>sit with bed flat    Transfers Overall transfer level: Independent Equipment used: None Transfers: Sit to/from [employer] transfer comment: repeated sit<>stand from EOb indep without DME    Ambulation/Gait Ambulation/Gait assistance: Independent Gait Distance (Feet): 100 Feet Assistive device: None Gait Pattern/deviations: Step-through pattern, Decreased stride length Gait velocity: Decreased     General Gait Details: slow, guarded gait indep without DME; pt declines SPC use. no overt instability or LOB noted   Stairs Stairs:  (pt declines concern for formal stair training; reviewed educ on sequencing in preparation for d/c home today; pt plans to  use cane)           Wheelchair Mobility     Tilt Bed    Modified Rankin (Stroke Patients Only)       Balance Overall balance assessment: No apparent balance deficits (not formally assessed) Sitting-balance support: No upper extremity supported, Feet supported Sitting balance-Leahy Scale: Normal Sitting balance - Comments: indep to don socks/shoes sitting EOB   Standing balance support: No upper extremity supported, During functional activity Standing balance-Leahy Scale: Good                              Cognition Arousal: Alert Behavior During Therapy: WFL for tasks assessed/performed Overall Cognitive Status: Within Functional Limits for tasks assessed                                          Exercises Other Exercises Other Exercises: 5x repeated sit<>stands (without UE support, slow eccentric lowering), standing calf raises (UE support on sink)    General Comments General comments (skin integrity, edema, etc.): pt states roommate has purchased bariatric cane and shower seat for her to use at home      Pertinent Vitals/Pain Pain Assessment Pain Assessment: Faces Faces Pain Scale: No hurt Pain Intervention(s): Monitored during session    Home Living                          Prior Function  PT Goals (current goals can now be found in the care plan section) Progress towards PT goals: Progressing toward goals    Frequency    Min 1X/week      PT Plan      Co-evaluation              AM-PAC PT "6 Clicks" Mobility   Outcome Measure  Help needed turning from your back to your side while in a flat bed without using bedrails?: None Help needed moving from lying on your back to sitting on the side of a flat bed without using bedrails?: None Help needed moving to and from a bed to a chair (including a wheelchair)?: None Help needed standing up from a chair using your arms (e.g., wheelchair or  bedside chair)?: None Help needed to walk in hospital room?: None Help needed climbing 3-5 steps with a railing? : A Little 6 Click Score: 23    End of Session   Activity Tolerance: Patient tolerated treatment well Patient left: in bed;with call bell/phone within reach Nurse Communication: Mobility status PT Visit Diagnosis: Unsteadiness on feet (R26.81);Muscle weakness (generalized) (M62.81)     Time: 1610-9604 PT Time Calculation (min) (ACUTE ONLY): 16 min  Charges:    $Gait Training: 8-22 mins PT General Charges $$ ACUTE PT VISIT: 1 Visit                     Ina Homes, PT, DPT Acute Rehabilitation Services  Personal: Secure Chat Rehab Office: [phone]  Malachy Chamber 03/17/2023, 4:18 PM
